# Patient Record
Sex: Male | Born: 1950
Health system: Southern US, Community
[De-identification: ages and names within clinical notes are randomized; demographics above are authoritative.]

## PROBLEM LIST (undated history)

## (undated) DIAGNOSIS — R251 Tremor, unspecified: Secondary | ICD-10-CM

## (undated) DIAGNOSIS — E785 Hyperlipidemia, unspecified: Secondary | ICD-10-CM

## (undated) DIAGNOSIS — N182 Chronic kidney disease, stage 2 (mild): Secondary | ICD-10-CM

## (undated) DIAGNOSIS — I129 Hypertensive chronic kidney disease with stage 1 through stage 4 chronic kidney disease, or unspecified chronic kidney disease: Secondary | ICD-10-CM

## (undated) DIAGNOSIS — I10 Essential (primary) hypertension: Secondary | ICD-10-CM

## (undated) DIAGNOSIS — M19049 Primary osteoarthritis, unspecified hand: Secondary | ICD-10-CM

## (undated) HISTORY — DX: Essential (primary) hypertension: I10

## (undated) HISTORY — PX: EYE SURGERY: SHX253

## (undated) HISTORY — DX: Chronic kidney disease, stage 2 (mild): N18.2

## (undated) HISTORY — DX: Hyperlipidemia, unspecified: E78.5

## (undated) HISTORY — DX: Primary osteoarthritis, unspecified hand: M19.049

## (undated) HISTORY — DX: Hypertensive chronic kidney disease with stage 1 through stage 4 chronic kidney disease, or unspecified chronic kidney disease: I12.9

---

## 2010-10-21 ENCOUNTER — Ambulatory Visit: Payer: Self-pay | Admitting: Gastroenterology

## 2010-10-23 LAB — PATHOLOGY REPORT

## 2013-02-02 ENCOUNTER — Ambulatory Visit: Payer: Self-pay | Admitting: General Practice

## 2013-04-07 ENCOUNTER — Ambulatory Visit: Payer: Self-pay | Admitting: General Practice

## 2013-05-19 DIAGNOSIS — H539 Unspecified visual disturbance: Secondary | ICD-10-CM | POA: Diagnosis not present

## 2013-05-19 DIAGNOSIS — I10 Essential (primary) hypertension: Secondary | ICD-10-CM | POA: Diagnosis not present

## 2013-05-19 DIAGNOSIS — E785 Hyperlipidemia, unspecified: Secondary | ICD-10-CM | POA: Diagnosis not present

## 2013-06-13 DIAGNOSIS — H43819 Vitreous degeneration, unspecified eye: Secondary | ICD-10-CM | POA: Diagnosis not present

## 2013-08-25 DIAGNOSIS — Z1331 Encounter for screening for depression: Secondary | ICD-10-CM | POA: Diagnosis not present

## 2013-08-25 DIAGNOSIS — Z125 Encounter for screening for malignant neoplasm of prostate: Secondary | ICD-10-CM | POA: Diagnosis not present

## 2013-08-25 DIAGNOSIS — I10 Essential (primary) hypertension: Secondary | ICD-10-CM | POA: Diagnosis not present

## 2013-08-25 DIAGNOSIS — E785 Hyperlipidemia, unspecified: Secondary | ICD-10-CM | POA: Diagnosis not present

## 2013-08-29 DIAGNOSIS — Z125 Encounter for screening for malignant neoplasm of prostate: Secondary | ICD-10-CM | POA: Diagnosis not present

## 2013-08-29 DIAGNOSIS — E785 Hyperlipidemia, unspecified: Secondary | ICD-10-CM | POA: Diagnosis not present

## 2013-08-29 DIAGNOSIS — I10 Essential (primary) hypertension: Secondary | ICD-10-CM | POA: Diagnosis not present

## 2014-06-30 DIAGNOSIS — H40003 Preglaucoma, unspecified, bilateral: Secondary | ICD-10-CM | POA: Diagnosis not present

## 2014-09-05 DIAGNOSIS — Z125 Encounter for screening for malignant neoplasm of prostate: Secondary | ICD-10-CM | POA: Diagnosis not present

## 2014-09-05 DIAGNOSIS — E785 Hyperlipidemia, unspecified: Secondary | ICD-10-CM | POA: Diagnosis not present

## 2014-09-05 DIAGNOSIS — R251 Tremor, unspecified: Secondary | ICD-10-CM | POA: Diagnosis not present

## 2014-09-05 DIAGNOSIS — I10 Essential (primary) hypertension: Secondary | ICD-10-CM | POA: Diagnosis not present

## 2014-10-03 DIAGNOSIS — R251 Tremor, unspecified: Secondary | ICD-10-CM | POA: Diagnosis not present

## 2014-10-03 DIAGNOSIS — G4700 Insomnia, unspecified: Secondary | ICD-10-CM | POA: Insufficient documentation

## 2014-10-03 DIAGNOSIS — G250 Essential tremor: Secondary | ICD-10-CM | POA: Insufficient documentation

## 2014-10-03 DIAGNOSIS — G479 Sleep disorder, unspecified: Secondary | ICD-10-CM | POA: Diagnosis not present

## 2015-04-03 DIAGNOSIS — Z23 Encounter for immunization: Secondary | ICD-10-CM | POA: Diagnosis not present

## 2015-04-03 DIAGNOSIS — Z0000 Encounter for general adult medical examination without abnormal findings: Secondary | ICD-10-CM | POA: Diagnosis not present

## 2015-04-04 DIAGNOSIS — R251 Tremor, unspecified: Secondary | ICD-10-CM | POA: Diagnosis not present

## 2015-04-04 DIAGNOSIS — G479 Sleep disorder, unspecified: Secondary | ICD-10-CM | POA: Diagnosis not present

## 2015-04-09 ENCOUNTER — Encounter: Payer: Self-pay | Admitting: Family Medicine

## 2015-04-09 ENCOUNTER — Ambulatory Visit (INDEPENDENT_AMBULATORY_CARE_PROVIDER_SITE_OTHER): Payer: Medicare Other | Admitting: Family Medicine

## 2015-04-09 VITALS — BP 130/80 | HR 64 | Resp 16 | Ht 73.0 in | Wt 177.0 lb

## 2015-04-09 DIAGNOSIS — R251 Tremor, unspecified: Secondary | ICD-10-CM

## 2015-04-09 DIAGNOSIS — Z23 Encounter for immunization: Secondary | ICD-10-CM | POA: Diagnosis not present

## 2015-04-09 DIAGNOSIS — I10 Essential (primary) hypertension: Secondary | ICD-10-CM

## 2015-04-09 DIAGNOSIS — G479 Sleep disorder, unspecified: Secondary | ICD-10-CM

## 2015-04-09 DIAGNOSIS — N183 Chronic kidney disease, stage 3 unspecified: Secondary | ICD-10-CM | POA: Insufficient documentation

## 2015-04-09 DIAGNOSIS — I129 Hypertensive chronic kidney disease with stage 1 through stage 4 chronic kidney disease, or unspecified chronic kidney disease: Secondary | ICD-10-CM | POA: Insufficient documentation

## 2015-04-09 MED ORDER — HYDROCHLOROTHIAZIDE 25 MG PO TABS: 25.0000 mg | ORAL_TABLET | Freq: Every day | ORAL | Status: DC

## 2015-04-09 NOTE — Progress Notes (Signed)
Name: Jesse Figueroa   MRN: [id number]    DOB: 14-Feb-1951   Date:04/09/2015       Progress Note  Subjective  Chief Complaint  Chief Complaint  Patient presents with  . Establish Care    HPI  Here to establish care.  Has HBP.  Also with insomnia and tremor.  Sees Dr. Melrose Nakayama for these and  Takes Gabapentin and Trazodone from him. No problem-specific assessment & plan notes found for this encounter.   Past Medical History  Diagnosis Date  . Hyperlipidemia   . Hypertension     History reviewed. No pertinent past surgical history.  Family History  Problem Relation Age of Onset  . Heart disease Mother   . Hypertension Mother   . Cancer Father     Social History   Social History  . Marital Status: Married    Spouse Name: N/A  . Number of Children: N/A  . Years of Education: N/A   Occupational History  . Not on file.   Social History Main Topics  . Smoking status: Never Smoker   . Smokeless tobacco: Never Used  . Alcohol Use: No  . Drug Use: No  . Sexual Activity: Not on file   Other Topics Concern  . Not on file   Social History Narrative  . No narrative on file     Current outpatient prescriptions:  .  gabapentin (NEURONTIN) 100 MG capsule, Takes 1 three times a dayh., Disp: , Rfl: 3 .  hydrochlorothiazide (HYDRODIURIL) 25 MG tablet, , Disp: , Rfl:  .  traZODone (DESYREL) 50 MG tablet, Takes 1 at bedtime, Disp: , Rfl: 3  No Known Allergies   Review of Systems  Constitutional: Negative for fever, chills, weight loss and malaise/fatigue.  HENT: Negative for hearing loss.   Eyes: Negative for blurred vision and double vision.  Respiratory: Negative for cough, shortness of breath and wheezing.   Cardiovascular: Negative for chest pain, palpitations, orthopnea and leg swelling.  Gastrointestinal: Negative for heartburn, abdominal pain and blood in stool.  Genitourinary: Negative for dysuria, urgency and frequency.  Musculoskeletal: Negative for myalgias  and joint pain.  Skin: Negative for rash.  Neurological: Positive for tremors. Negative for dizziness, weakness and headaches.      Objective  Filed Vitals:   04/09/15 1019 04/09/15 1041  BP: 137/83 130/80  Pulse: 64   Resp: 16   Height: 6\' 1"  (1.854 m)   Weight: 177 lb (80.287 kg)     Physical Exam  Constitutional: He is oriented to person, place, and time and well-developed, well-nourished, and in no distress. No distress.  HENT:  Head: Normocephalic and atraumatic.  Eyes: Conjunctivae and EOM are normal. Pupils are equal, round, and reactive to light. No scleral icterus.  Neck: Normal range of motion. Neck supple. Carotid bruit is not present. No thyromegaly present.  Cardiovascular: Normal rate, regular rhythm, normal heart sounds and intact distal pulses.  Exam reveals no gallop and no friction rub.   No murmur heard. Pulmonary/Chest: Effort normal and breath sounds normal. No respiratory distress. He has no wheezes. He has no rales.  Abdominal: Soft. Bowel sounds are normal. He exhibits no distension and no mass. There is no tenderness.  Musculoskeletal: Normal range of motion. He exhibits no edema.  Lymphadenopathy:    He has no cervical adenopathy.  Neurological: He is alert and oriented to person, place, and time.  Vitals reviewed.      No results found for this or any  previous visit (from the past 2160 hour(s)).   Assessment & Plan  Problem List Items Addressed This Visit      Cardiovascular and Mediastinum   Hypertension - Primary   Relevant Medications   hydrochlorothiazide (HYDRODIURIL) 25 MG tablet   Other Relevant Orders   Comprehensive Metabolic Panel (CMET)   Lipid Profile   CBC with Differential     Other   Difficulty sleeping   Has a tremor   Immunization due   Relevant Orders   Pneumococcal conjugate vaccine 13-valent      Meds ordered this encounter  Medications  . gabapentin (NEURONTIN) 100 MG capsule    Sig: Takes 1 three times  a dayh.    Refill:  3  . traZODone (DESYREL) 50 MG tablet    Sig: Takes 1 at bedtime    Refill:  3  . hydrochlorothiazide (HYDRODIURIL) 25 MG tablet    Sig:    1. Essential hypertension  - Comprehensive Metabolic Panel (CMET) - Lipid Profile - CBC with Differential  2. Difficulty sleeping   3. Has a tremor   4. Immunization due  - Pneumococcal conjugate vaccine 13-valent

## 2015-04-20 DIAGNOSIS — I10 Essential (primary) hypertension: Secondary | ICD-10-CM | POA: Diagnosis not present

## 2015-04-20 DIAGNOSIS — R7309 Other abnormal glucose: Secondary | ICD-10-CM | POA: Diagnosis not present

## 2015-04-20 LAB — CBC WITH DIFFERENTIAL/PLATELET
Basophils Absolute: 0.1 10*3/uL (ref 0.0–0.2)
Basos: 1 %
EOS (ABSOLUTE): 0.3 10*3/uL (ref 0.0–0.4)
Eos: 6 %
Hematocrit: 43.3 % (ref 37.5–51.0)
Hemoglobin: 14.6 g/dL (ref 12.6–17.7)
Immature Grans (Abs): 0 10*3/uL (ref 0.0–0.1)
Immature Granulocytes: 0 %
Lymphocytes Absolute: 2.3 10*3/uL (ref 0.7–3.1)
Lymphs: 44 %
MCH: 27.8 pg (ref 26.6–33.0)
MCHC: 33.7 g/dL (ref 31.5–35.7)
MCV: 83 fL (ref 79–97)
Monocytes Absolute: 0.5 10*3/uL (ref 0.1–0.9)
Monocytes: 9 %
Neutrophils Absolute: 2.1 10*3/uL (ref 1.4–7.0)
Neutrophils: 40 %
Platelets: 327 10*3/uL (ref 150–379)
RBC: 5.25 x10E6/uL (ref 4.14–5.80)
RDW: 14.8 % (ref 12.3–15.4)
WBC: 5.2 10*3/uL (ref 3.4–10.8)

## 2015-04-21 LAB — COMPREHENSIVE METABOLIC PANEL
ALT: 33 IU/L (ref 0–44)
AST: 33 IU/L (ref 0–40)
Albumin/Globulin Ratio: 1.6 (ref 1.1–2.5)
Albumin: 4.6 g/dL (ref 3.6–4.8)
Alkaline Phosphatase: 89 IU/L (ref 39–117)
BUN/Creatinine Ratio: 9 — ABNORMAL LOW (ref 10–22)
BUN: 11 mg/dL (ref 8–27)
Bilirubin Total: 0.7 mg/dL (ref 0.0–1.2)
CO2: 28 mmol/L (ref 18–29)
Calcium: 10.1 mg/dL (ref 8.6–10.2)
Chloride: 97 mmol/L (ref 97–106)
Creatinine, Ser: 1.28 mg/dL — ABNORMAL HIGH (ref 0.76–1.27)
GFR calc Af Amer: 67 mL/min/{1.73_m2} (ref >59)
GFR calc non Af Amer: 58 mL/min/{1.73_m2} — ABNORMAL LOW (ref >59)
Globulin, Total: 2.8 g/dL (ref 1.5–4.5)
Glucose: 108 mg/dL — ABNORMAL HIGH (ref 65–99)
Potassium: 3.9 mmol/L (ref 3.5–5.2)
Sodium: 141 mmol/L (ref 136–144)
Total Protein: 7.4 g/dL (ref 6.0–8.5)

## 2015-04-21 LAB — LIPID PANEL
Chol/HDL Ratio: 4.4 ratio units (ref 0.0–5.0)
Cholesterol, Total: 218 mg/dL — ABNORMAL HIGH (ref 100–199)
HDL: 50 mg/dL (ref >39)
LDL Calculated: 141 mg/dL — ABNORMAL HIGH (ref 0–99)
Triglycerides: 135 mg/dL (ref 0–149)

## 2015-04-25 LAB — SPECIMEN STATUS REPORT

## 2015-04-25 LAB — HGB A1C W/O EAG: Hgb A1c MFr Bld: 6.6 % — ABNORMAL HIGH (ref 4.8–5.6)

## 2015-05-08 DIAGNOSIS — R251 Tremor, unspecified: Secondary | ICD-10-CM | POA: Diagnosis not present

## 2015-05-08 DIAGNOSIS — G479 Sleep disorder, unspecified: Secondary | ICD-10-CM | POA: Diagnosis not present

## 2015-05-09 ENCOUNTER — Encounter: Payer: Self-pay | Admitting: Urgent Care

## 2015-05-09 ENCOUNTER — Emergency Department
Admission: EM | Admit: 2015-05-09 | Discharge: 2015-05-09 | Disposition: A | Discharge status: Home / Self Care | Payer: Medicare Other | Attending: Emergency Medicine | Admitting: Emergency Medicine

## 2015-05-09 DIAGNOSIS — R197 Diarrhea, unspecified: Secondary | ICD-10-CM | POA: Insufficient documentation

## 2015-05-09 DIAGNOSIS — R200 Anesthesia of skin: Secondary | ICD-10-CM | POA: Diagnosis not present

## 2015-05-09 DIAGNOSIS — R202 Paresthesia of skin: Secondary | ICD-10-CM | POA: Insufficient documentation

## 2015-05-09 DIAGNOSIS — T426X5A Adverse effect of other antiepileptic and sedative-hypnotic drugs, initial encounter: Secondary | ICD-10-CM | POA: Diagnosis not present

## 2015-05-09 DIAGNOSIS — Z79899 Other long term (current) drug therapy: Secondary | ICD-10-CM | POA: Insufficient documentation

## 2015-05-09 DIAGNOSIS — R252 Cramp and spasm: Secondary | ICD-10-CM | POA: Insufficient documentation

## 2015-05-09 DIAGNOSIS — I10 Essential (primary) hypertension: Secondary | ICD-10-CM | POA: Diagnosis not present

## 2015-05-09 DIAGNOSIS — T50905A Adverse effect of unspecified drugs, medicaments and biological substances, initial encounter: Secondary | ICD-10-CM

## 2015-05-09 DIAGNOSIS — T438X5A Adverse effect of other psychotropic drugs, initial encounter: Secondary | ICD-10-CM | POA: Diagnosis not present

## 2015-05-09 HISTORY — DX: Tremor, unspecified: R25.1

## 2015-05-09 NOTE — ED Notes (Addendum)
Patient presents with a myriad of seemingly unrelated symptoms. Patient reports RUE numbness, numbness to right great toe, and loose stools over the last couple of days. Patient reporting that he recently started a new medication to which he is attributing the symptoms; new medication is Gabapentin; patient also taking HCTZ.

## 2015-05-09 NOTE — ED Notes (Addendum)
Pt c/o numbness tingling in the right arm X 3 weeks, and a cramp in the left great toe beginning tonight.  Pt believes this is due to being started on Gabapentin 3 weeks ago for a tremor in the right arm.  Pt reports that the Gabapentin did not resolve the tremor.  Pt reports he went to the neurologist (Dr. Melrose Nakayama) at [facility] clinic Tuesday morning and was told to stop taking the Gabapentin.  Dr Melrose Nakayama was informed of the numbness in the arm, but not the cramping in the toe during this visit.  Pt denies pain.  Pt reports nausea and increased defecation beginning yesterday.

## 2015-05-09 NOTE — ED Provider Notes (Signed)
[facility] Emergency Department Provider Note  ____________________________________________  Time seen: 2:30 AM  I have reviewed the triage vital signs and the nursing notes.   HISTORY  Chief Complaint Numbness     HPI Jesse Figueroa is a 64 y.o. male presents with multiple complaints patient admits to right arm numbness and tingling 3 weeks accompanied by a cramp in his left great toe which began tonight in addition patient admits to frequent loose stools. Patient reports that symptoms started following starting gabapentin for an essential tremor of his hands. Patient was prescribed gabapentin by Dr. Melrose Nakayama who discontinued it yesterday. Patient denies any chest pain no shortness of breath no headache or dizziness no gait instability or visual changes. Of note patient has no symptoms at present     Past Medical History  Diagnosis Date  . Hyperlipidemia   . Hypertension   . Tremor     Patient Active Problem List   Diagnosis Date Noted  . Hypertension 04/09/2015  . Immunization due 04/09/2015  . Difficulty sleeping 10/03/2014  . Has a tremor 10/03/2014    History reviewed. No pertinent past surgical history.  Current Outpatient Rx  Name  Route  Sig  Dispense  Refill  . gabapentin (NEURONTIN) 100 MG capsule      Takes 1 three times a dayh.      3   . hydrochlorothiazide (HYDRODIURIL) 25 MG tablet   Oral   Take 1 tablet (25 mg total) by mouth daily.   90 tablet   3   . traZODone (DESYREL) 50 MG tablet      Takes 1 at bedtime      3     Allergies Review of patient's allergies indicates no known allergies.  Family History  Problem Relation Age of Onset  . Heart disease Mother   . Hypertension Mother   . Cancer Father     Social History Social History  Substance Use Topics  . Smoking status: Never Smoker   . Smokeless tobacco: Never Used  . Alcohol Use: No    Review of Systems  Constitutional: Negative for  fever. Eyes: Negative for visual changes. ENT: Negative for sore throat. Cardiovascular: Negative for chest pain. Respiratory: Negative for shortness of breath. Gastrointestinal: Negative for abdominal pain, vomiting and diarrhea. Genitourinary: Negative for dysuria. Musculoskeletal: Negative for back pain. Skin: Negative for rash. Neurological: Right arm numbness and tingling   10-point ROS otherwise negative.  ____________________________________________   PHYSICAL EXAM:  VITAL SIGNS: ED Triage Vitals  Enc Vitals Group     BP 05/09/15 0042 154/91 mmHg     Pulse Rate 05/09/15 0042 61     Resp 05/09/15 0042 16     Temp 05/09/15 0042 98 F (36.7 C)     Temp Source 05/09/15 0042 Oral     SpO2 05/09/15 0042 97 %     Weight 05/09/15 0042 179 lb (81.194 kg)     Height 05/09/15 0042 6\' 1"  (1.854 m)     Head Cir --      Peak Flow --      Pain Score 05/09/15 0043 0     Pain Loc --      Pain Edu? --      Excl. in [place]? --      Constitutional: Alert and oriented. Well appearing and in no distress. Eyes: Conjunctivae are normal. PERRL. Normal extraocular movements. ENT   Head: Normocephalic and atraumatic.   Nose: No congestion/rhinnorhea.  Mouth/Throat: Mucous membranes are moist.   Neck: No stridor. Hematological/Lymphatic/Immunilogical: No cervical lymphadenopathy. Cardiovascular: Normal rate, regular rhythm. Normal and symmetric distal pulses are present in all extremities. No murmurs, rubs, or gallops. Respiratory: Normal respiratory effort without tachypnea nor retractions. Breath sounds are clear and equal bilaterally. No wheezes/rales/rhonchi. Gastrointestinal: Soft and nontender. No distention. There is no CVA tenderness. Genitourinary: deferred Musculoskeletal: Nontender with normal range of motion in all extremities. No joint effusions.  No lower extremity tenderness nor edema. Neurologic:  Normal speech and language. No gross focal neurologic deficits  are appreciated. Speech is normal.  Skin:  Skin is warm, dry and intact. No rash noted. Psychiatric: Mood and affect are normal. Speech and behavior are normal. Patient exhibits appropriate insight and judgment.    EKG  ED ECG REPORT I, Martine Bleecker, [place], the attending physician, personally viewed and interpreted this ECG.   Date: 05/11/2015  EKG Time: 12:45 AM  Rate: 61  Rhythm: Normal sinus rhythm  Axis: None  Intervals: Normal  ST&T Change: None      INITIAL IMPRESSION / ASSESSMENT AND PLAN / ED COURSE  Pertinent labs & imaging results that were available during my care of the patient were reviewed by me and considered in my medical decision making (see chart for details).  History of physical exam consistent with possible side effect of gabapentin. I reviewed the side effects of gabapentin with the patient and recommended ____________________________________________   FINAL CLINICAL IMPRESSION(S) / ED DIAGNOSES  Final diagnoses:  Medication side effect, initial encounter      Gregor Hams, MD 05/11/15 [id number]

## 2015-05-09 NOTE — ED Notes (Signed)
Spoke with Owens Shark, MD regarding presenting c/o and triage assessment. MD with orders for EKG only at this time.

## 2015-05-29 DIAGNOSIS — G479 Sleep disorder, unspecified: Secondary | ICD-10-CM | POA: Diagnosis not present

## 2015-05-29 DIAGNOSIS — R251 Tremor, unspecified: Secondary | ICD-10-CM | POA: Diagnosis not present

## 2015-07-09 ENCOUNTER — Ambulatory Visit (INDEPENDENT_AMBULATORY_CARE_PROVIDER_SITE_OTHER): Payer: Medicare Other | Admitting: Family Medicine

## 2015-07-09 ENCOUNTER — Encounter: Payer: Self-pay | Admitting: Family Medicine

## 2015-07-09 VITALS — BP 135/85 | HR 59 | Temp 98.0°F | Resp 16 | Ht 71.0 in | Wt 178.0 lb

## 2015-07-09 DIAGNOSIS — E119 Type 2 diabetes mellitus without complications: Secondary | ICD-10-CM

## 2015-07-09 DIAGNOSIS — E785 Hyperlipidemia, unspecified: Secondary | ICD-10-CM | POA: Insufficient documentation

## 2015-07-09 DIAGNOSIS — I10 Essential (primary) hypertension: Secondary | ICD-10-CM | POA: Diagnosis not present

## 2015-07-09 DIAGNOSIS — R251 Tremor, unspecified: Secondary | ICD-10-CM | POA: Diagnosis not present

## 2015-07-09 MED ORDER — ATORVASTATIN CALCIUM 10 MG PO TABS: 10.0000 mg | ORAL_TABLET | Freq: Every day | ORAL | Status: DC

## 2015-07-09 MED ORDER — LOSARTAN POTASSIUM 50 MG PO TABS: 50.0000 mg | ORAL_TABLET | Freq: Every day | ORAL | Status: DC

## 2015-07-09 NOTE — Progress Notes (Signed)
Name: Jesse Figueroa   MRN: [id number]    DOB: 02/19/1951   Date:07/09/2015       Progress Note  Subjective  Chief Complaint  Chief Complaint  Patient presents with  . Hypertension  . Hyperlipidemia  . Diabetes    HPI  Here for f/u of HBP.  Also with elevated cholesterol in past.  Total Chol of 218 and LDL of 141.  Has benign tremor.  Recently started on Propranolol for this.  He c/o frequent BMs on Propranolol.  Was found to be diabetic in Nov. Of 2016 with BS of 108 and A1c of 6.6.     No problem-specific assessment & plan notes found for this encounter.   Past Medical History  Diagnosis Date  . Hyperlipidemia   . Hypertension   . Tremor     History reviewed. No pertinent past surgical history.  Family History  Problem Relation Age of Onset  . Heart disease Mother   . Hypertension Mother   . Cancer Father     Social History   Social History  . Marital Status: Married    Spouse Name: N/A  . Number of Children: N/A  . Years of Education: N/A   Occupational History  . Not on file.   Social History Main Topics  . Smoking status: Never Smoker   . Smokeless tobacco: Never Used  . Alcohol Use: No  . Drug Use: No  . Sexual Activity: Not on file   Other Topics Concern  . Not on file   Social History Narrative     Current outpatient prescriptions:  .  propranolol ER (INDERAL LA) 60 MG 24 hr capsule, Take 60 mg by mouth daily., Disp: , Rfl: 5 .  atorvastatin (LIPITOR) 10 MG tablet, Take 1 tablet (10 mg total) by mouth daily at 6 PM., Disp: 30 tablet, Rfl: 6 .  losartan (COZAAR) 50 MG tablet, Take 1 tablet (50 mg total) by mouth daily., Disp: 30 tablet, Rfl: 6  Not on File   Review of Systems  Constitutional: Negative for fever, chills, weight loss and malaise/fatigue.  HENT: Negative for hearing loss.   Eyes: Negative for blurred vision and double vision.  Respiratory: Negative for cough, shortness of breath and wheezing.   Cardiovascular: Negative for  chest pain, palpitations and leg swelling.  Gastrointestinal: Negative for heartburn, abdominal pain and blood in stool.       Complains of frequent BMs (3-5/day).  Genitourinary: Negative for dysuria, urgency and frequency.  Musculoskeletal: Negative for myalgias and joint pain.  Skin: Negative for rash.  Neurological: Positive for tremors. Negative for dizziness, weakness and headaches.      Objective  Filed Vitals:   07/09/15 0909 07/09/15 0934  BP: 145/90 135/85  Pulse: 59   Temp: 98 F (36.7 C)   TempSrc: Oral   Resp: 16   Height: 5\' 11"  (1.803 m)   Weight: 178 lb (80.74 kg)     Physical Exam  Constitutional: He is oriented to person, place, and time and well-developed, well-nourished, and in no distress. No distress.  HENT:  Head: Normocephalic and atraumatic.  Eyes: Conjunctivae and EOM are normal. Pupils are equal, round, and reactive to light.  Neck: Normal range of motion. Neck supple. No thyromegaly present.  Cardiovascular: Normal rate, regular rhythm and normal heart sounds.  Exam reveals no gallop and no friction rub.   No murmur heard. Pulmonary/Chest: Effort normal and breath sounds normal. No respiratory distress. He has no wheezes.  He has no rales.  Abdominal: Soft. Bowel sounds are normal. He exhibits no distension and no mass. There is no tenderness.  Musculoskeletal: He exhibits no edema.  Lymphadenopathy:    He has no cervical adenopathy.  Neurological: He is alert and oriented to person, place, and time.  Mild tremor of hands  Vitals reviewed.      Recent Results (from the past 2160 hour(s))  Comprehensive Metabolic Panel (CMET)     Status: Abnormal   Collection Time: 04/20/15  8:02 AM  Result Value Ref Range   Glucose 108 (H) 65 - 99 mg/dL   BUN 11 8 - 27 mg/dL   Creatinine, Ser 1.28 (H) 0.76 - 1.27 mg/dL   GFR calc non Af Amer 58 (L) >59 mL/min/1.73   GFR calc Af Amer 67 >59 mL/min/1.73   BUN/Creatinine Ratio 9 (L) 10 - 22   Sodium 141  136 - 144 mmol/L   Potassium 3.9 3.5 - 5.2 mmol/L   Chloride 97 97 - 106 mmol/L   CO2 28 18 - 29 mmol/L   Calcium 10.1 8.6 - 10.2 mg/dL   Total Protein 7.4 6.0 - 8.5 g/dL   Albumin 4.6 3.6 - 4.8 g/dL   Globulin, Total 2.8 1.5 - 4.5 g/dL   Albumin/Globulin Ratio 1.6 1.1 - 2.5   Bilirubin Total 0.7 0.0 - 1.2 mg/dL   Alkaline Phosphatase 89 39 - 117 IU/L   AST 33 0 - 40 IU/L   ALT 33 0 - 44 IU/L  Lipid Profile     Status: Abnormal   Collection Time: 04/20/15  8:02 AM  Result Value Ref Range   Cholesterol, Total 218 (H) 100 - 199 mg/dL   Triglycerides 135 0 - 149 mg/dL   HDL 50 >39 mg/dL   LDL Calculated 141 (H) 0 - 99 mg/dL   Chol/HDL Ratio 4.4 0.0 - 5.0 ratio units  CBC with Differential     Status: None   Collection Time: 04/20/15  8:02 AM  Result Value Ref Range   WBC 5.2 3.4 - 10.8 x10E3/uL   RBC 5.25 4.14 - 5.80 x10E6/uL   Hemoglobin 14.6 12.6 - 17.7 g/dL   Hematocrit 43.3 37.5 - 51.0 %   MCV 83 79 - 97 fL   MCH 27.8 26.6 - 33.0 pg   MCHC 33.7 31.5 - 35.7 g/dL   RDW 14.8 12.3 - 15.4 %   Platelets 327 150 - 379 x10E3/uL   Neutrophils 40 %   Lymphs 44 %   Monocytes 9 %   Eos 6 %   Basos 1 %   Neutrophils Absolute 2.1 1.4 - 7.0 x10E3/uL   Lymphocytes Absolute 2.3 0.7 - 3.1 x10E3/uL   Monocytes Absolute 0.5 0.1 - 0.9 x10E3/uL   EOS (ABSOLUTE) 0.3 0.0 - 0.4 x10E3/uL   Basophils Absolute 0.1 0.0 - 0.2 x10E3/uL   Immature Granulocytes 0 %   Immature Grans (Abs) 0.0 0.0 - 0.1 x10E3/uL  Hgb A1c w/o eAG     Status: Abnormal   Collection Time: 04/20/15  8:02 AM  Result Value Ref Range   Hgb A1c MFr Bld 6.6 (H) 4.8 - 5.6 %    Comment:          Pre-diabetes: 5.7 - 6.4          Diabetes: >6.4          Glycemic control for adults with diabetes: <7.0   Specimen status report     Status: None   Collection  Time: 04/20/15  8:02 AM  Result Value Ref Range   specimen status report Comment     Comment: Written Authorization Written Authorization Written Authorization  Received. Authorization received from [facility] 04-24-2015 Logged by [place]  Problem List Items Addressed This Visit      Cardiovascular and Mediastinum   Hypertension - Primary   Relevant Medications   propranolol ER (INDERAL LA) 60 MG 24 hr capsule   losartan (COZAAR) 50 MG tablet   atorvastatin (LIPITOR) 10 MG tablet     Endocrine   Diabetes (HCC)   Relevant Medications   losartan (COZAAR) 50 MG tablet   atorvastatin (LIPITOR) 10 MG tablet     Other   Hyperlipidemia   Relevant Medications   propranolol ER (INDERAL LA) 60 MG 24 hr capsule   losartan (COZAAR) 50 MG tablet   atorvastatin (LIPITOR) 10 MG tablet      Meds ordered this encounter  Medications  . propranolol ER (INDERAL LA) 60 MG 24 hr capsule    Sig: Take 60 mg by mouth daily.    Refill:  5  . DISCONTD: traZODone (DESYREL) 50 MG tablet    Sig: Take 50 mg by mouth at bedtime.  Marland Kitchen losartan (COZAAR) 50 MG tablet    Sig: Take 1 tablet (50 mg total) by mouth daily.    Dispense:  30 tablet    Refill:  6  . atorvastatin (LIPITOR) 10 MG tablet    Sig: Take 1 tablet (10 mg total) by mouth daily at 6 PM.    Dispense:  30 tablet    Refill:  6   1. Essential hypertension  - losartan (COZAAR) 50 MG tablet; Take 1 tablet (50 mg total) by mouth daily.  Dispense: 30 tablet; Refill: 6  2. Hyperlipidemia  - atorvastatin (LIPITOR) 10 MG tablet; Take 1 tablet (10 mg total) by mouth daily at 6 PM.  Dispense: 30 tablet; Refill: 6  3. Type 2 diabetes mellitus without complication, without long-term current use of insulin (HCC) Decrease sugars and starches in diet.  4. Has a tremor Cont. Propranolol

## 2015-09-11 ENCOUNTER — Telehealth: Payer: Self-pay | Admitting: Family Medicine

## 2015-09-11 NOTE — Telephone Encounter (Signed)
Please suggest option ?

## 2015-09-11 NOTE — Telephone Encounter (Signed)
Propranolol works better for tremor than Atenolol or Carvedilol.  If cost of Propranolol is too high, can try one of the other ones.  Just let me know.

## 2015-09-11 NOTE — Telephone Encounter (Signed)
[employer] told pt that atenolol or carvedilol may be lower cost options than propranolol and asked if he could try one of these.  His call back numbers are [phone] or [phone]

## 2015-09-11 NOTE — Telephone Encounter (Signed)
Pt advised as per Dr. Luan Pulling and  He wants to stay with Propranolol.

## 2015-10-15 ENCOUNTER — Ambulatory Visit (INDEPENDENT_AMBULATORY_CARE_PROVIDER_SITE_OTHER): Payer: Medicare Other | Admitting: Family Medicine

## 2015-10-15 ENCOUNTER — Encounter: Payer: Self-pay | Admitting: Family Medicine

## 2015-10-15 VITALS — BP 120/80 | HR 48 | Temp 98.0°F | Resp 16 | Ht 71.0 in | Wt 172.0 lb

## 2015-10-15 DIAGNOSIS — E785 Hyperlipidemia, unspecified: Secondary | ICD-10-CM

## 2015-10-15 DIAGNOSIS — I10 Essential (primary) hypertension: Secondary | ICD-10-CM | POA: Diagnosis not present

## 2015-10-15 DIAGNOSIS — E119 Type 2 diabetes mellitus without complications: Secondary | ICD-10-CM

## 2015-10-15 DIAGNOSIS — R251 Tremor, unspecified: Secondary | ICD-10-CM

## 2015-10-15 DIAGNOSIS — R7303 Prediabetes: Secondary | ICD-10-CM | POA: Insufficient documentation

## 2015-10-15 LAB — POCT GLYCOSYLATED HEMOGLOBIN (HGB A1C): Hemoglobin A1C: 6.5

## 2015-10-15 MED ORDER — PROPRANOLOL HCL 20 MG PO TABS: 20.0000 mg | ORAL_TABLET | Freq: Two times a day (BID) | ORAL | Status: DC

## 2015-10-15 NOTE — Progress Notes (Signed)
Name: Jesse Figueroa   MRN: [id number]    DOB: [birth year]   Date:10/15/2015       Progress Note  Subjective  Chief Complaint  Chief Complaint  Patient presents with  . Hypertension    HPI  Here for f/u of HBP.  Also with elevated lipids.  Has chronic insomnia.  Also with benign tremors.  Feeling pretty well overall.  HR is slow today.  Has early DM withg A1c of 6.6 , 5 mnths ago. No problem-specific assessment & plan notes found for this encounter.   Past Medical History  Diagnosis Date  . Hyperlipidemia   . Hypertension   . Tremor     History reviewed. No pertinent past surgical history.  Family History  Problem Relation Age of Onset  . Heart disease Mother   . Hypertension Mother   . Cancer Father     Social History   Social History  . Marital Status: Married    Spouse Name: N/A  . Number of Children: N/A  . Years of Education: N/A   Occupational History  . Not on file.   Social History Main Topics  . Smoking status: Never Smoker   . Smokeless tobacco: Never Used  . Alcohol Use: No  . Drug Use: No  . Sexual Activity: Not on file   Other Topics Concern  . Not on file   Social History Narrative     Current outpatient prescriptions:  .  atorvastatin (LIPITOR) 10 MG tablet, Take 1 tablet (10 mg total) by mouth daily at 6 PM., Disp: 30 tablet, Rfl: 6 .  losartan (COZAAR) 50 MG tablet, Take 1 tablet (50 mg total) by mouth daily., Disp: 30 tablet, Rfl: 6 .  traZODone (DESYREL) 50 MG tablet, Take 1 tablet by mouth at bedtime. Takes rare prn now., Disp: , Rfl:  .  propranolol (INDERAL) 20 MG tablet, Take 1 tablet (20 mg total) by mouth 2 (two) times daily., Disp: 60 tablet, Rfl: 6  No Known Allergies   Review of Systems  Constitutional: Negative for fever, chills, weight loss and malaise/fatigue.  HENT: Negative for hearing loss.   Eyes: Negative for blurred vision and double vision.  Respiratory: Negative for cough, shortness of breath and wheezing.    Cardiovascular: Negative for chest pain, palpitations and leg swelling.  Gastrointestinal: Negative for heartburn, abdominal pain and blood in stool.  Genitourinary: Negative for dysuria, urgency and frequency.       ED  Musculoskeletal: Negative for myalgias and joint pain.  Skin: Negative for rash.  Neurological: Positive for tremors (mild). Negative for weakness and headaches.  Psychiatric/Behavioral: The patient does not have insomnia.       Objective  Filed Vitals:   10/15/15 0808 10/15/15 0831  BP: 124/87 120/80  Pulse: 49 48  Temp: 98 F (36.7 C)   TempSrc: Oral   Resp: 16   Height: 5\' 11"  (1.803 m)   Weight: 172 lb (78.019 kg)     Physical Exam  Constitutional: He is oriented to person, place, and time and well-developed, well-nourished, and in no distress. No distress.  HENT:  Head: Normocephalic and atraumatic.  Eyes: Conjunctivae and EOM are normal. Pupils are equal, round, and reactive to light. No scleral icterus.  Neck: Normal range of motion. Neck supple. Carotid bruit is not present. No thyromegaly present.  Cardiovascular: Regular rhythm and normal heart sounds.  Bradycardia present.  Exam reveals no gallop and no friction rub.   No murmur heard. Pulmonary/Chest:  Effort normal and breath sounds normal. No respiratory distress. He has no wheezes. He has no rales.  Musculoskeletal: He exhibits no edema.  Lymphadenopathy:    He has no cervical adenopathy.  Neurological: He is alert and oriented to person, place, and time.  Mild hand tremor bilaterally.  Vitals reviewed.      No results found for this or any previous visit (from the past 2160 hour(s)).   Assessment & Plan  Problem List Items Addressed This Visit      Cardiovascular and Mediastinum   Hypertension - Primary   Relevant Medications   propranolol (INDERAL) 20 MG tablet     Endocrine   Uncomplicated type 2 diabetes mellitus (HCC)   Relevant Orders   POCT HgB A1C     Other   Has  a tremor   Relevant Medications   propranolol (INDERAL) 20 MG tablet   Hyperlipidemia   Relevant Medications   propranolol (INDERAL) 20 MG tablet   Other Relevant Orders   Lipid Profile   Comprehensive Metabolic Panel (CMET)      Meds ordered this encounter  Medications  . traZODone (DESYREL) 50 MG tablet    Sig: Take 1 tablet by mouth at bedtime. Takes rare prn now.  . propranolol (INDERAL) 20 MG tablet    Sig: Take 1 tablet (20 mg total) by mouth 2 (two) times daily.    Dispense:  60 tablet    Refill:  6   1. Essential hypertension Cont Losartan  2. Type 2 diabetes mellitus without complication, without long-term current use of insulin (HCC)  - POCT HgB A1C-6.5  3. Hyperlipidemia Cont. Lipitor - Lipid Profile - Comprehensive Metabolic Panel (CMET)  4. Has a tremor  - propranolol (INDERAL) 20 MG tablet; Take 1 tablet (20 mg total) by mouth 2 (two) times daily.  Dispense: 60 tablet; Refill: 6  -decreased from 60 mg/d.

## 2015-10-16 LAB — COMPREHENSIVE METABOLIC PANEL
ALT: 42 IU/L (ref 0–44)
AST: 34 IU/L (ref 0–40)
Albumin/Globulin Ratio: 1.8 (ref 1.2–2.2)
Albumin: 4.2 g/dL (ref 3.6–4.8)
Alkaline Phosphatase: 106 IU/L (ref 39–117)
BUN/Creatinine Ratio: 10 (ref 10–24)
BUN: 11 mg/dL (ref 8–27)
Bilirubin Total: 0.6 mg/dL (ref 0.0–1.2)
CO2: 25 mmol/L (ref 18–29)
Calcium: 9.2 mg/dL (ref 8.6–10.2)
Chloride: 102 mmol/L (ref 96–106)
Creatinine, Ser: 1.11 mg/dL (ref 0.76–1.27)
GFR calc Af Amer: 79 mL/min/{1.73_m2} (ref >59)
GFR calc non Af Amer: 68 mL/min/{1.73_m2} (ref >59)
Globulin, Total: 2.4 g/dL (ref 1.5–4.5)
Glucose: 95 mg/dL (ref 65–99)
Potassium: 4.6 mmol/L (ref 3.5–5.2)
Sodium: 142 mmol/L (ref 134–144)
Total Protein: 6.6 g/dL (ref 6.0–8.5)

## 2015-10-16 LAB — LIPID PANEL
Chol/HDL Ratio: 2.6 ratio units (ref 0.0–5.0)
Cholesterol, Total: 121 mg/dL (ref 100–199)
HDL: 47 mg/dL (ref >39)
LDL Calculated: 57 mg/dL (ref 0–99)
Triglycerides: 84 mg/dL (ref 0–149)
VLDL Cholesterol Cal: 17 mg/dL (ref 5–40)

## 2015-11-16 DIAGNOSIS — Z1211 Encounter for screening for malignant neoplasm of colon: Secondary | ICD-10-CM | POA: Diagnosis not present

## 2015-12-04 ENCOUNTER — Encounter: Payer: Self-pay | Admitting: Family Medicine

## 2015-12-04 ENCOUNTER — Ambulatory Visit (INDEPENDENT_AMBULATORY_CARE_PROVIDER_SITE_OTHER): Payer: Medicare Other | Admitting: Family Medicine

## 2015-12-04 VITALS — BP 119/75 | HR 56 | Temp 98.0°F | Resp 16 | Ht 71.0 in | Wt 171.0 lb

## 2015-12-04 DIAGNOSIS — I10 Essential (primary) hypertension: Secondary | ICD-10-CM

## 2015-12-04 DIAGNOSIS — E785 Hyperlipidemia, unspecified: Secondary | ICD-10-CM

## 2015-12-04 DIAGNOSIS — E119 Type 2 diabetes mellitus without complications: Secondary | ICD-10-CM

## 2015-12-04 DIAGNOSIS — G479 Sleep disorder, unspecified: Secondary | ICD-10-CM | POA: Diagnosis not present

## 2015-12-04 DIAGNOSIS — R001 Bradycardia, unspecified: Secondary | ICD-10-CM | POA: Insufficient documentation

## 2015-12-04 NOTE — Progress Notes (Signed)
Name: Jesse Figueroa   MRN: [id number]    DOB: [birth year]   Date:12/04/2015       Progress Note  Subjective  Chief Complaint  Chief Complaint  Patient presents with  . decreased heart rate    HPI Here for f/u of bradycardia.  He also has just been diagnosed with diet controlled DM.  H is feeling well overall.    No problem-specific assessment & plan notes found for this encounter.   Past Medical History  Diagnosis Date  . Hyperlipidemia   . Hypertension   . Tremor     No past surgical history on file.  Family History  Problem Relation Age of Onset  . Heart disease Mother   . Hypertension Mother   . Cancer Father     Social History   Social History  . Marital Status: Married    Spouse Name: N/A  . Number of Children: N/A  . Years of Education: N/A   Occupational History  . Not on file.   Social History Main Topics  . Smoking status: Never Smoker   . Smokeless tobacco: Never Used  . Alcohol Use: No  . Drug Use: No  . Sexual Activity: Not on file   Other Topics Concern  . Not on file   Social History Narrative     Current outpatient prescriptions:  .  atorvastatin (LIPITOR) 10 MG tablet, Take 1 tablet (10 mg total) by mouth daily at 6 PM., Disp: 30 tablet, Rfl: 6 .  losartan (COZAAR) 50 MG tablet, Take 1 tablet (50 mg total) by mouth daily., Disp: 30 tablet, Rfl: 6 .  propranolol (INDERAL) 20 MG tablet, Take 1 tablet (20 mg total) by mouth 2 (two) times daily., Disp: 60 tablet, Rfl: 6 .  traZODone (DESYREL) 50 MG tablet, Take 1 tablet by mouth at bedtime. Takes rare prn now., Disp: , Rfl:   No Known Allergies   Review of Systems  Constitutional: Negative for fever, chills, weight loss and malaise/fatigue.  HENT: Negative for hearing loss.   Eyes: Negative for blurred vision and double vision.  Respiratory: Negative for cough, shortness of breath and wheezing.   Cardiovascular: Negative for chest pain, palpitations and leg swelling.   Gastrointestinal: Negative for heartburn, abdominal pain and blood in stool.  Genitourinary: Negative for dysuria, urgency and frequency.  Musculoskeletal: Negative for myalgias and back pain.  Skin: Negative for rash.  Neurological: Negative for dizziness, tremors, weakness and headaches.      Objective  Filed Vitals:   12/04/15 0956  BP: 119/75  Pulse: 51  Temp: 98 F (36.7 C)  TempSrc: Oral  Resp: 16  Height: 5\' 11"  (1.803 m)  Weight: 171 lb (77.565 kg)    Physical Exam  Constitutional: He is oriented to person, place, and time and well-developed, well-nourished, and in no distress. No distress.  HENT:  Head: Normocephalic and atraumatic.  Eyes: Conjunctivae and EOM are normal. Pupils are equal, round, and reactive to light. No scleral icterus.  Neck: Normal range of motion. Neck supple. Carotid bruit is not present. No thyromegaly present.  Cardiovascular: Regular rhythm and normal heart sounds.  Bradycardia present.  Exam reveals no gallop and no friction rub.   No murmur heard. Pulmonary/Chest: Effort normal and breath sounds normal. No respiratory distress. He has no wheezes. He has no rales.  Musculoskeletal: He exhibits no edema.  Lymphadenopathy:    He has no cervical adenopathy.  Neurological: He is alert and oriented to person, place,  and time.  Vitals reviewed.      Recent Results (from the past 2160 hour(s))  POCT HgB A1C     Status: Abnormal   Collection Time: 10/15/15  8:58 AM  Result Value Ref Range   Hemoglobin A1C 6.5%   Lipid Profile     Status: None   Collection Time: 10/15/15  9:14 AM  Result Value Ref Range   Cholesterol, Total 121 100 - 199 mg/dL   Triglycerides 84 0 - 149 mg/dL   HDL 47 >39 mg/dL   VLDL Cholesterol Cal 17 5 - 40 mg/dL   LDL Calculated 57 0 - 99 mg/dL   Chol/HDL Ratio 2.6 0.0 - 5.0 ratio units    Comment:                                   T. Chol/HDL Ratio                                             Men  Women                                1/2 Avg.Risk  3.4    3.3                                   Avg.Risk  5.0    4.4                                2X Avg.Risk  9.6    7.1                                3X Avg.Risk 23.4   11.0   Comprehensive Metabolic Panel (CMET)     Status: None   Collection Time: 10/15/15  9:14 AM  Result Value Ref Range   Glucose 95 65 - 99 mg/dL   BUN 11 8 - 27 mg/dL   Creatinine, Ser 1.11 0.76 - 1.27 mg/dL   GFR calc non Af Amer 68 >59 mL/min/1.73   GFR calc Af Amer 79 >59 mL/min/1.73   BUN/Creatinine Ratio 10 10 - 24   Sodium 142 134 - 144 mmol/L   Potassium 4.6 3.5 - 5.2 mmol/L   Chloride 102 96 - 106 mmol/L   CO2 25 18 - 29 mmol/L   Calcium 9.2 8.6 - 10.2 mg/dL   Total Protein 6.6 6.0 - 8.5 g/dL   Albumin 4.2 3.6 - 4.8 g/dL   Globulin, Total 2.4 1.5 - 4.5 g/dL   Albumin/Globulin Ratio 1.8 1.2 - 2.2   Bilirubin Total 0.6 0.0 - 1.2 mg/dL   Alkaline Phosphatase 106 39 - 117 IU/L   AST 34 0 - 40 IU/L   ALT 42 0 - 44 IU/L     Assessment & Plan  Problem List Items Addressed This Visit      Cardiovascular and Mediastinum   Hypertension - Primary     Endocrine   Uncomplicated type 2 diabetes mellitus ([place])     Other   Difficulty sleeping  Hyperlipidemia   Bradycardia      No orders of the defined types were placed in this encounter.   1. Essential hypertension Cont meds  2. Bradycardia   3. Hyperlipidemia  cont meds 4. Type 2 diabetes mellitus without complication, without long-term current use of insulin (HCC) Check  BS every other day  5. Difficulty sleeping

## 2015-12-04 NOTE — Patient Instructions (Signed)
A1c on return

## 2016-01-15 DIAGNOSIS — G250 Essential tremor: Secondary | ICD-10-CM | POA: Diagnosis not present

## 2016-01-15 DIAGNOSIS — G479 Sleep disorder, unspecified: Secondary | ICD-10-CM | POA: Diagnosis not present

## 2016-02-04 ENCOUNTER — Encounter: Payer: Self-pay | Admitting: *Deleted

## 2016-02-05 ENCOUNTER — Ambulatory Visit: Payer: Medicare Other | Admitting: Anesthesiology

## 2016-02-05 ENCOUNTER — Ambulatory Visit
Admission: RE | Admit: 2016-02-05 | Discharge: 2016-02-05 | Disposition: A | Discharge status: Home / Self Care | Payer: Medicare Other | Source: Ambulatory Visit | Attending: Gastroenterology | Admitting: Gastroenterology

## 2016-02-05 ENCOUNTER — Encounter
Admission: RE | Disposition: A | Discharge status: Home / Self Care | Payer: Self-pay | Source: Ambulatory Visit | Attending: Gastroenterology

## 2016-02-05 ENCOUNTER — Encounter: Payer: Self-pay | Admitting: *Deleted

## 2016-02-05 DIAGNOSIS — E785 Hyperlipidemia, unspecified: Secondary | ICD-10-CM | POA: Insufficient documentation

## 2016-02-05 DIAGNOSIS — Z8371 Family history of colonic polyps: Secondary | ICD-10-CM | POA: Diagnosis not present

## 2016-02-05 DIAGNOSIS — Z1211 Encounter for screening for malignant neoplasm of colon: Secondary | ICD-10-CM | POA: Diagnosis not present

## 2016-02-05 DIAGNOSIS — E119 Type 2 diabetes mellitus without complications: Secondary | ICD-10-CM | POA: Insufficient documentation

## 2016-02-05 DIAGNOSIS — I10 Essential (primary) hypertension: Secondary | ICD-10-CM | POA: Diagnosis not present

## 2016-02-05 DIAGNOSIS — K5730 Diverticulosis of large intestine without perforation or abscess without bleeding: Secondary | ICD-10-CM | POA: Diagnosis not present

## 2016-02-05 DIAGNOSIS — Z79899 Other long term (current) drug therapy: Secondary | ICD-10-CM | POA: Diagnosis not present

## 2016-02-05 DIAGNOSIS — K635 Polyp of colon: Secondary | ICD-10-CM | POA: Insufficient documentation

## 2016-02-05 DIAGNOSIS — K5790 Diverticulosis of intestine, part unspecified, without perforation or abscess without bleeding: Secondary | ICD-10-CM | POA: Diagnosis not present

## 2016-02-05 DIAGNOSIS — D127 Benign neoplasm of rectosigmoid junction: Secondary | ICD-10-CM | POA: Diagnosis not present

## 2016-02-05 HISTORY — PX: COLONOSCOPY WITH PROPOFOL: SHX5780

## 2016-02-05 SURGERY — COLONOSCOPY WITH PROPOFOL
Anesthesia: General

## 2016-02-05 MED ORDER — FENTANYL CITRATE (PF) 100 MCG/2ML IJ SOLN
INTRAMUSCULAR | Status: DC | PRN
  Administered 2016-02-05: 50 ug via INTRAVENOUS

## 2016-02-05 MED ORDER — PROPOFOL 500 MG/50ML IV EMUL
INTRAVENOUS | Status: DC | PRN
  Administered 2016-02-05: 140 ug/kg/min via INTRAVENOUS

## 2016-02-05 MED ORDER — PROPOFOL 10 MG/ML IV BOLUS
INTRAVENOUS | Status: DC | PRN
  Administered 2016-02-05: 100 mg via INTRAVENOUS

## 2016-02-05 MED ORDER — SODIUM CHLORIDE 0.9 % IV SOLN: INTRAVENOUS | Status: DC

## 2016-02-05 MED ORDER — SODIUM CHLORIDE 0.9 % IV SOLN
INTRAVENOUS | Status: DC
  Administered 2016-02-05: 1000 mL via INTRAVENOUS

## 2016-02-05 MED ORDER — LIDOCAINE 2% (20 MG/ML) 5 ML SYRINGE
INTRAMUSCULAR | Status: DC | PRN
  Administered 2016-02-05: 40 mg via INTRAVENOUS

## 2016-02-05 MED ORDER — MIDAZOLAM HCL 5 MG/5ML IJ SOLN
INTRAMUSCULAR | Status: DC | PRN
  Administered 2016-02-05: 1 mg via INTRAVENOUS

## 2016-02-05 MED ORDER — SODIUM CHLORIDE 0.9 % IV SOLN
INTRAVENOUS | Status: DC
  Administered 2016-02-05: 14:00:00 via INTRAVENOUS

## 2016-02-05 NOTE — Op Note (Signed)
[facility] Gastroenterology Patient Name: Jesse Figueroa Procedure Date: 02/05/2016 2:04 PM MRN: [id number] Account #: [id number] Date of Birth: [birth year] Admit Type: Outpatient Age: 65 Room: [facility] ENDO ROOM 3 Gender: Male Note Status: Finalized Procedure:            Colonoscopy Indications:          Family history of colonic polyps in a first-degree                        relative Providers:            Lollie Sails, MD Referring MD:         Arlis Porta, MD (Referring MD) Medicines:            Monitored Anesthesia Care Complications:        No immediate complications. Procedure:            Pre-Anesthesia Assessment:                       - ASA Grade Assessment: II - A patient with mild                        systemic disease.                       After obtaining informed consent, the colonoscope was                        passed under direct vision. Throughout the procedure,                        the patient's blood pressure, pulse, and oxygen                        saturations were monitored continuously. The                        Colonoscope was introduced through the anus and                        advanced to the the cecum, identified by appendiceal                        orifice and ileocecal valve. The colonoscopy was                        performed without difficulty. The patient tolerated the                        procedure well. The quality of the bowel preparation                        was good. Findings:      Two sessile polyps were found in the recto-sigmoid colon. The polyps       were 1 to 2 mm in size.      A few small-mouthed diverticula were found in the sigmoid colon and       distal descending colon.      The digital rectal exam was normal.      The retroflexed view of the distal rectum and anal verge was normal  and       showed no anal or rectal abnormalities. Impression:           - Two 1 to 2 mm polyps at the  recto-sigmoid colon.                       - Diverticulosis in the sigmoid colon and in the distal                        descending colon.                       - The distal rectum and anal verge are normal on                        retroflexion view.                       - No specimens collected. Recommendation:       - Discharge patient to home.                       - Telephone GI clinic for pathology results in 1 week. Procedure Code(s):    --- Professional ---                       [phone], Colonoscopy, flexible; diagnostic, including                        collection of specimen(s) by brushing or washing, when                        performed (separate procedure) Diagnosis Code(s):    --- Professional ---                       D12.7, Benign neoplasm of rectosigmoid junction                       Z83.71, Family history of colonic polyps                       K57.30, Diverticulosis of large intestine without                        perforation or abscess without bleeding CPT copyright 2016 American Medical Association. All rights reserved. The codes documented in this report are preliminary and upon coder review may  be revised to meet current compliance requirements. Lollie Sails, MD 02/05/2016 2:34:17 PM This report has been signed electronically. Number of Addenda: 0 Note Initiated On: 02/05/2016 2:04 PM Scope Withdrawal Time: 0 hours 7 minutes 54 seconds  Total Procedure Duration: 0 hours 19 minutes 37 seconds       [facility]

## 2016-02-05 NOTE — H&P (Signed)
Outpatient short stay form Pre-procedure 02/05/2016 1:53 PM Lollie Sails MD  Primary Physician: Dr. Luan Pulling  Reason for visit:  Colonoscopy  History of present illness:  Patient is a 65 year old male presenting today for colonoscopy in regards to family history of colon polyps her primary relative, brother. He tolerated his prep well. He takes no aspirin or blood thinning agents.    Current Facility-Administered Medications:  .  0.9 %  sodium chloride infusion, , Intravenous, Continuous, Lollie Sails, MD .  0.9 %  sodium chloride infusion, , Intravenous, Continuous, Lollie Sails, MD, Last Rate: 20 mL/hr at 02/05/16 1324, 1,000 mL at 02/05/16 1324 .  0.9 %  sodium chloride infusion, , Intravenous, Continuous, Lollie Sails, MD  Prescriptions Prior to Admission  Medication Sig Dispense Refill Last Dose  . atorvastatin (LIPITOR) 10 MG tablet Take 1 tablet (10 mg total) by mouth daily at 6 PM. 30 tablet 6 02/04/2016 at Unknown time  . losartan (COZAAR) 50 MG tablet Take 1 tablet (50 mg total) by mouth daily. 30 tablet 6 02/04/2016 at Unknown time  . propranolol (INDERAL) 20 MG tablet Take 1 tablet (20 mg total) by mouth 2 (two) times daily. 60 tablet 6 02/04/2016 at Unknown time  . traZODone (DESYREL) 50 MG tablet Take 1 tablet by mouth at bedtime. Takes rare prn now.   02/04/2016 at Unknown time     No Known Allergies   Past Medical History:  Diagnosis Date  . Hyperlipidemia   . Hypertension   . Tremor     Review of systems:      Physical Exam    Heart and lungs: Regular rate and rhythm without rub or gallop, lungs are bilaterally clear.    HEENT: Normocephalic atraumatic eyes are anicteric    Other:     Pertinant exam for procedure: Soft nontender nondistended bowel sounds positive normoactive.    Planned proceedures: Colonoscopy and indicated procedures. I have discussed the risks benefits and complications of procedures to include not limited to  bleeding, infection, perforation and the risk of sedation and the patient wishes to proceed.    Lollie Sails, MD Gastroenterology 02/05/2016  1:53 PM

## 2016-02-05 NOTE — Transfer of Care (Signed)
Immediate Anesthesia Transfer of Care Note  Patient: Jesse Figueroa  Procedure(s) Performed: Procedure(s): COLONOSCOPY WITH PROPOFOL (N/A)  Patient Location: PACU and Endoscopy Unit  Anesthesia Type:General  Level of Consciousness: sedated  Airway & Oxygen Therapy: Patient Spontanous Breathing and Patient connected to nasal cannula oxygen  Post-op Assessment: Report given to RN and Post -op Vital signs reviewed and stable  Post vital signs: Reviewed and stable  Last Vitals:  Vitals:   02/05/16 1307  BP: (!) 151/97  Pulse: (!) 53  Temp: (!) 35.8 C    Last Pain: There were no vitals filed for this visit.       Complications: No apparent anesthesia complications

## 2016-02-05 NOTE — Anesthesia Preprocedure Evaluation (Signed)
Anesthesia Evaluation  Patient identified by MRN, date of birth, ID band Patient awake    Reviewed: Allergy & Precautions, NPO status , Patient's Chart, lab work & pertinent test results, reviewed documented beta blocker date and time   History of Anesthesia Complications Negative for: history of anesthetic complications  Airway Mallampati: II  TM Distance: >3 FB Neck ROM: Full    Dental  (+) Poor Dentition   Pulmonary neg pulmonary ROS, neg sleep apnea, neg COPD,    breath sounds clear to auscultation- rhonchi (-) wheezing      Cardiovascular hypertension, Pt. on medications and Pt. on home beta blockers (-) CAD, (-) Past MI and (-) Cardiac Stents  Rhythm:Regular Rate:Normal - Systolic murmurs and - Diastolic murmurs    Neuro/Psych negative neurological ROS  negative psych ROS   GI/Hepatic negative GI ROS, Neg liver ROS,   Endo/Other  diabetes (diet controlled), Well Controlled, Type 2  Renal/GU negative Renal ROS     Musculoskeletal negative musculoskeletal ROS (+)   Abdominal (+) - obese,   Peds  Hematology negative hematology ROS (+)   Anesthesia Other Findings   Reproductive/Obstetrics                             Anesthesia Physical Anesthesia Plan  ASA: II  Anesthesia Plan: General   Post-op Pain Management:    Induction: Intravenous  Airway Management Planned: Natural Airway  Additional Equipment:   Intra-op Plan:   Post-operative Plan:   Informed Consent: I have reviewed the patients History and Physical, chart, labs and discussed the procedure including the risks, benefits and alternatives for the proposed anesthesia with the patient or authorized representative who has indicated his/her understanding and acceptance.   Dental advisory given  Plan Discussed with: CRNA and Anesthesiologist  Anesthesia Plan Comments:         Anesthesia Quick Evaluation

## 2016-02-05 NOTE — Anesthesia Postprocedure Evaluation (Signed)
Anesthesia Post Note  Patient: Jesse Figueroa  Procedure(s) Performed: Procedure(s) (LRB): COLONOSCOPY WITH PROPOFOL (N/A)  Patient location during evaluation: PACU Anesthesia Type: General Level of consciousness: awake and alert and oriented Pain management: pain level controlled Vital Signs Assessment: post-procedure vital signs reviewed and stable Respiratory status: spontaneous breathing, nonlabored ventilation and respiratory function stable Cardiovascular status: blood pressure returned to baseline and stable Postop Assessment: no signs of nausea or vomiting Anesthetic complications: no    Last Vitals:  Vitals:   02/05/16 1450 02/05/16 1500  BP: 98/77 126/88  Pulse: (!) 57 (!) 56  Resp: 13 13  Temp:      Last Pain:  Vitals:   02/05/16 1440  TempSrc: Tympanic                 Jesse Figueroa

## 2016-02-06 ENCOUNTER — Encounter: Payer: Self-pay | Admitting: Gastroenterology

## 2016-02-07 LAB — SURGICAL PATHOLOGY

## 2016-02-10 ENCOUNTER — Other Ambulatory Visit: Payer: Self-pay | Admitting: Family Medicine

## 2016-02-10 DIAGNOSIS — I10 Essential (primary) hypertension: Secondary | ICD-10-CM

## 2016-02-10 DIAGNOSIS — E785 Hyperlipidemia, unspecified: Secondary | ICD-10-CM

## 2016-03-06 ENCOUNTER — Ambulatory Visit (INDEPENDENT_AMBULATORY_CARE_PROVIDER_SITE_OTHER): Payer: Medicare Other | Admitting: Family Medicine

## 2016-03-06 ENCOUNTER — Encounter: Payer: Self-pay | Admitting: Family Medicine

## 2016-03-06 VITALS — BP 115/70 | HR 56 | Temp 98.3°F | Resp 16 | Ht 71.0 in | Wt 172.0 lb

## 2016-03-06 DIAGNOSIS — I10 Essential (primary) hypertension: Secondary | ICD-10-CM

## 2016-03-06 DIAGNOSIS — E119 Type 2 diabetes mellitus without complications: Secondary | ICD-10-CM | POA: Diagnosis not present

## 2016-03-06 DIAGNOSIS — R001 Bradycardia, unspecified: Secondary | ICD-10-CM

## 2016-03-06 DIAGNOSIS — E785 Hyperlipidemia, unspecified: Secondary | ICD-10-CM

## 2016-03-06 DIAGNOSIS — R251 Tremor, unspecified: Secondary | ICD-10-CM | POA: Diagnosis not present

## 2016-03-06 DIAGNOSIS — Z23 Encounter for immunization: Secondary | ICD-10-CM | POA: Diagnosis not present

## 2016-03-06 LAB — POCT GLYCOSYLATED HEMOGLOBIN (HGB A1C): Hemoglobin A1C: 6.4

## 2016-03-06 MED ORDER — PROPRANOLOL HCL 20 MG PO TABS: ORAL_TABLET | ORAL | 6 refills | Status: DC

## 2016-03-06 NOTE — Progress Notes (Signed)
Name: Jesse Figueroa   MRN: [id number]    DOB: [birth year]   Date:03/06/2016       Progress Note  Subjective  Chief Complaint  Chief Complaint  Patient presents with  . Hypertension    HPI  Here for f/u of HBP.  Has tremor.  Propranolol helps tremor some.  Overall feeling well. No problem-specific Assessment & Plan notes found for this encounter.   Past Medical History:  Diagnosis Date  . Hyperlipidemia   . Hypertension   . Tremor     Past Surgical History:  Procedure Laterality Date  . COLONOSCOPY WITH PROPOFOL N/A 02/05/2016   Procedure: COLONOSCOPY WITH PROPOFOL;  Surgeon: Lollie Sails, MD;  Location: [facility] ENDOSCOPY;  Service: Endoscopy;  Laterality: N/A;    Family History  Problem Relation Age of Onset  . Heart disease Mother   . Hypertension Mother   . Cancer Father     Social History   Social History  . Marital status: Married    Spouse name: N/A  . Number of children: N/A  . Years of education: N/A   Occupational History  . Not on file.   Social History Main Topics  . Smoking status: Never Smoker  . Smokeless tobacco: Never Used  . Alcohol use No  . Drug use: No  . Sexual activity: Not on file   Other Topics Concern  . Not on file   Social History Narrative  . No narrative on file     Current Outpatient Prescriptions:  .  atorvastatin (LIPITOR) 10 MG tablet, TAKE 1 TABLET (10 MG TOTAL) BY MOUTH DAILY AT 6 PM., Disp: 30 tablet, Rfl: 6 .  losartan (COZAAR) 50 MG tablet, TAKE 1 TABLET (50 MG TOTAL) BY MOUTH DAILY., Disp: 30 tablet, Rfl: 6 .  propranolol (INDERAL) 20 MG tablet, Take 1/2 tablet twice a day., Disp: 60 tablet, Rfl: 6 .  traZODone (DESYREL) 50 MG tablet, Take 1 tablet by mouth at bedtime. Takes rare prn now., Disp: , Rfl:   Not on File   Review of Systems  Constitutional: Negative for chills, fever, malaise/fatigue and weight loss.  HENT: Negative for hearing loss.   Eyes: Negative for blurred vision and double vision.   Respiratory: Negative for cough, shortness of breath and wheezing.   Cardiovascular: Negative for chest pain, palpitations and leg swelling.  Gastrointestinal: Negative for abdominal pain, blood in stool and heartburn.  Genitourinary: Negative for dysuria, frequency and urgency.  Musculoskeletal: Negative for joint pain and myalgias.  Skin: Negative for rash.  Neurological: Negative for dizziness, tremors, weakness and headaches.      Objective  Vitals:   03/06/16 1012 03/06/16 1052  BP: 114/79 115/70  Pulse: (!) 54 (!) 56  Resp: 16   Temp: 98.3 F (36.8 C)   TempSrc: Oral   Weight: 172 lb (78 kg)   Height: 5\' 11"  (1.803 m)     Physical Exam  Constitutional: He is oriented to person, place, and time and well-developed, well-nourished, and in no distress. No distress.  HENT:  Head: Normocephalic and atraumatic.  Eyes: Conjunctivae and EOM are normal. Pupils are equal, round, and reactive to light. No scleral icterus.  Neck: Normal range of motion. Neck supple. Carotid bruit is not present. No thyromegaly present.  Cardiovascular: Regular rhythm and normal heart sounds.  Bradycardia present.  Exam reveals no gallop and no friction rub.   No murmur heard. Pulmonary/Chest: Effort normal and breath sounds normal. No respiratory distress. He has  no wheezes. He has no rales.  Abdominal: Soft. Bowel sounds are normal. He exhibits no distension and no mass. There is no tenderness.  Musculoskeletal: He exhibits no edema.  Lymphadenopathy:    He has no cervical adenopathy.  Neurological: He is alert and oriented to person, place, and time.  Vitals reviewed.      Recent Results (from the past 2160 hour(s))  Surgical pathology     Status: None   Collection Time: 02/05/16  2:13 PM  Result Value Ref Range   SURGICAL PATHOLOGY      Surgical Pathology CASE: [id number] PATIENT: Jesse Figueroa Surgical Pathology Report     SPECIMEN SUBMITTED: A. Colon polyp x3, recto  sigmoid; cbx  CLINICAL HISTORY: None provided  PRE-OPERATIVE DIAGNOSIS: FH colon polyps  POST-OPERATIVE DIAGNOSIS: Diverticulosis, recto sigmoid polyp x3     DIAGNOSIS: A. COLON POLYP 3, RECTOSIGMOID; COLD BIOPSY: - PROMINENT LYMPHOID AGGREGATE (1). - HYPERPLASTIC EPITHELIAL CHANGE (2). - NEGATIVE FOR DYSPLASIA AND MALIGNANCY. - MULTIPLE DEEPER LEVELS WERE EXAMINED.   GROSS DESCRIPTION:  A. Labeled: C BX rectosigmoid 3  Tissue fragment(s): 3  Size: 0.2-0.3 cm  Description: tan  Entirely submitted in 1 cassette(s).    Final Diagnosis performed by Delorse Lek, MD.  Electronically signed 02/07/2016 4:01:45PM    The electronic signature indicates that the named Attending Pathologist has evaluated the specimen  Technical component performed at [facility], [address], [place], [place] [address] Lab: [place]: Darrick Penna. Evette Doffing, MD  Professional component performed at [facility], [facility], [address], [place], [place] [address] Lab: [id number] Dir: Dellia Nims. Rubinas, MD       Assessment & Plan  Problem List Items Addressed This Visit      Cardiovascular and Mediastinum   Hypertension - Primary   Relevant Medications   propranolol (INDERAL) 20 MG tablet     Endocrine   Uncomplicated type 2 diabetes mellitus (HCC)   Relevant Orders   POCT HgB A1C     Other   Has a tremor   Relevant Medications   propranolol (INDERAL) 20 MG tablet   Immunization due   Relevant Orders   Flu vaccine HIGH DOSE PF (Fluzone High dose)   Hyperlipidemia   Relevant Medications   propranolol (INDERAL) 20 MG tablet   Bradycardia    Other Visit Diagnoses   None.     Meds ordered this encounter  Medications  . DISCONTD: propranolol ER (INDERAL LA) 60 MG 24 hr capsule    Sig: Take 60 mg by mouth daily.  . propranolol (INDERAL) 20 MG tablet    Sig: Take 1/2 tablet twice a day.    Dispense:  60 tablet    Refill:  6   1. Essential  hypertension Cont Losartan  2. Type 2 diabetes mellitus without complication, without long-term current use of insulin (HCC)  - POCT HgB A1C-6.4  3. Has a tremor  - propranolol (INDERAL) 20 MG tablet; Take 1/2 tablet twice a day.  Dispense: 60 tablet; Refill: 6  4. Bradycardia   5. Hyperlipidemia Cont Lipitor  6. Immunization due  - Flu vaccine HIGH DOSE PF (Fluzone High dose)

## 2016-04-11 DIAGNOSIS — Z9889 Other specified postprocedural states: Secondary | ICD-10-CM | POA: Diagnosis not present

## 2016-04-11 DIAGNOSIS — H25019 Cortical age-related cataract, unspecified eye: Secondary | ICD-10-CM | POA: Diagnosis not present

## 2016-04-11 DIAGNOSIS — H43393 Other vitreous opacities, bilateral: Secondary | ICD-10-CM | POA: Diagnosis not present

## 2016-04-11 DIAGNOSIS — H40013 Open angle with borderline findings, low risk, bilateral: Secondary | ICD-10-CM | POA: Diagnosis not present

## 2016-05-01 ENCOUNTER — Encounter: Payer: Self-pay | Admitting: Family Medicine

## 2016-05-01 ENCOUNTER — Ambulatory Visit (INDEPENDENT_AMBULATORY_CARE_PROVIDER_SITE_OTHER): Payer: Medicare Other | Admitting: Family Medicine

## 2016-05-01 VITALS — BP 130/80 | HR 60 | Temp 97.9°F | Resp 16 | Ht 71.0 in | Wt 175.0 lb

## 2016-05-01 DIAGNOSIS — Z23 Encounter for immunization: Secondary | ICD-10-CM

## 2016-05-01 DIAGNOSIS — I10 Essential (primary) hypertension: Secondary | ICD-10-CM | POA: Diagnosis not present

## 2016-05-01 DIAGNOSIS — E784 Other hyperlipidemia: Secondary | ICD-10-CM | POA: Diagnosis not present

## 2016-05-01 DIAGNOSIS — E7849 Other hyperlipidemia: Secondary | ICD-10-CM

## 2016-05-01 DIAGNOSIS — M19049 Primary osteoarthritis, unspecified hand: Secondary | ICD-10-CM

## 2016-05-01 DIAGNOSIS — E119 Type 2 diabetes mellitus without complications: Secondary | ICD-10-CM | POA: Diagnosis not present

## 2016-05-01 NOTE — Progress Notes (Signed)
Name: Jesse Figueroa   MRN: [id number]    DOB: Nov 29, 1950   Date:05/01/2016       Progress Note  Subjective  Chief Complaint  Chief Complaint  Patient presents with  . Hypertension    HPI Here for f/u of HBP.  Taking all meds.  Also with diet conrtolled DM.  He is trying to control with diet.  Needs recheck in 2-3 months.  No problem-specific Assessment & Plan notes found for this encounter.   Past Medical History:  Diagnosis Date  . Hyperlipidemia   . Hypertension   . Tremor     Past Surgical History:  Procedure Laterality Date  . COLONOSCOPY WITH PROPOFOL N/A 02/05/2016   Procedure: COLONOSCOPY WITH PROPOFOL;  Surgeon: Lollie Sails, MD;  Location: [facility] ENDOSCOPY;  Service: Endoscopy;  Laterality: N/A;    Family History  Problem Relation Age of Onset  . Heart disease Mother   . Hypertension Mother   . Cancer Father     Social History   Social History  . Marital status: Married    Spouse name: N/A  . Number of children: N/A  . Years of education: N/A   Occupational History  . Not on file.   Social History Main Topics  . Smoking status: Never Smoker  . Smokeless tobacco: Never Used  . Alcohol use No  . Drug use: No  . Sexual activity: Not on file   Other Topics Concern  . Not on file   Social History Narrative  . No narrative on file     Current Outpatient Prescriptions:  .  atorvastatin (LIPITOR) 10 MG tablet, TAKE 1 TABLET (10 MG TOTAL) BY MOUTH DAILY AT 6 PM., Disp: 30 tablet, Rfl: 6 .  losartan (COZAAR) 50 MG tablet, TAKE 1 TABLET (50 MG TOTAL) BY MOUTH DAILY., Disp: 30 tablet, Rfl: 6 .  propranolol (INDERAL) 20 MG tablet, Take 1/2 tablet twice a day., Disp: 60 tablet, Rfl: 6 .  traZODone (DESYREL) 50 MG tablet, Take 1 tablet by mouth at bedtime. Takes rare prn now., Disp: , Rfl:   Not on File   Review of Systems  Constitutional: Negative for chills, fever, malaise/fatigue and weight loss.  HENT: Negative for hearing loss and tinnitus.    Eyes: Negative for blurred vision and double vision.       Floaters.  Seeing Ophthal.  Respiratory: Negative for cough, shortness of breath and wheezing.   Cardiovascular: Negative for chest pain, palpitations and leg swelling.  Gastrointestinal: Negative for abdominal pain, blood in stool and heartburn.  Genitourinary: Negative for dysuria, frequency and urgency.  Musculoskeletal: Negative for joint pain and myalgias.  Skin: Negative for rash.  Neurological: Negative for dizziness, tingling, tremors, weakness and headaches.      Objective  Vitals:   05/01/16 1045 05/01/16 1119 05/01/16 1125  BP: 134/80 130/80   Pulse: (!) 55  60  Resp: 16    Temp: 97.9 F (36.6 C)    TempSrc: Oral    Weight: 175 lb (79.4 kg)    Height: 5\' 11"  (1.803 m)      Physical Exam  Constitutional: He is oriented to person, place, and time and well-developed, well-nourished, and in no distress. No distress.  HENT:  Head: Normocephalic and atraumatic.  Eyes: Conjunctivae and EOM are normal. Pupils are equal, round, and reactive to light. No scleral icterus.  Neck: Normal range of motion. Neck supple. Carotid bruit is not present. No thyromegaly present.  Cardiovascular: Normal rate,  regular rhythm and normal heart sounds.  Exam reveals no gallop and no friction rub.   No murmur heard. Pulmonary/Chest: Effort normal and breath sounds normal. No respiratory distress. He has no wheezes. He has no rales.  Musculoskeletal: He exhibits no edema.  Pain with palpation of R MC-C joint of thumb  Lymphadenopathy:    He has no cervical adenopathy.  Neurological: He is alert and oriented to person, place, and time.  Vitals reviewed.      Recent Results (from the past 2160 hour(s))  Surgical pathology     Status: None   Collection Time: 02/05/16  2:13 PM  Result Value Ref Range   SURGICAL PATHOLOGY      Surgical Pathology CASE: [id number] PATIENT: Vitaly Losada Surgical Pathology  Report     SPECIMEN SUBMITTED: A. Colon polyp x3, recto sigmoid; cbx  CLINICAL HISTORY: None provided  PRE-OPERATIVE DIAGNOSIS: FH colon polyps  POST-OPERATIVE DIAGNOSIS: Diverticulosis, recto sigmoid polyp x3     DIAGNOSIS: A. COLON POLYP 3, RECTOSIGMOID; COLD BIOPSY: - PROMINENT LYMPHOID AGGREGATE (1). - HYPERPLASTIC EPITHELIAL CHANGE (2). - NEGATIVE FOR DYSPLASIA AND MALIGNANCY. - MULTIPLE DEEPER LEVELS WERE EXAMINED.   GROSS DESCRIPTION:  A. Labeled: C BX rectosigmoid 3  Tissue fragment(s): 3  Size: 0.2-0.3 cm  Description: tan  Entirely submitted in 1 cassette(s).    Final Diagnosis performed by Delorse Lek, MD.  Electronically signed 02/07/2016 4:01:45PM    The electronic signature indicates that the named Attending Pathologist has evaluated the specimen  Technical component performed at [facility], [address] [place], [place] [address] Lab: [place]: Darrick Penna. Evette Doffing, MD  Professional component performed at [facility], [facility], [place], [place], [place] [address] Lab: [phone] Dir: Dellia Nims. Rubinas, MD    POCT HgB A1C     Status: Abnormal   Collection Time: 03/06/16 11:51 AM  Result Value Ref Range   Hemoglobin A1C 6.4 %      Assessment & Plan  Problem List Items Addressed This Visit      Cardiovascular and Mediastinum   Hypertension - Primary     Endocrine   Uncomplicated type 2 diabetes mellitus ([place])     Musculoskeletal and Integument   Arthritis of hand   Relevant Orders   Ambulatory referral to Orthopedic Surgery     Other   Immunization due   Relevant Orders   Pneumococcal polysaccharide vaccine 23-valent greater than or equal to 2yo subcutaneous/IM   Hyperlipidemia      No orders of the defined types were placed in this encounter.  1. Essential hypertension Cont Losartan  2. Other hyperlipidemia Cont Lipitor  3. Type 2 diabetes mellitus without complication, without  long-term current use of insulin (HCC) Cont diet and exercise  4. Immunization due  - Pneumococcal polysaccharide vaccine 23-valent greater than or equal to 2yo subcutaneous/IM  5. Arthritis of hand  - Ambulatory referral to Orthopedic Surgery

## 2016-05-05 DIAGNOSIS — H40013 Open angle with borderline findings, low risk, bilateral: Secondary | ICD-10-CM | POA: Diagnosis not present

## 2016-05-05 DIAGNOSIS — Z9889 Other specified postprocedural states: Secondary | ICD-10-CM | POA: Diagnosis not present

## 2016-05-17 ENCOUNTER — Other Ambulatory Visit: Payer: Self-pay | Admitting: Family Medicine

## 2016-05-17 DIAGNOSIS — R251 Tremor, unspecified: Secondary | ICD-10-CM

## 2016-05-20 DIAGNOSIS — M1811 Unilateral primary osteoarthritis of first carpometacarpal joint, right hand: Secondary | ICD-10-CM | POA: Diagnosis not present

## 2016-08-25 ENCOUNTER — Ambulatory Visit: Payer: Medicare Other | Admitting: Family Medicine

## 2016-08-28 ENCOUNTER — Ambulatory Visit (INDEPENDENT_AMBULATORY_CARE_PROVIDER_SITE_OTHER): Payer: Medicare Other | Admitting: Family Medicine

## 2016-08-28 ENCOUNTER — Encounter: Payer: Self-pay | Admitting: Family Medicine

## 2016-08-28 VITALS — BP 155/80 | HR 56 | Temp 98.4°F | Resp 16 | Ht 71.0 in | Wt 181.0 lb

## 2016-08-28 DIAGNOSIS — E7849 Other hyperlipidemia: Secondary | ICD-10-CM

## 2016-08-28 DIAGNOSIS — G479 Sleep disorder, unspecified: Secondary | ICD-10-CM

## 2016-08-28 DIAGNOSIS — E784 Other hyperlipidemia: Secondary | ICD-10-CM | POA: Diagnosis not present

## 2016-08-28 DIAGNOSIS — E119 Type 2 diabetes mellitus without complications: Secondary | ICD-10-CM | POA: Diagnosis not present

## 2016-08-28 DIAGNOSIS — R001 Bradycardia, unspecified: Secondary | ICD-10-CM | POA: Diagnosis not present

## 2016-08-28 DIAGNOSIS — I10 Essential (primary) hypertension: Secondary | ICD-10-CM | POA: Diagnosis not present

## 2016-08-28 LAB — CBC WITH DIFFERENTIAL/PLATELET
Basophils Absolute: 44 cells/uL (ref 0–200)
Basophils Relative: 1 %
Eosinophils Absolute: 308 cells/uL (ref 15–500)
Eosinophils Relative: 7 %
HCT: 43.8 % (ref 38.5–50.0)
Hemoglobin: 13.8 g/dL (ref 13.2–17.1)
Lymphocytes Relative: 46 %
Lymphs Abs: 2024 cells/uL (ref 850–3900)
MCH: 27.1 pg (ref 27.0–33.0)
MCHC: 31.5 g/dL — ABNORMAL LOW (ref 32.0–36.0)
MCV: 85.9 fL (ref 80.0–100.0)
MPV: 10.2 fL (ref 7.5–12.5)
Monocytes Absolute: 440 cells/uL (ref 200–950)
Monocytes Relative: 10 %
Neutro Abs: 1584 cells/uL (ref 1500–7800)
Neutrophils Relative %: 36 %
Platelets: 327 10*3/uL (ref 140–400)
RBC: 5.1 MIL/uL (ref 4.20–5.80)
RDW: 14.4 % (ref 11.0–15.0)
WBC: 4.4 10*3/uL (ref 3.8–10.8)

## 2016-08-28 MED ORDER — LOSARTAN POTASSIUM 100 MG PO TABS: 100.0000 mg | ORAL_TABLET | Freq: Every day | ORAL | 3 refills | Status: DC

## 2016-08-28 NOTE — Progress Notes (Signed)
Name: Jesse Figueroa   MRN: [id number]    DOB: 1950/06/30   Date:08/28/2016       Progress Note  Subjective  Chief Complaint  Chief Complaint  Patient presents with  . Hypertension  . Diabetes    HPI Here for f/u of HBP and elevated blood sugar.  He has some soreness of R wrist.  He is feeling well overall.  Still active.  No problem-specific Assessment & Plan notes found for this encounter.   Past Medical History:  Diagnosis Date  . Hyperlipidemia   . Hypertension   . Tremor     Past Surgical History:  Procedure Laterality Date  . COLONOSCOPY WITH PROPOFOL N/A 02/05/2016   Procedure: COLONOSCOPY WITH PROPOFOL;  Surgeon: Lollie Sails, MD;  Location: [facility] ENDOSCOPY;  Service: Endoscopy;  Laterality: N/A;    Family History  Problem Relation Age of Onset  . Heart disease Mother   . Hypertension Mother   . Cancer Father     Social History   Social History  . Marital status: Married    Spouse name: N/A  . Number of children: N/A  . Years of education: N/A   Occupational History  . Not on file.   Social History Main Topics  . Smoking status: Never Smoker  . Smokeless tobacco: Never Used  . Alcohol use No  . Drug use: No  . Sexual activity: Not on file   Other Topics Concern  . Not on file   Social History Narrative  . No narrative on file     Current Outpatient Prescriptions:  .  atorvastatin (LIPITOR) 10 MG tablet, TAKE 1 TABLET (10 MG TOTAL) BY MOUTH DAILY AT 6 PM., Disp: 30 tablet, Rfl: 6 .  losartan (COZAAR) 100 MG tablet, Take 1 tablet (100 mg total) by mouth daily., Disp: 90 tablet, Rfl: 3 .  propranolol (INDERAL) 20 MG tablet, TAKE 1 TABLET (20 MG TOTAL) BY MOUTH 2 (TWO) TIMES DAILY. (Patient taking differently: Take 10 mg by mouth 2 (two) times daily. ), Disp: 60 tablet, Rfl: 5 .  traZODone (DESYREL) 50 MG tablet, Take 1 tablet by mouth at bedtime. Takes rare prn now., Disp: , Rfl:   Not on File   Review of Systems  Constitutional:  Negative for chills, fever, malaise/fatigue and weight loss.  HENT: Negative for hearing loss and tinnitus.   Eyes: Negative for blurred vision and double vision.  Respiratory: Negative for cough, shortness of breath and wheezing.   Cardiovascular: Negative for chest pain, palpitations and leg swelling.  Gastrointestinal: Negative for abdominal pain, blood in stool and heartburn.  Genitourinary: Negative for dysuria, frequency and urgency.  Musculoskeletal: Negative for joint pain and myalgias.  Skin: Negative for rash.  Neurological: Negative for tingling, tremors, sensory change, weakness and headaches.  Psychiatric/Behavioral: Negative for depression. The patient is not nervous/anxious.       Objective  Vitals:   08/28/16 0817 08/28/16 0922  BP: 140/85 (!) 155/80  Pulse: (!) 55 (!) 56  Resp: 16   Temp: 98.4 F (36.9 C)   TempSrc: Oral   Weight: 181 lb (82.1 kg)   Height: 5\' 11"  (1.803 m)     Physical Exam  Constitutional: He is oriented to person, place, and time and well-developed, well-nourished, and in no distress. No distress.  HENT:  Head: Normocephalic and atraumatic.  Eyes: Conjunctivae and EOM are normal. Pupils are equal, round, and reactive to light. No scleral icterus.  Neck: Normal range of motion.  Neck supple. Carotid bruit is not present. No thyromegaly present.  Cardiovascular: Normal rate and regular rhythm.  Exam reveals no gallop and no friction rub.   No murmur heard. Pulmonary/Chest: Effort normal and breath sounds normal. No respiratory distress. He has no wheezes. He has no rales.  Abdominal: Soft. Bowel sounds are normal. He exhibits no distension and no mass. There is no tenderness.  Musculoskeletal: He exhibits no edema.  Wearing brace on R wrist  Lymphadenopathy:    He has no cervical adenopathy.  Neurological: He is alert and oriented to person, place, and time.  Vitals reviewed.      No results found for this or any previous visit (from  the past 2160 hour(s)).   Assessment & Plan  Problem List Items Addressed This Visit      Cardiovascular and Mediastinum   Hypertension - Primary   Relevant Medications   losartan (COZAAR) 100 MG tablet   Other Relevant Orders   COMPLETE METABOLIC PANEL WITH GFR   CBC with Differential     Endocrine   Uncomplicated type 2 diabetes mellitus (HCC)   Relevant Medications   losartan (COZAAR) 100 MG tablet   Other Relevant Orders   HgB A1c     Other   Difficulty sleeping   Hyperlipidemia   Relevant Medications   losartan (COZAAR) 100 MG tablet   Other Relevant Orders   Lipid Profile   Bradycardia      Meds ordered this encounter  Medications  . losartan (COZAAR) 100 MG tablet    Sig: Take 1 tablet (100 mg total) by mouth daily.    Dispense:  90 tablet    Refill:  3   1. Essential hypertension cont Propranolol - losartan (COZAAR) 100 MG tablet; Take 1 tablet (100 mg total) by mouth daily.  Dispense: 90 tablet; Refill: 3 - COMPLETE METABOLIC PANEL WITH GFR - CBC with Differential  2. Type 2 diabetes mellitus without complication, without long-term current use of insulin (HCC)  - HgB A1c  3. Difficulty sleeping   4. Other hyperlipidemia Cont Lipitor - Lipid Profile  5. Bradycardia

## 2016-08-29 LAB — COMPLETE METABOLIC PANEL WITH GFR
ALT: 32 U/L (ref 9–46)
AST: 26 U/L (ref 10–35)
Albumin: 4.2 g/dL (ref 3.6–5.1)
Alkaline Phosphatase: 109 U/L (ref 40–115)
BUN: 12 mg/dL (ref 7–25)
CO2: 27 mmol/L (ref 20–31)
Calcium: 9.6 mg/dL (ref 8.6–10.3)
Chloride: 103 mmol/L (ref 98–110)
Creat: 1.17 mg/dL (ref 0.70–1.25)
GFR, Est African American: 74 mL/min (ref >=60)
GFR, Est Non African American: 64 mL/min (ref >=60)
Glucose, Bld: 101 mg/dL — ABNORMAL HIGH (ref 65–99)
Potassium: 4.5 mmol/L (ref 3.5–5.3)
Sodium: 140 mmol/L (ref 135–146)
Total Bilirubin: 0.6 mg/dL (ref 0.2–1.2)
Total Protein: 7.1 g/dL (ref 6.1–8.1)

## 2016-08-29 LAB — LIPID PANEL
Cholesterol: 148 mg/dL (ref <200)
HDL: 45 mg/dL (ref >40)
LDL Cholesterol: 79 mg/dL (ref <100)
Total CHOL/HDL Ratio: 3.3 Ratio (ref <5.0)
Triglycerides: 121 mg/dL (ref <150)
VLDL: 24 mg/dL (ref <30)

## 2016-08-29 LAB — HEMOGLOBIN A1C
Hgb A1c MFr Bld: 6.3 % — ABNORMAL HIGH (ref <5.7)
Mean Plasma Glucose: 134 mg/dL

## 2016-09-06 ENCOUNTER — Other Ambulatory Visit: Payer: Self-pay | Admitting: Family Medicine

## 2016-09-06 DIAGNOSIS — E785 Hyperlipidemia, unspecified: Secondary | ICD-10-CM

## 2016-09-08 ENCOUNTER — Other Ambulatory Visit: Payer: Self-pay | Admitting: Family Medicine

## 2016-09-08 DIAGNOSIS — I10 Essential (primary) hypertension: Secondary | ICD-10-CM

## 2016-11-06 ENCOUNTER — Other Ambulatory Visit: Payer: Self-pay | Admitting: Family Medicine

## 2016-11-06 DIAGNOSIS — R251 Tremor, unspecified: Secondary | ICD-10-CM

## 2016-12-15 ENCOUNTER — Other Ambulatory Visit: Payer: Self-pay

## 2016-12-15 DIAGNOSIS — R251 Tremor, unspecified: Secondary | ICD-10-CM

## 2016-12-15 MED ORDER — PROPRANOLOL HCL 20 MG PO TABS: 20.0000 mg | ORAL_TABLET | Freq: Two times a day (BID) | ORAL | 5 refills | Status: DC

## 2017-06-15 ENCOUNTER — Other Ambulatory Visit: Payer: Self-pay

## 2017-06-15 DIAGNOSIS — I10 Essential (primary) hypertension: Secondary | ICD-10-CM

## 2017-06-15 MED ORDER — LOSARTAN POTASSIUM 50 MG PO TABS: 50.0000 mg | ORAL_TABLET | Freq: Every day | ORAL | 0 refills | Status: DC

## 2017-06-17 ENCOUNTER — Encounter: Payer: Self-pay | Admitting: Family Medicine

## 2017-06-17 ENCOUNTER — Other Ambulatory Visit: Payer: Self-pay | Admitting: Family Medicine

## 2017-06-17 ENCOUNTER — Ambulatory Visit (INDEPENDENT_AMBULATORY_CARE_PROVIDER_SITE_OTHER): Payer: Medicare HMO | Admitting: Family Medicine

## 2017-06-17 VITALS — BP 140/84 | HR 55 | Temp 97.9°F | Resp 16 | Ht 71.0 in | Wt 180.0 lb

## 2017-06-17 DIAGNOSIS — Z23 Encounter for immunization: Secondary | ICD-10-CM

## 2017-06-17 DIAGNOSIS — G250 Essential tremor: Secondary | ICD-10-CM

## 2017-06-17 DIAGNOSIS — E7849 Other hyperlipidemia: Secondary | ICD-10-CM

## 2017-06-17 DIAGNOSIS — R001 Bradycardia, unspecified: Secondary | ICD-10-CM | POA: Diagnosis not present

## 2017-06-17 DIAGNOSIS — I10 Essential (primary) hypertension: Secondary | ICD-10-CM

## 2017-06-17 DIAGNOSIS — R7303 Prediabetes: Secondary | ICD-10-CM

## 2017-06-17 DIAGNOSIS — Z0000 Encounter for general adult medical examination without abnormal findings: Secondary | ICD-10-CM

## 2017-06-17 DIAGNOSIS — Z125 Encounter for screening for malignant neoplasm of prostate: Secondary | ICD-10-CM

## 2017-06-17 DIAGNOSIS — Z1159 Encounter for screening for other viral diseases: Secondary | ICD-10-CM

## 2017-06-17 NOTE — Assessment & Plan Note (Signed)
Controlled on Propranolol for essential tremor and HTN  Plan - Lower dose of Propranolol today from 20 BID to HALF for 10mg  BID (cut tabs in half) no new rx at this time, if improved in future we can write new rx

## 2017-06-17 NOTE — Assessment & Plan Note (Signed)
Asymptomatic. Improved on re-check, prior 48 on electronic History of stable bradycardia 50s to 60, rare 40s On Propranolol for essential tremor and HTN  Plan - Lower dose of Propranolol today from 20 BID to HALF for 10mg  BID (cut tabs in half) no new rx at this time, if improved in future we can write new rx

## 2017-06-17 NOTE — Assessment & Plan Note (Signed)
Controlled cholesterol on statin and lifestyle Last lipid panel 08/2016  Plan: 1. Continue current meds - Atorvastatin 10mg  daily 2. Future consider ASA 81mg  for primary ASCVD risk reduction 3. Encourage improved lifestyle - low carb/cholesterol, reduce portion size, continue improving regular exercise 4. Follow-up 3 months fasting labs lipids

## 2017-06-17 NOTE — Assessment & Plan Note (Signed)
Controlled Pre-DM on prior readings improved A1c 6.3, down from 6.5 to 6.6, never consistently had A1c >6.5, borderline dx Pre-DM vs DM Concern with HTN. Fam history of DM  Plan:  1. Not on any therapy currently  2. Encourage improved lifestyle - glycemic index handout given, low carb, low sugar diet, reduce portion size, continue improving regular exercise 3. Check A1c serum today 4. Follow-up 3 months Annual physical + labs including A1c

## 2017-06-17 NOTE — Assessment & Plan Note (Signed)
Mildly elevated initial BP, repeat manual check improved but still elevated SBP 140. - Home BP readings limited, but similar, chart review is 50/50 some elevated mild and often otherwise controlled  Elevated Creatinine in past, likely CKD   Plan:  1. Discussion on HTN management - past has adjusted meds - INCREASE Losartan from 50mg  daily to 100mg  daily - take x 2 pills - no new rx today, patient to monitor BP and call us in 3-4 weeks to request new rx Losartan 100mg  daily if doing well on this - REDUCE Propranolol 20mg  BID to 10mg  BID - due to bradycardia, also for essential tremor 2. Encourage improved lifestyle - low sodium diet, regular exercise 3. Start monitor BP outside office, bring readings to next visit, if persistently >140/90 or new symptoms notify office sooner - home cuff and outside office pharmacy, given log 4. Follow-up by phone 4 weeks, and office 3 months annual + labs

## 2017-06-17 NOTE — Patient Instructions (Addendum)
Thank you for coming to the office today.  1. MED CHANGES TODAY - INCREASE Losartan 50mg  for BP - take TWO pills at once daily in morning = dose of 100mg , you should have about 1.5 months of this pill, after about 3-4 weeks if BP is improved goal < 140/90 on average, then we can send new rx for Losartan 100mg  pills once daily - just call our office to request this  - REDUCE Propranolol from 20mg  pill twice daily for tremor and BP - NOW reduce to HALF pill (10mg  dose) twice daily, if tremor is still controlled, and pulse is better we will continue at this lower dose  2. Pre-Diabetes, last A1c 6.3, goal to be < 6.5 to avoid diabetes. Review diet handout given, try to keep up exercise  Will check blood test today, will send results to MyChart  3.  For Elbow, re-evaluate to make sure not putting pressure on elbow during driving or other activities  Recommend to start taking Tylenol Extra Strength 500mg  tabs - take 1 to 2 tabs per dose (max 1000mg ) every 6-8 hours for pain (take regularly, don't skip a dose for next 7 days), max 24 hour daily dose is 6 tablets or 3000mg . In the future you can repeat the same everyday Tylenol course for 1-2 weeks at a time.   DUE for FASTING BLOOD WORK (no food or drink after midnight before the lab appointment, only water or coffee without cream/sugar on the morning of)  SCHEDULE "Lab Only" visit in the morning at the clinic for lab draw in 3 MONTHS   - Make sure Lab Only appointment is at about 1 week before your next appointment, so that results will be available  For Lab Results, once available within 2-3 days of blood draw, you can can log in to MyChart online to view your results and a brief explanation. Also, we can discuss results at next follow-up visit.  Please schedule a Follow-up Appointment to: Return in about 3 months (around 09/15/2017) for Annual Physical.  If you have any other questions or concerns, please feel free to call the office or send a  message through [place]. You may also schedule an earlier appointment if necessary.  Additionally, you may be receiving a survey about your experience at our office within a few days to 1 week by e-mail or mail. We value your feedback.  [name], DO [place]

## 2017-06-17 NOTE — Progress Notes (Signed)
Subjective:    [name], male    DOB: [birth year], 67 y.o.   MRN: [id number]  Jesse Figueroa is a 67 y.o. male presenting on 06/17/2017 for Hypertension and Pre-Diabetes  Previously followed by Dr Luan Pulling, last visit 08/2016, here to establish with new PCP.  HPI   Pre-Diabetes Reports no concerns. History of elevated A1c 6.5-6.6 in past, then since re-checks have all been < 6.5, he was told "Pre-Diabetes" by prior PCP, last 6.3 CBGs: Not check CBG at home Meds: None (never on) Currently on ARB Lifestyle: - Diet (He eats smaller portions, avoids suagrs and sweets, reduces starches)  - Exercise (Every morning stationary bike at home 30 minutes, coaches Track at [employer]) - Family history of DM, Mother Denies hypoglycemia, polyuria, visual changes, numbness or tingling.  CHRONIC HTN: Reports home BP cuff variable, unsure if readings accurate. He has been on meds for years, then his dosage was reduced. Current Meds - Losartan 57m daily, Propranolol 228mBID Reports good compliance, took meds today. Tolerating well, w/o complaints. Denies CP, dyspnea, HA, edema, dizziness / lightheadedness  HYPERLIPIDEMIA: - Reports no concerns. Last lipid panel 08/2016, controlled  - Currently taking Atorvastatin 1069mtolerating well without side effects or myalgias  Bradycardia / Essential Tremor - Chronic problem, has been controlled on BB. Occasionally has episodes of tremors with eating, otherwise normal activity with arms and hands and writing no problems - he has chronic bradycardia as well, chart review shows avg pulse 50s to 60, he wears a wrist watch pulse monitor as well that shows avg 55 HR. He is very active but thinks he has a low pulse as well.  Additional complaints / updates: - Insomnia: reports chronic complaint, in past he tried Trazodone 61m33m night PRN, states it was ineffective, made him feel sluggish and lethargic after. He can function well, but  concerned about poor sleep history  Left Elbow Soreness - describes persistent problem lingering over 3-4 weeks, no known injury or trauma, no swelling or redness, no prior injury or problem, no repetitive problems with elbow/arm. Not taking any medicines.  Health Maintenance: Due for Flu Shot, will receive today  Declines TDap Agrees for routine Hepatitis C screening with next lab draw  Depression screen PHQ [facility] 06/17/2017 08/28/2016 10/15/2015  Decreased Interest 0 0 0  Down, Depressed, Hopeless 0 0 0  PHQ - 2 Score 0 0 0    Past Medical History:  Diagnosis Date  . Hyperlipidemia   . Hypertension   . Tremor    Past Surgical History:  Procedure Laterality Date  . COLONOSCOPY WITH PROPOFOL N/A 02/05/2016   Procedure: COLONOSCOPY WITH PROPOFOL;  Surgeon: MartLollie Sails;  [facility];  Service: Endoscopy;  Laterality: N/A;   Social History   Socioeconomic History  . Marital status: Married    Spouse name: Not on file  . Number of children: Not on file  . Years of education: Not on file  . Highest education level: Not on file  Social Needs  . Financial resource strain: Not on file  . Food insecurity - worry: Not on file  . Food insecurity - inability: Not on file  . Transportation needs - medical: Not on file  . Transportation needs - non-medical: Not on file  Occupational History  . 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 Smoking status: Never Smoker  . Smokeless tobacco: Never  Used  Substance and Sexual Activity  . Alcohol use: No  . Drug use: No  . Sexual activity: Not on file  Other Topics Concern  . Not on file  Social History Narrative  . Not on file   Family History  Problem Relation Age of Onset  . Heart disease Mother   . Hypertension Mother   . Diabetes Mother   . Cancer Father    Current Outpatient Medications on File Prior to Visit  Medication Sig  . atorvastatin (LIPITOR) 10 MG  tablet TAKE 1 TABLET (10 MG TOTAL) BY MOUTH DAILY AT 6 PM.  . losartan (COZAAR) 50 MG tablet Take 1 tablet (50 mg total) by mouth daily.  . propranolol (INDERAL) 20 MG tablet Take 1 tablet (20 mg total) by mouth 2 (two) times daily.   No current facility-administered medications on file prior to visit.     Review of Systems Per HPI unless specifically indicated above     Objective:    BP 140/84 (BP Location: Left Arm, Cuff Size: Normal)   Pulse (!) 55   Temp 97.9 F (36.6 C) (Oral)   Resp 16   Ht _0  (1.803 m)   Wt 180 lb (81.6 kg)   BMI 25.10 kg/m   Wt Readings from Last 3 Encounters:  06/17/17 180 lb (81.6 kg)  08/28/16 181 lb (82.1 kg)  05/01/16 175 lb (79.4 kg)    Physical Exam  Constitutional: He is oriented to person, place, and time. He appears well-developed and well-nourished. No distress.  Well-appearing, comfortable, cooperative, athletic build  HENT:  Head: Normocephalic and atraumatic.  Mouth/Throat: Oropharynx is clear and moist.  Eyes: Conjunctivae are normal. Right eye exhibits no discharge. Left eye exhibits no discharge.  Neck: Normal range of motion. Neck supple. No thyromegaly present.  Cardiovascular: Normal rate, regular rhythm, normal heart sounds and intact distal pulses.  No murmur heard. Pulmonary/Chest: Effort normal and breath sounds normal. No respiratory distress. He has no wheezes. He has no rales.  Musculoskeletal: Normal range of motion. He exhibits no edema.  Lymphadenopathy:    He has no cervical adenopathy.  Neurological: He is alert and oriented to person, place, and time.  Skin: Skin is warm and dry. No rash noted. He is not diaphoretic. No erythema.  Psychiatric: He has a normal mood and affect. His behavior is normal.  Well groomed, good eye contact, normal speech and thoughts  Nursing note and vitals reviewed.  Results for orders placed or performed in visit on 08/28/16  COMPLETE METABOLIC PANEL WITH GFR  Result Value Ref  Range   Sodium 140 135 - 146 mmol/L   Potassium 4.5 3.5 - 5.3 mmol/L   Chloride 103 98 - 110 mmol/L   CO2 27 20 - 31 mmol/L   Glucose, Bld 101 (H) 65 - 99 mg/dL   BUN 12 7 - 25 mg/dL   Creat 1.17 0.70 - 1.25 mg/dL   Total Bilirubin 0.6 0.2 - 1.2 mg/dL   Alkaline Phosphatase 109 40 - 115 U/L   AST 26 10 - 35 U/L   ALT 32 9 - 46 U/L   Total Protein 7.1 6.1 - 8.1 g/dL   Albumin 4.2 3.6 - 5.1 g/dL   Calcium 9.6 8.6 - 10.3 mg/dL   GFR, Est African American 74 >=60 mL/min   GFR, Est Non African American 64 >=60 mL/min  CBC with Differential  Result Value Ref Range   WBC 4.4 3.8 - 10.8 K/uL  RBC 5.10 4.20 - 5.80 MIL/uL   Hemoglobin 13.8 13.2 - 17.1 g/dL   HCT 43.8 38.5 - 50.0 %   MCV 85.9 80.0 - 100.0 fL   MCH 27.1 27.0 - 33.0 pg   MCHC 31.5 (L) 32.0 - 36.0 g/dL   RDW 14.4 11.0 - 15.0 %   Platelets 327 140 - 400 K/uL   MPV 10.2 7.5 - 12.5 fL   Neutro Abs 1,584 1,500 - 7,800 cells/uL   Lymphs Abs 2,024 850 - 3,900 cells/uL   Monocytes Absolute 440 200 - 950 cells/uL   Eosinophils Absolute 308 15 - 500 cells/uL   Basophils Absolute 44 0 - 200 cells/uL   Neutrophils Relative % 36 %   Lymphocytes Relative 46 %   Monocytes Relative 10 %   Eosinophils Relative 7 %   Basophils Relative 1 %   Smear Review Criteria for review not met   Lipid Profile  Result Value Ref Range   Cholesterol 148 <200 mg/dL   Triglycerides 121 <150 mg/dL   HDL 45 >40 mg/dL   Total CHOL/HDL Ratio 3.3 <5.0 Ratio   VLDL 24 <30 mg/dL   LDL Cholesterol 79 <100 mg/dL  HgB A1c  Result Value Ref Range   Hgb A1c MFr Bld 6.3 (H) <5.7 %   Mean Plasma Glucose 134 mg/dL       Assessment & Plan:   Problem List Items Addressed This Visit    Bradycardia    Asymptomatic. Improved on re-check, prior 48 on electronic History of stable bradycardia 50s to 60, rare 40s On Propranolol for essential tremor and HTN  Plan - Lower dose of Propranolol today from 20 BID to HALF for 15m BID (cut tabs in half) no new rx  at this time, if improved in future we can write new rx      Essential tremor    Controlled on Propranolol for essential tremor and HTN  Plan - Lower dose of Propranolol today from 20 BID to HALF for 115mBID (cut tabs in half) no new rx at this time, if improved in future we can write new rx      Hyperlipidemia    Controlled cholesterol on statin and lifestyle Last lipid panel 08/2016  Plan: 1. Continue current meds - Atorvastatin 1064maily 2. Future consider ASA 66m25mr primary ASCVD risk reduction 3. Encourage improved lifestyle - low carb/cholesterol, reduce portion size, continue improving regular exercise 4. Follow-up 3 months fasting labs lipids      Hypertension    Mildly elevated initial BP, repeat manual check improved but still elevated SBP 140. - Home BP readings limited, but similar, chart review is 50/50 some elevated mild and often otherwise controlled  Elevated Creatinine in past, likely CKD   Plan:  1. Discussion on HTN management - past has adjusted meds - INCREASE Losartan from 50mg65mly to 100mg 1my - take x 2 pills - no new rx today, patient to monitor BP and call us in [place] weeks to request new rx Losartan 100mg d66m if doing well on this - REDUCE Propranolol 20mg BI20m 10mg BID40mue to bradycardia, also for essential tremor 2. Encourage improved lifestyle - low sodium diet, regular exercise 3. Start monitor BP outside office, bring readings to next visit, if persistently >140/90 or new symptoms notify office sooner - home cuff and outside office pharmacy, given log 4. Follow-up by phone 4 weeks, and office 3 months annual + labs  Pre-diabetes - Primary    Controlled Pre-DM on prior readings improved A1c 6.3, down from 6.5 to 6.6, never consistently had A1c >6.5, borderline dx Pre-DM vs DM Concern with HTN. Fam history of DM  Plan:  1. Not on any therapy currently  2. Encourage improved lifestyle - glycemic index handout given, low carb, low  sugar diet, reduce portion size, continue improving regular exercise 3. Check A1c serum today 4. Follow-up 3 months Annual physical + labs including A1c      Relevant Orders   Hemoglobin A1c    Other Visit Diagnoses    Needs flu shot       Relevant Orders   Flu vaccine HIGH DOSE PF (Completed)      No orders of the defined types were placed in this encounter.   Follow up plan: Return in about 3 months (around 09/15/2017) for Annual Physical.   Future labs ordered for 3 months 09/2017.  [name], DO [facility] 06/17/2017, 10:09 AM

## 2017-06-18 LAB — HEMOGLOBIN A1C
Hgb A1c MFr Bld: 6.5 % of total Hgb — ABNORMAL HIGH (ref <5.7)
Mean Plasma Glucose: 140 (calc)
eAG (mmol/L): 7.7 (calc)

## 2017-06-19 ENCOUNTER — Other Ambulatory Visit: Payer: Self-pay | Admitting: Family Medicine

## 2017-06-19 DIAGNOSIS — R251 Tremor, unspecified: Secondary | ICD-10-CM

## 2017-06-19 DIAGNOSIS — E785 Hyperlipidemia, unspecified: Secondary | ICD-10-CM

## 2017-06-19 MED ORDER — PROPRANOLOL HCL 20 MG PO TABS: 20.0000 mg | ORAL_TABLET | Freq: Two times a day (BID) | ORAL | 3 refills | Status: DC

## 2017-06-19 MED ORDER — ATORVASTATIN CALCIUM 10 MG PO TABS: 10.0000 mg | ORAL_TABLET | Freq: Every day | ORAL | 3 refills | Status: DC

## 2017-06-19 NOTE — Telephone Encounter (Signed)
Pt. Called requesting refill on Lipitor,Propranolol

## 2017-08-03 ENCOUNTER — Other Ambulatory Visit: Payer: Self-pay | Admitting: Family Medicine

## 2017-08-03 DIAGNOSIS — I10 Essential (primary) hypertension: Secondary | ICD-10-CM

## 2017-08-03 MED ORDER — LOSARTAN POTASSIUM 100 MG PO TABS: 100.0000 mg | ORAL_TABLET | Freq: Every day | ORAL | 1 refills | Status: DC

## 2017-08-03 NOTE — Telephone Encounter (Signed)
In pt previous note see stated you possible will increase patient dosage to 100 MG once daily

## 2017-08-03 NOTE — Telephone Encounter (Signed)
Dose was changed in January 2019 from 50 to 100mg . Have not heard back, will submit new rx 100mg  daily  [name], DO [facility] 08/03/2017, 1:27 PM

## 2017-09-14 ENCOUNTER — Other Ambulatory Visit: Payer: Medicare HMO

## 2017-09-15 ENCOUNTER — Ambulatory Visit (INDEPENDENT_AMBULATORY_CARE_PROVIDER_SITE_OTHER): Payer: Medicare HMO

## 2017-09-15 ENCOUNTER — Other Ambulatory Visit: Payer: Medicare HMO

## 2017-09-15 VITALS — BP 142/80 | HR 68 | Temp 98.8°F | Resp 16 | Ht 71.0 in | Wt 176.0 lb

## 2017-09-15 DIAGNOSIS — Z1159 Encounter for screening for other viral diseases: Secondary | ICD-10-CM | POA: Diagnosis not present

## 2017-09-15 DIAGNOSIS — I10 Essential (primary) hypertension: Secondary | ICD-10-CM

## 2017-09-15 DIAGNOSIS — Z0000 Encounter for general adult medical examination without abnormal findings: Secondary | ICD-10-CM

## 2017-09-15 DIAGNOSIS — G250 Essential tremor: Secondary | ICD-10-CM

## 2017-09-15 DIAGNOSIS — E7849 Other hyperlipidemia: Secondary | ICD-10-CM

## 2017-09-15 DIAGNOSIS — R7303 Prediabetes: Secondary | ICD-10-CM | POA: Diagnosis not present

## 2017-09-15 DIAGNOSIS — Z125 Encounter for screening for malignant neoplasm of prostate: Secondary | ICD-10-CM

## 2017-09-15 NOTE — Progress Notes (Signed)
Subjective:   Jesse Figueroa is a 67 y.o. male who presents for an Initial Medicare Annual Wellness Visit.  Review of Systems   Cardiac Risk Factors include: male gender;hypertension;diabetes mellitus;advanced age (>37men, >29 women);dyslipidemia    Objective:    Today's Vitals   09/15/17 1007  BP: (!) 142/80  Pulse: 68  Resp: 16  Temp: 98.8 F (37.1 C)  TempSrc: Oral  Weight: 176 lb (79.8 kg)  Height: 5\' 11"  (1.803 m)   Body mass index is 24.55 kg/m.  Advanced Directives 09/15/2017 04/09/2015  Does Patient Have a Medical Advance Directive? Yes Yes  Type of Advance Directive Living will Living will  Copy of [place] in Chart? - No - copy requested    Current Medications (verified) Outpatient Encounter Medications as of 09/15/2017  Medication Sig  . atorvastatin (LIPITOR) 10 MG tablet Take 1 tablet (10 mg total) by mouth daily at 6 PM.  . losartan (COZAAR) 100 MG tablet Take 1 tablet (100 mg total) by mouth daily.  . propranolol (INDERAL) 20 MG tablet Take 1 tablet (20 mg total) by mouth 2 (two) times daily.   No facility-administered encounter medications on file as of 09/15/2017.     Allergies (verified) Patient has no allergy information on record.   History: Past Medical History:  Diagnosis Date  . Hyperlipidemia   . Hypertension   . Tremor    Past Surgical History:  Procedure Laterality Date  . COLONOSCOPY WITH PROPOFOL N/A 02/05/2016   Procedure: COLONOSCOPY WITH PROPOFOL;  Surgeon: Jesse Sails, MD;  Location: [facility];  Service: Endoscopy;  Laterality: N/A;   Family History  Problem Relation Age of Onset  . Heart disease Mother   . Hypertension Mother   . Diabetes Mother   . Cancer Father    Social History   Socioeconomic History  . Marital status: Married    Spouse name: Not on file  . Number of children: Not on file  . Years of education: Not on file  . Highest education level: Not on file  Occupational History   . Occupation: Psychologist, clinical    Comment: Pension scheme manager  Social Needs  . Financial resource strain: Not hard at all  . Food insecurity:    Worry: Never true    Inability: Never true  . Transportation needs:    Medical: No    Non-medical: No  Tobacco Use  . Smoking status: Never Smoker  . Smokeless tobacco: Never Used  Substance and Sexual Activity  . Alcohol use: No  . Drug use: No  . Sexual activity: Not on file  Lifestyle  . Physical activity:    Days per week: 7 days    Minutes per session: 30 min  . Stress: Not at all  Relationships  . Social connections:    Talks on phone: More than three times a week    Gets together: More than three times a week    Attends religious service: Never    Active member of club or organization: Yes    Attends meetings of clubs or organizations: More than 4 times per year    Relationship status: Married  Other Topics Concern  . Not on file  Social History Narrative   Track coach 5 days a week    Tobacco Counseling Counseling given: Not Answered   Clinical Intake:  Pre-visit preparation completed: Yes  Pain : No/denies pain     Nutritional Status: BMI of  19-24  Normal Nutritional Risks: None Diabetes: Yes CBG done?: No Did pt. bring in CBG monitor from home?: No  How often do you need to have someone help you when you read instructions, pamphlets, or other written materials from your doctor or pharmacy?: 1 - Never What is the last grade level you completed in school?: BA  Interpreter Needed?: No  Information entered by :: Jesse Hill,LPN   Activities of Daily Living In your present state of health, do you have any difficulty performing the following activities: 09/15/2017 06/17/2017  Hearing? N N  Vision? N N  Difficulty concentrating or making decisions? Y N  Walking or climbing stairs? N N  Dressing or bathing? N N  Doing errands, shopping? N N  Preparing Food and eating ? N -  Using the Toilet? N  -  In the past six months, have you accidently leaked urine? N -  Do you have problems with loss of bowel control? N -  Managing your Medications? N -  Managing your Finances? N -  Housekeeping or managing your Housekeeping? N -  Some recent data might be hidden     Immunizations and Health Maintenance Immunization History  Administered Date(s) Administered  . Influenza, High Dose Seasonal PF 03/06/2016, 06/17/2017  . Influenza-Unspecified 03/16/2013, 04/07/2015  . Pneumococcal Conjugate-13 04/09/2015  . Pneumococcal Polysaccharide-23 05/01/2016  . Tdap 06/17/2007  . Zoster 06/16/2013   There are no preventive care reminders to display for this patient.  Patient Care Team: Jesse Hauser, DO as PCP - General (Family Medicine)  Indicate any recent Medical Services you may have received from other than [facility] providers in the past year (date may be approximate).    Assessment:   This is a routine wellness examination for Jesse Figueroa.  Hearing/Vision screen Vision Screening Comments: Goes to [facility] in [place] annually   Dietary issues and exercise activities discussed: Current Exercise Habits: Home exercise routine, Type of exercise: walking(stationary bike ), Time (Minutes): 30, Frequency (Times/Week): 7, Weekly Exercise (Minutes/Week): 210, Intensity: Moderate, Exercise limited by: None identified  Goals    . DIET - INCREASE WATER INTAKE     Recommend drinking at least 6-8 glasses of water a day       Depression Screen PHQ 2/9 Scores 09/15/2017 06/17/2017 08/28/2016 10/15/2015  PHQ - 2 Score 0 0 0 0    Fall Risk Fall Risk  09/15/2017 06/17/2017 08/28/2016 10/15/2015 04/09/2015  Falls in the past year? No No No No No    Is the patient's home free of loose throw rugs in walkways, pet beds, electrical cords, etc?   yes      Grab bars in the bathroom? no      Handrails on the stairs?   yes      Adequate lighting?   no  Timed Get Up and Go performed: Completed in 8 seconds  with no use of assistive devices, steady gait. No intervention needed at this time.   Cognitive Function:     6CIT Screen 09/15/2017  What Year? 0 points  What month? 0 points  What time? 0 points  Count back from 20 0 points  Months in reverse 0 points  Repeat phrase 2 points  Total Score 2    Screening Tests Health Maintenance  Topic Date Due  . TETANUS/TDAP  06/17/2018 (Originally 06/16/2017)  . INFLUENZA VACCINE  01/14/2018  . COLONOSCOPY  02/04/2021  . Hepatitis C Screening  Completed  . PNA vac Low Risk Adult  Completed    Qualifies for Shingles Vaccine? Yes, discussed shingrix vaccine   Cancer Screenings: Lung: Low Dose CT Chest recommended if Age 41-80 years, 30 pack-year currently smoking OR have quit w/in 15years. Patient does not qualify. Colorectal: completed 02/05/2016  Additional Screenings:  Hepatitis B/HIV/Syphillis: not indicated  Hepatitis C Screening: completed today       Plan:    I have personally reviewed and addressed the Medicare Annual Wellness questionnaire and have noted the following in the patient's chart:  A. Medical and social history B. Use of alcohol, tobacco or illicit drugs  C. Current medications and supplements D. Functional ability and status E.  Nutritional status F.  Physical activity G. Advance directives H. List of other physicians I.  Hospitalizations, surgeries, and ER visits in previous 12 months J.  [place] such as hearing and vision if needed, cognitive and depression L. Referrals and appointments   In addition, I have reviewed and discussed with patient certain preventive protocols, quality metrics, and best practice recommendations. A written personalized care plan for preventive services as well as general preventive health recommendations were provided to patient.   Signed,  Tyler Aas, LPN Nurse Health Advisor   Nurse Notes:none

## 2017-09-15 NOTE — Patient Instructions (Signed)
[name] , Thank you for taking time to come for your Medicare Wellness Visit. I appreciate your ongoing commitment to your health goals. Please review the following plan we discussed and let me know if I can assist you in the future.   Screening recommendations/referrals: Colonoscopy: completed 02/05/2016  Recommended yearly ophthalmology/optometry visit for glaucoma screening and checkup Recommended yearly dental visit for hygiene and checkup  Vaccinations: Influenza vaccine: due 02/2018 Pneumococcal vaccine: up to date Tdap vaccine: up to date Shingles vaccine: shingrix eligible, check with your insurance company for coverage   Advanced directives: Please bring a copy of your health care power of attorney and living will to the office at your convenience.  Conditions/risks identified: Recommend drinking at least 6-8 glasses of water a day   Next appointment: Follow up in one year for your annual wellness exam.  Preventive Care 65 Years and Older, Male Preventive care refers to lifestyle choices and visits with your health care provider that can promote health and wellness. What does preventive care include?  A yearly physical exam. This is also called an annual well check.  Dental exams once or twice a year.  Routine eye exams. Ask your health care provider how often you should have your eyes checked.  Personal lifestyle choices, including:  Daily care of your teeth and gums.  Regular physical activity.  Eating a healthy diet.  Avoiding tobacco and drug use.  Limiting alcohol use.  Practicing safe sex.  Taking low doses of aspirin every day.  Taking vitamin and mineral supplements as recommended by your health care provider. What happens during an annual well check? The services and screenings done by your health care provider during your annual well check will depend on your age, overall health, lifestyle risk factors, and family history of disease. Counseling    Your health care provider may ask you questions about your:  Alcohol use.  Tobacco use.  Drug use.  Emotional well-being.  Home and relationship well-being.  Sexual activity.  Eating habits.  History of falls.  Memory and ability to understand (cognition).  Work and work Statistician. Screening  You may have the following tests or measurements:  Height, weight, and BMI.  Blood pressure.  Lipid and cholesterol levels. These may be checked every 5 years, or more frequently if you are over 85 years old.  Skin check.  Lung cancer screening. You may have this screening every year starting at age 71 if you have a 30-pack-year history of smoking and currently smoke or have quit within the past 15 years.  Fecal occult blood test (FOBT) of the stool. You may have this test every year starting at age 28.  Flexible sigmoidoscopy or colonoscopy. You may have a sigmoidoscopy every 5 years or a colonoscopy every 10 years starting at age 25.  Prostate cancer screening. Recommendations will vary depending on your family history and other risks.  Hepatitis C blood test.  Hepatitis B blood test.  Sexually transmitted disease (STD) testing.  Diabetes screening. This is done by checking your blood sugar (glucose) after you have not eaten for a while (fasting). You may have this done every 1-3 years.  Abdominal aortic aneurysm (AAA) screening. You may need this if you are a current or former smoker.  Osteoporosis. You may be screened starting at age 37 if you are at high risk. Talk with your health care provider about your test results, treatment options, and if necessary, the need for more tests. Vaccines  Your  health care provider may recommend certain vaccines, such as:  Influenza vaccine. This is recommended every year.  Tetanus, diphtheria, and acellular pertussis (Tdap, Td) vaccine. You may need a Td booster every 10 years.  Zoster vaccine. You may need this after age  9.  Pneumococcal 13-valent conjugate (PCV13) vaccine. One dose is recommended after age 54.  Pneumococcal polysaccharide (PPSV23) vaccine. One dose is recommended after age 68. Talk to your health care provider about which screenings and vaccines you need and how often you need them. This information is not intended to replace advice given to you by your health care provider. Make sure you discuss any questions you have with your health care provider. Document Released: 06/29/2015 Document Revised: 02/20/2016 Document Reviewed: 04/03/2015 Elsevier Interactive Patient Education  2017 [place] Prevention in the Home Falls can cause injuries. They can happen to people of all ages. There are many things you can do to make your home safe and to help prevent falls. What can I do on the outside of my home?  Regularly fix the edges of walkways and driveways and fix any cracks.  Remove anything that might make you trip as you walk through a door, such as a raised step or threshold.  Trim any bushes or trees on the path to your home.  Use bright outdoor lighting.  Clear any walking paths of anything that might make someone trip, such as rocks or tools.  Regularly check to see if handrails are loose or broken. Make sure that both sides of any steps have handrails.  Any raised decks and porches should have guardrails on the edges.  Have any leaves, snow, or ice cleared regularly.  Use sand or salt on walking paths during winter.  Clean up any spills in your garage right away. This includes oil or grease spills. What can I do in the bathroom?  Use night lights.  Install grab bars by the toilet and in the tub and shower. Do not use towel bars as grab bars.  Use non-skid mats or decals in the tub or shower.  If you need to sit down in the shower, use a plastic, non-slip stool.  Keep the floor dry. Clean up any water that spills on the floor as soon as it happens.  Remove  soap buildup in the tub or shower regularly.  Attach bath mats securely with double-sided non-slip rug tape.  Do not have throw rugs and other things on the floor that can make you trip. What can I do in the bedroom?  Use night lights.  Make sure that you have a light by your bed that is easy to reach.  Do not use any sheets or blankets that are too big for your bed. They should not hang down onto the floor.  Have a firm chair that has side arms. You can use this for support while you get dressed.  Do not have throw rugs and other things on the floor that can make you trip. What can I do in the kitchen?  Clean up any spills right away.  Avoid walking on wet floors.  Keep items that you use a lot in easy-to-reach places.  If you need to reach something above you, use a strong step stool that has a grab bar.  Keep electrical cords out of the way.  Do not use floor polish or wax that makes floors slippery. If you must use wax, use non-skid floor wax.  Do not  have throw rugs and other things on the floor that can make you trip. What can I do with my stairs?  Do not leave any items on the stairs.  Make sure that there are handrails on both sides of the stairs and use them. Fix handrails that are broken or loose. Make sure that handrails are as long as the stairways.  Check any carpeting to make sure that it is firmly attached to the stairs. Fix any carpet that is loose or worn.  Avoid having throw rugs at the top or bottom of the stairs. If you do have throw rugs, attach them to the floor with carpet tape.  Make sure that you have a light switch at the top of the stairs and the bottom of the stairs. If you do not have them, ask someone to add them for you. What else can I do to help prevent falls?  Wear shoes that:  Do not have high heels.  Have rubber bottoms.  Are comfortable and fit you well.  Are closed at the toe. Do not wear sandals.  If you use a  stepladder:  Make sure that it is fully opened. Do not climb a closed stepladder.  Make sure that both sides of the stepladder are locked into place.  Ask someone to hold it for you, if possible.  Clearly mark and make sure that you can see:  Any grab bars or handrails.  First and last steps.  Where the edge of each step is.  Use tools that help you move around (mobility aids) if they are needed. These include:  Canes.  Walkers.  Scooters.  Crutches.  Turn on the lights when you go into a dark area. Replace any light bulbs as soon as they burn out.  Set up your furniture so you have a clear path. Avoid moving your furniture around.  If any of your floors are uneven, fix them.  If there are any pets around you, be aware of where they are.  Review your medicines with your doctor. Some medicines can make you feel dizzy. This can increase your chance of falling. Ask your doctor what other things that you can do to help prevent falls. This information is not intended to replace advice given to you by your health care provider. Make sure you discuss any questions you have with your health care provider. Document Released: 03/29/2009 Document Revised: 11/08/2015 Document Reviewed: 07/07/2014 Elsevier Interactive Patient Education  2017 [employer].

## 2017-09-16 LAB — LIPID PANEL
Cholesterol: 138 mg/dL (ref <200)
HDL: 45 mg/dL (ref >40)
LDL Cholesterol (Calc): 78 mg/dL (calc)
Non-HDL Cholesterol (Calc): 93 mg/dL (calc) (ref <130)
Total CHOL/HDL Ratio: 3.1 (calc) (ref <5.0)
Triglycerides: 73 mg/dL (ref <150)

## 2017-09-16 LAB — CBC WITH DIFFERENTIAL/PLATELET
Basophils Absolute: 41 cells/uL (ref 0–200)
Basophils Relative: 0.9 %
Eosinophils Absolute: 133 cells/uL (ref 15–500)
Eosinophils Relative: 2.9 %
HCT: 42.5 % (ref 38.5–50.0)
Hemoglobin: 13.8 g/dL (ref 13.2–17.1)
Lymphs Abs: 1891 cells/uL (ref 850–3900)
MCH: 27.5 pg (ref 27.0–33.0)
MCHC: 32.5 g/dL (ref 32.0–36.0)
MCV: 84.8 fL (ref 80.0–100.0)
MPV: 10.2 fL (ref 7.5–12.5)
Monocytes Relative: 10.8 %
Neutro Abs: 2038 cells/uL (ref 1500–7800)
Neutrophils Relative %: 44.3 %
Platelets: 306 10*3/uL (ref 140–400)
RBC: 5.01 10*6/uL (ref 4.20–5.80)
RDW: 12.8 % (ref 11.0–15.0)
Total Lymphocyte: 41.1 %
WBC mixed population: 497 cells/uL (ref 200–950)
WBC: 4.6 10*3/uL (ref 3.8–10.8)

## 2017-09-16 LAB — COMPLETE METABOLIC PANEL WITH GFR
AG Ratio: 1.6 (calc) (ref 1.0–2.5)
ALT: 28 U/L (ref 9–46)
AST: 22 U/L (ref 10–35)
Albumin: 4.2 g/dL (ref 3.6–5.1)
Alkaline phosphatase (APISO): 105 U/L (ref 40–115)
BUN/Creatinine Ratio: 12 (calc) (ref 6–22)
BUN: 15 mg/dL (ref 7–25)
CO2: 31 mmol/L (ref 20–32)
Calcium: 9.4 mg/dL (ref 8.6–10.3)
Chloride: 104 mmol/L (ref 98–110)
Creat: 1.3 mg/dL — ABNORMAL HIGH (ref 0.70–1.25)
GFR, Est African American: 65 mL/min/{1.73_m2} (ref >=60)
GFR, Est Non African American: 56 mL/min/{1.73_m2} — ABNORMAL LOW (ref >=60)
Globulin: 2.7 g/dL (calc) (ref 1.9–3.7)
Glucose, Bld: 106 mg/dL — ABNORMAL HIGH (ref 65–99)
Potassium: 4.4 mmol/L (ref 3.5–5.3)
Sodium: 142 mmol/L (ref 135–146)
Total Bilirubin: 0.6 mg/dL (ref 0.2–1.2)
Total Protein: 6.9 g/dL (ref 6.1–8.1)

## 2017-09-16 LAB — HEPATITIS C ANTIBODY
Hepatitis C Ab: NONREACTIVE
SIGNAL TO CUT-OFF: 0.16 (ref <1.00)

## 2017-09-16 LAB — HEMOGLOBIN A1C
Hgb A1c MFr Bld: 6.4 % of total Hgb — ABNORMAL HIGH (ref <5.7)
Mean Plasma Glucose: 137 (calc)
eAG (mmol/L): 7.6 (calc)

## 2017-09-16 LAB — PSA, TOTAL WITH REFLEX TO PSA, FREE: PSA, Total: 1.1 ng/mL (ref <=4.0)

## 2017-09-21 ENCOUNTER — Encounter: Payer: Self-pay | Admitting: Family Medicine

## 2017-09-21 ENCOUNTER — Other Ambulatory Visit: Payer: Self-pay | Admitting: Family Medicine

## 2017-09-21 ENCOUNTER — Ambulatory Visit (INDEPENDENT_AMBULATORY_CARE_PROVIDER_SITE_OTHER): Payer: Medicare HMO | Admitting: Family Medicine

## 2017-09-21 VITALS — BP 132/78 | HR 58 | Temp 98.5°F | Resp 16 | Ht 71.0 in | Wt 173.0 lb

## 2017-09-21 DIAGNOSIS — I10 Essential (primary) hypertension: Secondary | ICD-10-CM | POA: Diagnosis not present

## 2017-09-21 DIAGNOSIS — G479 Sleep disorder, unspecified: Secondary | ICD-10-CM | POA: Diagnosis not present

## 2017-09-21 DIAGNOSIS — E7849 Other hyperlipidemia: Secondary | ICD-10-CM | POA: Diagnosis not present

## 2017-09-21 DIAGNOSIS — N183 Chronic kidney disease, stage 3 unspecified: Secondary | ICD-10-CM

## 2017-09-21 DIAGNOSIS — R7303 Prediabetes: Secondary | ICD-10-CM | POA: Diagnosis not present

## 2017-09-21 DIAGNOSIS — Z0000 Encounter for general adult medical examination without abnormal findings: Secondary | ICD-10-CM | POA: Diagnosis not present

## 2017-09-21 DIAGNOSIS — R7309 Other abnormal glucose: Secondary | ICD-10-CM

## 2017-09-21 DIAGNOSIS — N182 Chronic kidney disease, stage 2 (mild): Secondary | ICD-10-CM | POA: Insufficient documentation

## 2017-09-21 NOTE — Progress Notes (Signed)
Subjective:    [name], male    DOB: 26-Aug-1950, 67 y.o.   MRN: [id number]  Jesse Figueroa is a 67 y.o. male presenting on 09/21/2017 for Annual Exam   HPI   Elevated A1c / Pre-Diabetes Recent trend A1c 6.3 to 6.5, last reading 6.4 CBGs: Not check CBG at home Meds: None (never on) Currently on ARB Lifestyle: - Diet - He eats smaller portions regularly. Since last visit increased water intake, reduced sweet tea and juice. He is not eating as much starch or carbs either. Not eating much bread or biscuits. Limited friend foods. - Exercise (Most morning stationary bike at home 30 minutes, yard work and Therapist, art, Scientist, water quality at [employer], walks 6-7 miles often) - Family history of DM, Mother Denies hypoglycemia  CHRONIC HTN: Checking BP at home, ranging from 127 to 143 SBP, avg in 130s / 80s Current Meds - Losartan 100mg  daily, Propranolol 20mg  BID Reports good compliance, took meds today. Tolerating well, w/o complaints.  CKD-III Prior history of elevated creatinine, with history of HTN and DM. He tries to stay well hydrated. Not taking NSAIDs or other concerns.  HYPERLIPIDEMIA: - Reports no concerns. Last lipid panel 09/2017, controlled  - Currently taking Atorvastatin 10mg , tolerating well without side effects or myalgias  PMH - Bradycardia and Essential Tremor  Additional complaints / updates: - Insomnia: discussed last apt, seems to be similar problem, he has history of chronic insomnia, in past failed Trazodone 50mg  PRN. He prefers to not take medicine, asking what could cause this. He denies depression or anxiety or racing thoughts, seems to be doing well with physical exertion during day, seems to watch TV at night to help. He has not tried melatonin. He will lay awake in bed often, wife sleeps easier, he is frustrated   Health Maintenance:  Up to date routine Hepatitis C screening, lab negative 09/2017.  Prostate CA Screening: Last PSA  1.1 (09/2017). Currently asymptomatic. No known family history of prostate CA.  Colon CA Screening: Last Colonoscopy (done by Dr Doylene Can[facility]), results with x 2 polyps hyperplastic, good for 5-10 years or per recommendation of his GI. Currently asymptomatic. No known family history of colon CA.   Depression screen [facility] 2/9 09/21/2017 09/15/2017 06/17/2017  Decreased Interest 0 0 0  Down, Depressed, Hopeless 0 0 0  PHQ - 2 Score 0 0 0    Past Medical History:  Diagnosis Date  . Hyperlipidemia   . Hypertension   . Tremor    Past Surgical History:  Procedure Laterality Date  . COLONOSCOPY WITH PROPOFOL N/A 02/05/2016   Procedure: COLONOSCOPY WITH PROPOFOL;  Surgeon: Lollie Sails, MD;  Location: [facility] ENDOSCOPY;  Service: Endoscopy;  Laterality: N/A;   Social History   Socioeconomic History  . Marital status: Married    Spouse name: Not on file  . Number of children: Not on file  . Years of education: Not on file  . Highest education level: Not on file  Occupational History  . Occupation: Psychologist, clinical    Comment: Pension scheme manager  Social Needs  . Financial resource strain: Not hard at all  . Food insecurity:    Worry: Never true    Inability: Never true  . Transportation needs:    Medical: No    Non-medical: No  Tobacco Use  . Smoking status: Never Smoker  . Smokeless tobacco: Never Used  Substance and Sexual Activity  . Alcohol  use: No  . Drug use: No  . Sexual activity: Not on file  Lifestyle  . Physical activity:    Days per week: 7 days    Minutes per session: 30 min  . Stress: Not at all  Relationships  . Social connections:    Talks on phone: More than three times a week    Gets together: More than three times a week    Attends religious service: Never    Active member of club or organization: Yes    Attends meetings of clubs or organizations: More than 4 times per year    Relationship status: Married  . Intimate partner violence:    Fear  of current or ex partner: No    Emotionally abused: No    Physically abused: No    Forced sexual activity: No  Other Topics Concern  . Not on file  Social History Narrative   Track coach 5 days a week    Family History  Problem Relation Age of Onset  . Heart disease Mother   . Hypertension Mother   . Diabetes Mother   . Cancer Father    Current Outpatient Medications on File Prior to Visit  Medication Sig  . atorvastatin (LIPITOR) 10 MG tablet Take 1 tablet (10 mg total) by mouth daily at 6 PM.  . losartan (COZAAR) 100 MG tablet Take 1 tablet (100 mg total) by mouth daily.  . propranolol (INDERAL) 20 MG tablet Take 1 tablet (20 mg total) by mouth 2 (two) times daily.   No current facility-administered medications on file prior to visit.     Review of Systems  Constitutional: Negative for activity change, appetite change, chills, diaphoresis, fatigue and fever.  HENT: Negative for congestion and hearing loss.   Eyes: Negative for visual disturbance.  Respiratory: Negative for apnea, cough, choking, chest tightness, shortness of breath and wheezing.   Cardiovascular: Negative for chest pain, palpitations and leg swelling.  Gastrointestinal: Negative for abdominal pain, constipation, diarrhea, nausea and vomiting.  Endocrine: Negative for cold intolerance and polyuria.  Genitourinary: Negative for decreased urine volume, difficulty urinating, dysuria, frequency, hematuria, scrotal swelling, testicular pain and urgency.  Musculoskeletal: Negative for arthralgias and neck pain.  Skin: Negative for rash.  Allergic/Immunologic: Negative for environmental allergies.  Neurological: Negative for dizziness, weakness, light-headedness, numbness and headaches.  Hematological: Negative for adenopathy.  Psychiatric/Behavioral: Positive for sleep disturbance. Negative for behavioral problems and dysphoric mood. The patient is not nervous/anxious.    Per HPI unless specifically indicated  above     Objective:    BP 132/78   Pulse (!) 58   Temp 98.5 F (36.9 C) (Oral)   Resp 16   Ht 5\' 11"  (1.803 m)   Wt 173 lb (78.5 kg)   BMI 24.13 kg/m   Wt Readings from Last 3 Encounters:  09/21/17 173 lb (78.5 kg)  09/15/17 176 lb (79.8 kg)  06/17/17 180 lb (81.6 kg)    Physical Exam  Constitutional: He is oriented to person, place, and time. He appears well-developed and well-nourished. No distress.  Well-appearing, comfortable, cooperative  HENT:  Head: Normocephalic and atraumatic.  Mouth/Throat: Oropharynx is clear and moist.  Frontal / maxillary sinuses non-tender. Nares patent without purulence or edema. Bilateral TMs partially obscured by soft cerumen. Oropharynx clear without erythema, exudates, edema or asymmetry.  Eyes: Pupils are equal, round, and reactive to light. Conjunctivae and EOM are normal. Right eye exhibits no discharge. Left eye exhibits no discharge.  Neck:  Normal range of motion. Neck supple. No thyromegaly present.  Cardiovascular: Normal rate, regular rhythm, normal heart sounds and intact distal pulses.  No murmur heard. Pulmonary/Chest: Effort normal and breath sounds normal. No respiratory distress. He has no wheezes. He has no rales.  Abdominal: Soft. Bowel sounds are normal. He exhibits no distension and no mass. There is no tenderness.  Musculoskeletal: Normal range of motion. He exhibits no edema or tenderness.  Upper / Lower Extremities: - Normal muscle tone, strength bilateral upper extremities 5/5, lower extremities 5/5  Lymphadenopathy:    He has no cervical adenopathy.  Neurological: He is alert and oriented to person, place, and time.  Distal sensation intact to light touch all extremities  Skin: Skin is warm and dry. No rash noted. He is not diaphoretic. No erythema.  Psychiatric: He has a normal mood and affect. His behavior is normal.  Well groomed, good eye contact, normal speech and thoughts  Nursing note and vitals reviewed.       Recent Labs    06/17/17 1008 09/15/17 0946  HGBA1C 6.5* 6.4*    Results for orders placed or performed in visit on 09/15/17  Hepatitis C antibody  Result Value Ref Range   Hepatitis C Ab NON-REACTIVE NON-REACTI   SIGNAL TO CUT-OFF 0.16 <1.00  CBC with Differential/Platelet  Result Value Ref Range   WBC 4.6 3.8 - 10.8 Thousand/uL   RBC 5.01 4.20 - 5.80 Million/uL   Hemoglobin 13.8 13.2 - 17.1 g/dL   HCT 42.5 38.5 - 50.0 %   MCV 84.8 80.0 - 100.0 fL   MCH 27.5 27.0 - 33.0 pg   MCHC 32.5 32.0 - 36.0 g/dL   RDW 12.8 11.0 - 15.0 %   Platelets 306 140 - 400 Thousand/uL   MPV 10.2 7.5 - 12.5 fL   Neutro Abs 2,038 1,500 - 7,800 cells/uL   Lymphs Abs 1,891 850 - 3,900 cells/uL   WBC mixed population 497 200 - 950 cells/uL   Eosinophils Absolute 133 15 - 500 cells/uL   Basophils Absolute 41 0 - 200 cells/uL   Neutrophils Relative % 44.3 %   Total Lymphocyte 41.1 %   Monocytes Relative 10.8 %   Eosinophils Relative 2.9 %   Basophils Relative 0.9 %  Hemoglobin A1c  Result Value Ref Range   Hgb A1c MFr Bld 6.4 (H) <5.7 % of total Hgb   Mean Plasma Glucose 137 (calc)   eAG (mmol/L) 7.6 (calc)  Lipid panel  Result Value Ref Range   Cholesterol 138 <200 mg/dL   HDL 45 >40 mg/dL   Triglycerides 73 <150 mg/dL   LDL Cholesterol (Calc) 78 mg/dL (calc)   Total CHOL/HDL Ratio 3.1 <5.0 (calc)   Non-HDL Cholesterol (Calc) 93 <130 mg/dL (calc)  COMPLETE METABOLIC PANEL WITH GFR  Result Value Ref Range   Glucose, Bld 106 (H) 65 - 99 mg/dL   BUN 15 7 - 25 mg/dL   Creat 1.30 (H) 0.70 - 1.25 mg/dL   GFR, Est Non African American 56 (L) > OR = 60 mL/min/1.68m2   GFR, Est African American 65 > OR = 60 mL/min/1.54m2   BUN/Creatinine Ratio 12 6 - 22 (calc)   Sodium 142 135 - 146 mmol/L   Potassium 4.4 3.5 - 5.3 mmol/L   Chloride 104 98 - 110 mmol/L   CO2 31 20 - 32 mmol/L   Calcium 9.4 8.6 - 10.3 mg/dL   Total Protein 6.9 6.1 - 8.1 g/dL   Albumin 4.2 3.6 -  5.1 g/dL   Globulin 2.7  1.9 - 3.7 g/dL (calc)   AG Ratio 1.6 1.0 - 2.5 (calc)   Total Bilirubin 0.6 0.2 - 1.2 mg/dL   Alkaline phosphatase (APISO) 105 40 - 115 U/L   AST 22 10 - 35 U/L   ALT 28 9 - 46 U/L  PSA, Total with Reflex to PSA, Free  Result Value Ref Range   PSA, Total 1.1 < OR = 4.0 ng/mL      Assessment & Plan:   Problem List Items Addressed This Visit    CKD (chronic kidney disease), stage III (HCC)    Secondary to HTN and Elevated A1c and age most likely Recent Cr trend had been relatively stable some fluctuation Cr 1.15 to 1.30 [place] on diagnosis Improve hydration Avoid NSAID Continue ARB Follow-up q 6 mo Cr trend      Difficulty sleeping    Uncertain etiology insomnia, seems to be more primary etiology Does not seem consistent with depression anxiety or behavioral health, also seems like stays active and appropriate sleep hygiene except often watching TV to help sleep. - May be more behavioral - Failed Trazodone in past  Plan Handout on sleep hygiene Offered OTC Melatonin trial 1mg  titrate up as needed Follow-up specifically for this issue if not improving in future      Hyperlipidemia    Controlled cholesterol on statin and lifestyle Last lipid panel 08/2017  Plan: 1. Continue current meds - Atorvastatin 10mg  daily 2. Future consider ASA 81mg  for primary ASCVD risk reduction 3. Encourage improved lifestyle - low carb/cholesterol, reduce portion size, continue improving regular exercise      Hypertension    Controlled HTN - Home BP readings appropriate  Complication with CKD-III   Plan:  1. Continue current BP regimen - Losartan 100mg  daily, Propranolol 20mg  BID 2. Encourage improved lifestyle - low sodium diet, regular exercise 3. Continue monitor BP outside office, bring readings to next visit, if persistently >140/90 or new symptoms notify office sooner 4. Follow-up in 6 months Cr trend      Pre-diabetes    Still elevated A1c, similar to previous 6.4 from 6.5.  Concern PreDM at risk of DM Concern with HTN. Fam history of DM  Plan:  1. Not on any therapy currently - counseling today on med options offered Metformin agree to defer for one more check if still >6.3 will offer Metformin, he is hesitant about side effects 2. Encourage improved lifestyle - glycemic index handout given, low carb, low sugar diet, reduce portion size, continue improving regular exercise 3. Follow-up 6 months repeat lab serum A1c       Other Visit Diagnoses    Annual physical exam    -  Primary Reviewed and updated health maintenance Reviewed labs Encourage keep up improved diet and exercise lifestyle      No orders of the defined types were placed in this encounter.   Follow up plan: Return in about 6 months (around 03/23/2018) for PreDM (lab a1c review), HTN, CKD, Insomnia.   Future labs ordered for BMET, A1c in 6 months  [name], [facility] 09/21/2017, 1:21 PM

## 2017-09-21 NOTE — Patient Instructions (Addendum)
Thank you for coming to the office today.  A1c 6.4, previously 6.3 to 6.5, keep up the great work overall - I think you are maintaining sugar, but I am concerned about genetics and family history.  Next step if needed we could consider a low dose Metformin - either 500mg  24 hour pill once daily or regular 12 hour pill once or twice daily in future if need to control. We will wait for now.  -------------------  Creatinine 1.3, similar to result >1 year ago, consistent with some mild reduced chronic kidney function, but not appear to be any worse. Keep up hydration and this should remain stable  --------------------------------------------  Sleep Hygiene Recommendations to promote healthy sleep in all patients, especially if symptoms of insomnia are worsening. Due to the nature of sleep rhythms, if your body gets "out of rhythm", it may take some time before your sleep cycle can be "reset".  Please try to follow as many of the following tips as you can, usually there are only a few of these are the primary cause of the problem.  ?To reset your sleep rhythm, go to bed and get up at the same time every day ?Sleep only long enough to feel rested and then get out of bed ?Do not try to force yourself to sleep. If you can't sleep, get out of bed and try again later. ?Avoid naps during the day, unless excessively tired. The more sleeping during the day, then the less sleep your body needs at night.  ?Have coffee, tea, and other foods that have caffeine only in the morning ?Exercise several days a week, but not right before bed ?If you drink alcohol, prefer to have appropriate drink with one meal, but prefer to avoid alcohol in the evening, and bedtime ?If you smoke, avoid smoking, especially in the evening  ?Avoid watching TV or looking at phones, computers, or reading devices ("e-books") that give off light at least 30 minutes before bed. This artificial light sends "awake signals" to your brain  and can make it harder to fall asleep. ?Make your bedroom a comfortable place where it is easy to fall asleep: ? Put up shades or special blackout curtains to block light from outside. ? Use a white noise machine to block noise. ? Keep the temperature cool. ?Try your best to solve or at least address your problems before you go to bed ?Use relaxation techniques to manage stress. Ask your health care provider to suggest some techniques that may work well for you. These may include: ? Breathing exercises. ? Routines to release muscle tension. ? Visualizing peaceful scenes.   Recommend Melatonin OTC supplement - 1mg  nightly before bed can gradually increase to 5mg  in future if needed over several days to weeks.  DUE for FASTING BLOOD WORK (no food or drink after midnight before the lab appointment, only water or coffee without cream/sugar on the morning of)  SCHEDULE "Lab Only" visit in the morning at the clinic for lab draw in 6 MONTHS   - Make sure Lab Only appointment is at about 1 week before your next appointment, so that results will be available  For Lab Results, once available within 2-3 days of blood draw, you can can log in to MyChart online to view your results and a brief explanation. Also, we can discuss results at next follow-up visit.  Please schedule a Follow-up Appointment to: Return in about 6 months (around 03/23/2018) for PreDM (lab a1c review), HTN, CKD, Insomnia.  If you have any other questions or concerns, please feel free to call the office or send a message through [place]. You may also schedule an earlier appointment if necessary.  Additionally, you may be receiving a survey about your experience at our office within a few days to 1 week by e-mail or mail. We value your feedback.  [name], DO [place]

## 2017-09-21 NOTE — Assessment & Plan Note (Signed)
Controlled cholesterol on statin and lifestyle Last lipid panel 08/2017  Plan: 1. Continue current meds - Atorvastatin 10mg  daily 2. Future consider ASA 81mg  for primary ASCVD risk reduction 3. Encourage improved lifestyle - low carb/cholesterol, reduce portion size, continue improving regular exercise

## 2017-09-21 NOTE — Assessment & Plan Note (Signed)
Uncertain etiology insomnia, seems to be more primary etiology Does not seem consistent with depression anxiety or behavioral health, also seems like stays active and appropriate sleep hygiene except often watching TV to help sleep. - May be more behavioral - Failed Trazodone in past  Plan Handout on sleep hygiene Offered OTC Melatonin trial 1mg  titrate up as needed Follow-up specifically for this issue if not improving in future

## 2017-09-21 NOTE — Assessment & Plan Note (Signed)
Controlled HTN - Home BP readings appropriate  Complication with CKD-III   Plan:  1. Continue current BP regimen - Losartan 100mg  daily, Propranolol 20mg  BID 2. Encourage improved lifestyle - low sodium diet, regular exercise 3. Continue monitor BP outside office, bring readings to next visit, if persistently >140/90 or new symptoms notify office sooner 4. Follow-up in 6 months Cr trend

## 2017-09-21 NOTE — Assessment & Plan Note (Signed)
Secondary to HTN and Elevated A1c and age most likely Recent Cr trend had been relatively stable some fluctuation Cr 1.15 to 1.30 [place] on diagnosis Improve hydration Avoid NSAID Continue ARB Follow-up q 6 mo Cr trend

## 2017-09-21 NOTE — Assessment & Plan Note (Signed)
Still elevated A1c, similar to previous 6.4 from 6.5. Concern PreDM at risk of DM Concern with HTN. Fam history of DM  Plan:  1. Not on any therapy currently - counseling today on med options offered Metformin agree to defer for one more check if still >6.3 will offer Metformin, he is hesitant about side effects 2. Encourage improved lifestyle - glycemic index handout given, low carb, low sugar diet, reduce portion size, continue improving regular exercise 3. Follow-up 6 months repeat lab serum A1c

## 2017-10-09 ENCOUNTER — Encounter: Payer: Medicare HMO | Admitting: Family Medicine

## 2017-12-22 ENCOUNTER — Other Ambulatory Visit: Payer: Self-pay | Admitting: Family Medicine

## 2017-12-22 DIAGNOSIS — I10 Essential (primary) hypertension: Secondary | ICD-10-CM

## 2017-12-31 ENCOUNTER — Other Ambulatory Visit: Payer: Self-pay

## 2017-12-31 ENCOUNTER — Ambulatory Visit (INDEPENDENT_AMBULATORY_CARE_PROVIDER_SITE_OTHER): Payer: Medicare HMO | Admitting: Family Medicine

## 2017-12-31 ENCOUNTER — Encounter: Payer: Self-pay | Admitting: Family Medicine

## 2017-12-31 VITALS — BP 123/77 | HR 54 | Temp 98.2°F | Ht 71.0 in | Wt 168.8 lb

## 2017-12-31 DIAGNOSIS — R252 Cramp and spasm: Secondary | ICD-10-CM

## 2017-12-31 DIAGNOSIS — M62838 Other muscle spasm: Secondary | ICD-10-CM

## 2017-12-31 NOTE — Patient Instructions (Addendum)
Thank you for coming to the office today.   For muscle spasms and cramps  Most likely related to Atorvastatin cholesterol pill - HOLD this for 2-4 weeks to see if symptoms resolve - if they persist after you stop this med you can restart it and notify me otherwise if they go away completely when you hold the pill - we would consider switching to Rosuvastatin or Crestor  Increase water hydration - can use gatorade as well in addition - less koolaid  - OTC natural option is Hyland's Leg Cramps (Dissolving tablet) take as needed for muscle cramps  - Rehydr8 - muscle supplement  Consider future lab tests if not improved - return for other blood tests such as thyroid, sugar, magnesium kidney as well   Please schedule a Follow-up Appointment to: Return in about 1 month (around 01/28/2018), or if symptoms worsen or fail to improve, for muscle cramps spasms.  If you have any other questions or concerns, please feel free to call the office or send a message through [place]. You may also schedule an earlier appointment if necessary.  Additionally, you may be receiving a survey about your experience at our office within a few days to 1 week by e-mail or mail. We value your feedback.  [name], DO [place]

## 2017-12-31 NOTE — Progress Notes (Signed)
Subjective:    [name], male    DOB: December 19, 1950, 67 y.o.   MRN: [id number]  Jesse Figueroa is a 67 y.o. male presenting on 12/31/2017 for Spasms (intermittent spasms like pain that radiates all over, legs, back and groin area x 3 weeks)   HPI   MUSCLE SPASMS Reports new symptom with muscle aching or muscle spasm that seems episodic brief lasting few minutes or less, mild severity only 1 to 2 out of 10. Does not seem to be improving but not worsening either. Longest time period without symptoms was few days only. He cannot attribute it to any particular activity, but he does admit that he is out in the sun and active over past month, he is coaching high school track. He drinks mostly kool aid and gatorade and some water, tries to stay hydrated but not drinking excess water. He does have some occasional cramping of muscles in legs calves and hamstrings. - Admits occasional constipation - he coaches high school track and is often out in heat and active, recent long trips and travel to [place] on bus - History of Pre-Diabetes with elevated blood sugar, he is urinating more but drinking more fluids - History of Hyperlipidemia w/ Diabetes, he takes Atorvastatin 10mg  nightly for several years, no other meds of this type, has not had problem with side effect before - Denies any fevers, chills, muscle twitching, no new weakness or numbness or tingling, swelling redness or rash, dyspnea, myalgias, injury   Depression screen [facility] 2/9 09/21/2017 09/15/2017 06/17/2017  Decreased Interest 0 0 0  Down, Depressed, Hopeless 0 0 0  PHQ - 2 Score 0 0 0    Social History   Tobacco Use  . Smoking status: Never Smoker  . Smokeless tobacco: Never Used  Substance Use Topics  . Alcohol use: No  . Drug use: No    Review of Systems Per HPI unless specifically indicated above     Objective:    BP 123/77 (BP Location: Right Arm, Patient Position: Sitting, Cuff Size: Normal)   Pulse (!) 54    Temp 98.2 F (36.8 C) (Oral)   Ht 5\' 11"  (1.803 m)   Wt 168 lb 12.8 oz (76.6 kg)   BMI 23.54 kg/m   Wt Readings from Last 3 Encounters:  12/31/17 168 lb 12.8 oz (76.6 kg)  09/21/17 173 lb (78.5 kg)  09/15/17 176 lb (79.8 kg)    Physical Exam  Constitutional: He is oriented to person, place, and time. He appears well-developed and well-nourished. No distress.  Well-appearing, comfortable, cooperative, lean body habitus  HENT:  Head: Normocephalic and atraumatic.  Mouth/Throat: Oropharynx is clear and moist.  Eyes: Conjunctivae are normal. Right eye exhibits no discharge. Left eye exhibits no discharge.  Cardiovascular: Normal rate and intact distal pulses.  Pulmonary/Chest: Effort normal.  Musculoskeletal: Normal range of motion. He exhibits no edema.  Back normal without deformity or abnormal curvature.  Upper / Lower Extremities: - Normal muscle tone, strength bilateral upper extremities 5/5, lower extremities 5/5 - Bilateral shoulders, knees, wrist, ankles without deformity, tenderness, effusion - Normal Gait  No palpable muscle spasm or knot or abnormality  Neurological: He is alert and oriented to person, place, and time.  Skin: Skin is warm and dry. No rash noted. He is not diaphoretic. No erythema.  Psychiatric: He has a normal mood and affect. His behavior is normal.  Well groomed, good eye contact, normal speech and thoughts  Nursing note  and vitals reviewed.  Results for orders placed or performed in visit on 09/15/17  Hepatitis C antibody  Result Value Ref Range   Hepatitis C Ab NON-REACTIVE NON-REACTI   SIGNAL TO CUT-OFF 0.16 <1.00  CBC with Differential/Platelet  Result Value Ref Range   WBC 4.6 3.8 - 10.8 Thousand/uL   RBC 5.01 4.20 - 5.80 Million/uL   Hemoglobin 13.8 13.2 - 17.1 g/dL   HCT 42.5 38.5 - 50.0 %   MCV 84.8 80.0 - 100.0 fL   MCH 27.5 27.0 - 33.0 pg   MCHC 32.5 32.0 - 36.0 g/dL   RDW 12.8 11.0 - 15.0 %   Platelets 306 140 - 400 Thousand/uL    MPV 10.2 7.5 - 12.5 fL   Neutro Abs 2,038 1,500 - 7,800 cells/uL   Lymphs Abs 1,891 850 - 3,900 cells/uL   WBC mixed population 497 200 - 950 cells/uL   Eosinophils Absolute 133 15 - 500 cells/uL   Basophils Absolute 41 0 - 200 cells/uL   Neutrophils Relative % 44.3 %   Total Lymphocyte 41.1 %   Monocytes Relative 10.8 %   Eosinophils Relative 2.9 %   Basophils Relative 0.9 %  Hemoglobin A1c  Result Value Ref Range   Hgb A1c MFr Bld 6.4 (H) <5.7 % of total Hgb   Mean Plasma Glucose 137 (calc)   eAG (mmol/L) 7.6 (calc)  Lipid panel  Result Value Ref Range   Cholesterol 138 <200 mg/dL   HDL 45 >40 mg/dL   Triglycerides 73 <150 mg/dL   LDL Cholesterol (Calc) 78 mg/dL (calc)   Total CHOL/HDL Ratio 3.1 <5.0 (calc)   Non-HDL Cholesterol (Calc) 93 <130 mg/dL (calc)  COMPLETE METABOLIC PANEL WITH GFR  Result Value Ref Range   Glucose, Bld 106 (H) 65 - 99 mg/dL   BUN 15 7 - 25 mg/dL   Creat 1.30 (H) 0.70 - 1.25 mg/dL   GFR, Est Non African American 56 (L) > OR = 60 mL/min/1.30m2   GFR, Est African American 65 > OR = 60 mL/min/1.77m2   BUN/Creatinine Ratio 12 6 - 22 (calc)   Sodium 142 135 - 146 mmol/L   Potassium 4.4 3.5 - 5.3 mmol/L   Chloride 104 98 - 110 mmol/L   CO2 31 20 - 32 mmol/L   Calcium 9.4 8.6 - 10.3 mg/dL   Total Protein 6.9 6.1 - 8.1 g/dL   Albumin 4.2 3.6 - 5.1 g/dL   Globulin 2.7 1.9 - 3.7 g/dL (calc)   AG Ratio 1.6 1.0 - 2.5 (calc)   Total Bilirubin 0.6 0.2 - 1.2 mg/dL   Alkaline phosphatase (APISO) 105 40 - 115 U/L   AST 22 10 - 35 U/L   ALT 28 9 - 46 U/L  PSA, Total with Reflex to PSA, Free  Result Value Ref Range   PSA, Total 1.1 < OR = 4.0 ng/mL      Assessment & Plan:   Problem List Items Addressed This Visit    None    Visit Diagnoses    Muscle cramping    -  Primary   Muscle spasm          Uncertain exact etiology of brief episodic variable location muscle spasms, also has some cramping. Suspect most likely related to hydration and heat and  overuse muscles but some of areas he describes seem less likely for hydration/overuse. No obvious injury - Additional concern may be potential side effect related to Statin therapy  Plan - First  may try rehydration, increase water, limit koolaid but can drink some low calorie gatorade for electrolytes, may try OTC supplement for muscle health limit cramps and symptoms with Hyland's Leg Cramps or Rehydr8 - If not improving then next step is to HOLD Atorvastatin 10mg  to see if this was potentially causing his muscle symptoms, he may hold for 2-4 weeks to see if it resolves, if it does then he can notify us and we will switch to alternative med such as Rosuvastatin and adjust dose if need - may need intermittent dosing - if it does not stop his symptoms, then he can resume his existing med - Future we may consider other work-up with chemistry, magnesium, TSH - he had other routine labs 09/2017 that were unremarkable  No orders of the defined types were placed in this encounter.   Follow up plan: Return in about 1 month (around 01/28/2018), or if symptoms worsen or fail to improve, for muscle cramps spasms.  [name], [facility] 12/31/2017, 12:49 PM

## 2018-02-04 ENCOUNTER — Encounter: Payer: Self-pay | Admitting: Family Medicine

## 2018-02-04 ENCOUNTER — Ambulatory Visit (INDEPENDENT_AMBULATORY_CARE_PROVIDER_SITE_OTHER): Payer: Medicare HMO | Admitting: Family Medicine

## 2018-02-04 VITALS — BP 120/72 | HR 55 | Temp 98.6°F | Resp 16 | Ht 71.0 in | Wt 170.0 lb

## 2018-02-04 DIAGNOSIS — J3089 Other allergic rhinitis: Secondary | ICD-10-CM

## 2018-02-04 DIAGNOSIS — K30 Functional dyspepsia: Secondary | ICD-10-CM | POA: Diagnosis not present

## 2018-02-04 DIAGNOSIS — R198 Other specified symptoms and signs involving the digestive system and abdomen: Secondary | ICD-10-CM

## 2018-02-04 DIAGNOSIS — K5900 Constipation, unspecified: Secondary | ICD-10-CM | POA: Diagnosis not present

## 2018-02-04 MED ORDER — FLUTICASONE PROPIONATE 50 MCG/ACT NA SUSP: 2.0000 | Freq: Every day | NASAL | 1 refills | Status: DC

## 2018-02-04 NOTE — Progress Notes (Signed)
Subjective:    [name], male    DOB: [birth year], 67 y.o.   MRN: [id number]  Jesse Figueroa is a 67 y.o. male presenting on 02/04/2018 for Abdominal Pain (as per patient stomach hurts)   HPI   Abdominal Discomfort / Possible Constipation - Reports symptoms onset >3-4 weeks ago with worsening constellation of abdominal symptoms. He describes constipation symptoms, in past he has had this but it usually resolves now it is lingering. He has urge to go to the bathroom frequently. He has episodic sharp mild but bothersome brief episodes bilateral lower abdomen, even occurs at night as well or with any activity. Not always associated with bowel movements. He is able to tolerate regular meals and has good appetite, without problem - His normal bowel patterns in the past were 2-3 soft formed bowel movements - Not taking any OTC medications for bowels or abdominal symptoms currently. - No prior abdominal surgery. - Denies any stool color changes, dark stool or blood in stool, nausea vomiting  Globus Sensation / Chest Congestion Additional complaint onset few days ago, seems to be new problem. No known history of GERD or silent reflux before. He denies sinus congestion or cold symptoms. He states after he feels some drainage in sinus and in back of throat and irritation - He is outdoor often, no prior dx allergies or rhinitis - Denies dysphagia or choking, regurgitation nausea vomiting, sore throat   Depression screen [facility] 2/9 02/04/2018 09/21/2017 09/15/2017  Decreased Interest 0 0 0  Down, Depressed, Hopeless 0 0 0  PHQ - 2 Score 0 0 0    Social History   Tobacco Use  . Smoking status: Never Smoker  . Smokeless tobacco: Never Used  Substance Use Topics  . Alcohol use: No  . Drug use: No    Review of Systems Per HPI unless specifically indicated above     Objective:    BP 120/72   Pulse (!) 55   Temp 98.6 F (37 C) (Oral)   Resp 16   Ht 5\' 11"  (1.803 m)   Wt 170  lb (77.1 kg)   SpO2 100%   BMI 23.71 kg/m   Wt Readings from Last 3 Encounters:  02/04/18 170 lb (77.1 kg)  12/31/17 168 lb 12.8 oz (76.6 kg)  09/21/17 173 lb (78.5 kg)    Physical Exam  Constitutional: He is oriented to person, place, and time. He appears well-developed and well-nourished. No distress.  Well-appearing, comfortable, cooperative  HENT:  Head: Normocephalic and atraumatic.  Mouth/Throat: Oropharynx is clear and moist.  Frontal / maxillary sinuses non-tender. Nares patent with some turbinate edema with mild congestion without purulence. Bilateral TMs clear without erythema, effusion or bulging. Oropharynx clear without erythema, exudates, edema or asymmetry.  Eyes: Conjunctivae are normal. Right eye exhibits no discharge. Left eye exhibits no discharge.  Cardiovascular: Normal rate.  Pulmonary/Chest: Effort normal.  Abdominal: Soft. Normal appearance and bowel sounds are normal. He exhibits no distension, no abdominal bruit and no mass. There is no hepatosplenomegaly. There is no tenderness. There is no rebound, no guarding, no CVA tenderness, no tenderness at McBurney's point and negative Murphy's sign.  Musculoskeletal: He exhibits no edema.  Neurological: He is alert and oriented to person, place, and time.  Skin: Skin is warm and dry. No rash noted. He is not diaphoretic. No erythema.  Psychiatric: He has a normal mood and affect. His behavior is normal.  Well groomed, good eye contact, normal  speech and thoughts  Nursing note and vitals reviewed.  Results for orders placed or performed in visit on 09/15/17  Hepatitis C antibody  Result Value Ref Range   Hepatitis C Ab NON-REACTIVE NON-REACTI   SIGNAL TO CUT-OFF 0.16 <1.00  CBC with Differential/Platelet  Result Value Ref Range   WBC 4.6 3.8 - 10.8 Thousand/uL   RBC 5.01 4.20 - 5.80 Million/uL   Hemoglobin 13.8 13.2 - 17.1 g/dL   HCT 42.5 38.5 - 50.0 %   MCV 84.8 80.0 - 100.0 fL   MCH 27.5 27.0 - 33.0 pg    MCHC 32.5 32.0 - 36.0 g/dL   RDW 12.8 11.0 - 15.0 %   Platelets 306 140 - 400 Thousand/uL   MPV 10.2 7.5 - 12.5 fL   Neutro Abs 2,038 1,500 - 7,800 cells/uL   Lymphs Abs 1,891 850 - 3,900 cells/uL   WBC mixed population 497 200 - 950 cells/uL   Eosinophils Absolute 133 15 - 500 cells/uL   Basophils Absolute 41 0 - 200 cells/uL   Neutrophils Relative % 44.3 %   Total Lymphocyte 41.1 %   Monocytes Relative 10.8 %   Eosinophils Relative 2.9 %   Basophils Relative 0.9 %  Hemoglobin A1c  Result Value Ref Range   Hgb A1c MFr Bld 6.4 (H) <5.7 % of total Hgb   Mean Plasma Glucose 137 (calc)   eAG (mmol/L) 7.6 (calc)  Lipid panel  Result Value Ref Range   Cholesterol 138 <200 mg/dL   HDL 45 >40 mg/dL   Triglycerides 73 <150 mg/dL   LDL Cholesterol (Calc) 78 mg/dL (calc)   Total CHOL/HDL Ratio 3.1 <5.0 (calc)   Non-HDL Cholesterol (Calc) 93 <130 mg/dL (calc)  COMPLETE METABOLIC PANEL WITH GFR  Result Value Ref Range   Glucose, Bld 106 (H) 65 - 99 mg/dL   BUN 15 7 - 25 mg/dL   Creat 1.30 (H) 0.70 - 1.25 mg/dL   GFR, Est Non African American 56 (L) > OR = 60 mL/min/1.72m2   GFR, Est African American 65 > OR = 60 mL/min/1.50m2   BUN/Creatinine Ratio 12 6 - 22 (calc)   Sodium 142 135 - 146 mmol/L   Potassium 4.4 3.5 - 5.3 mmol/L   Chloride 104 98 - 110 mmol/L   CO2 31 20 - 32 mmol/L   Calcium 9.4 8.6 - 10.3 mg/dL   Total Protein 6.9 6.1 - 8.1 g/dL   Albumin 4.2 3.6 - 5.1 g/dL   Globulin 2.7 1.9 - 3.7 g/dL (calc)   AG Ratio 1.6 1.0 - 2.5 (calc)   Total Bilirubin 0.6 0.2 - 1.2 mg/dL   Alkaline phosphatase (APISO) 105 40 - 115 U/L   AST 22 10 - 35 U/L   ALT 28 9 - 46 U/L  PSA, Total with Reflex to PSA, Free  Result Value Ref Range   PSA, Total 1.1 < OR = 4.0 ng/mL      Assessment & Plan:   Problem List Items Addressed This Visit    None    Visit Diagnoses    Functional GI symptoms    -  Primary   Constipation, unspecified constipation type      Clinically reassuring  functional GI symptoms by his description and benign abdominal exam. Most likely seems to be flare of mixed constipation vs gas/cramping lower GI symptoms. However he is not having hard dry stools, but may be incomplete BMs because he still has recurrent urges to have BM  Plan  Trial Miralax instructions per AVS titrate up as needed Improve hydration, fiber Offered Dicyclomine - declined, reconsider in future Consider KUB - deferred today Follow-up if not improving     Seasonal allergic rhinitis due to other allergic trigger      Symptoms most consistent with rhinitis, with some post nasal drainage causing throat irritation and possible globus sensation. Suspect allergic trigger. Not characteristic of GERD but may be possible silent reflux with globus, no prior history. Not on treatment - No other GI red flag or other clues  Start nasal steroid Flonase 2 sprays in each nostril daily for 4-6 weeks, may repeat course seasonally or as needed Follow-up in future if not improve add trial H2 vs PPI    Relevant Medications   fluticasone (FLONASE) 50 MCG/ACT nasal spray      Meds ordered this encounter  Medications  . fluticasone (FLONASE) 50 MCG/ACT nasal spray    Sig: Place 2 sprays into both nostrils daily. Use for 4-6 weeks then stop and use seasonally or as needed.    Dispense:  16 g    Refill:  1      Follow up plan: Return in about 4 weeks (around 03/04/2018), or if symptoms worsen or fail to improve, for constipation/functional GI / rhinitis/throat.  [name], [facility] 02/04/2018, 3:27 PM

## 2018-02-04 NOTE — Patient Instructions (Addendum)
Thank you for coming to the office today.  For sensation in back of throat and sinus - LIkely allergic rhinitis or sinus related, drainage irritating back of throat - Start nasal steroid Flonase 2 sprays in each nostril daily for 4-6 weeks, may repeat course seasonally or as needed - May take allergy pill OTC as well if interested (Claritin, Zyrteec, Allegra) pick one and take one a day if extra is needed for sinus  Future we may consider silent reflux treatment - look up "globus" sensation to see what I am referring to, may need stomach acid blocker - call if needed  For Constipation (less frequent bowel movement that can be hard dry or involve straining).  Recommend trying OTC Miralax 17g = 1 capful in large glass water once daily for now, try several days to see if working, goal is soft stool or BM 1-2 times daily, if too loose then reduce dose or try every other day. If not effective may need to increase it to 2 doses at once in AM or may do 1 in morning and 1 in afternoon/evening  - This medicine is very safe and can be used often without any problem and will not make you dehydrated. It is good for use on AS NEEDED BASIS or even MAINTENANCE therapy for longer term for several days to weeks at a time to help regulate bowel movements  Other more natural remedies or preventative treatment: - Increase hydration with water - Increase fiber in diet (high fiber foods = vegetables, leafy greens, oats/grains) - May take OTC Fiber supplement (metamucil powder or pill/gummy) - May try OTC Probiotic  In future we can consider abdominal X-ray or imaging or cramping medicine to help symptoms or 2nd opinion  Please schedule a Follow-up Appointment to: Return in about 4 weeks (around 03/04/2018), or if symptoms worsen or fail to improve, for constipation/functional GI / rhinitis/throat.  If you have any other questions or concerns, please feel free to call the office or send a message through [place].  You may also schedule an earlier appointment if necessary.  Additionally, you may be receiving a survey about your experience at our office within a few days to 1 week by e-mail or mail. We value your feedback.  [name], DO [place]

## 2018-03-23 ENCOUNTER — Ambulatory Visit (INDEPENDENT_AMBULATORY_CARE_PROVIDER_SITE_OTHER): Payer: Medicare HMO | Admitting: Family Medicine

## 2018-03-23 ENCOUNTER — Encounter: Payer: Self-pay | Admitting: Family Medicine

## 2018-03-23 VITALS — BP 130/78 | HR 54 | Temp 98.0°F | Resp 16 | Ht 71.0 in | Wt 173.0 lb

## 2018-03-23 DIAGNOSIS — F5101 Primary insomnia: Secondary | ICD-10-CM

## 2018-03-23 DIAGNOSIS — I10 Essential (primary) hypertension: Secondary | ICD-10-CM | POA: Diagnosis not present

## 2018-03-23 DIAGNOSIS — N183 Chronic kidney disease, stage 3 unspecified: Secondary | ICD-10-CM

## 2018-03-23 DIAGNOSIS — J3089 Other allergic rhinitis: Secondary | ICD-10-CM | POA: Diagnosis not present

## 2018-03-23 DIAGNOSIS — R7303 Prediabetes: Secondary | ICD-10-CM | POA: Diagnosis not present

## 2018-03-23 NOTE — Progress Notes (Signed)
Subjective:    [name], male    DOB: [birth year], 67 y.o.   MRN: [id number]  Jesse Figueroa is a 67 y.o. male presenting on 03/23/2018 for Hypertension; Pre-Diabetes; and Insomnia   HPI   Elevated A1c / Pre-Diabetes Recent trend A1c elevated 6.3 to 6.5 in recent history, now 6 months later he has tried to improve it. CBGs:Not check CBG at home Meds:None (never on) Currently on ARB Lifestyle: - Diet - no major changes since prior visit, still improving diet low carb, increased water and reduced sugary drinks - Exercise (Most morning stationary bike at home 30 minutes, yard work and Therapist, art, Scientist, water quality at [employer], walks 6-7 miles often) - Family history of DM, Mother Denies hypoglycemia, polyuria, polydipsia  CHRONIC HTN: Checking BP at home, similar readings previously 130s/80s Current Meds -Losartan 100mg  daily, Propranolol 20mg  BID Reports good compliance, took meds today. Tolerating well, w/o complaints. Denies CP, dyspnea, HA, edema, dizziness / lightheadedness  CKD-III Prior visit had elevated Creatinine to 1.3. He is due for return Chemistry for Cr. Today admits not quite as well hydrated otherwise he drinks normal amount of water. Not taking NSAID  Follow-up Abdominal Discomfort / Constipation - Last visit with me 02/04/18, for initial visit for same problem, treated with reassurance likely functional GI symptom and given advice for OTC Miralax trial - deferred dicyclomine or other testing, see prior notes for background information. - Interval update with problem has RESOLVED spontaneously he purchased miralax but never started it - Today no new concerns Denies abdominal pain, constipation, diarrhea, dark stool  RESOLVED - Globus Sensation / Allergic Rhinitis - Last visit with me 02/04/18, for initial visit for same problem , treated with trial on Flonase, see prior notes for background information. - Interval update with dramatic  improvement and resolution within first week of treatment, then he stopped using Flonase - Today patient reports doing well no concerns. - He does not think it was acid reflux - Denies dysphagia or choking, regurgitation nausea vomiting, sore throat, cough, productive cough, globus sensation  INSOMNIA, Chronic Reports chronic problem insomnia mostly sleep maintenance. In past has failed Trazodone 50mg , does not think he took higher doses, prefes avoid rx med and avoid habit forming meds. - He has tried sleep hygiene without ideal results - Interested in [place], otherwise he does not seem concerned about this problem  Health Maintenance: UTD Already Flu Vaccine this season.  Depression screen [facility] 2/9 03/23/2018 02/04/2018 09/21/2017  Decreased Interest 0 0 0  Down, Depressed, Hopeless 0 0 0  PHQ - 2 Score 0 0 0    Social History   Tobacco Use  . Smoking status: Never Smoker  . Smokeless tobacco: Never Used  Substance Use Topics  . Alcohol use: No  . Drug use: No    Review of Systems Per HPI unless specifically indicated above     Objective:    BP 130/78   Pulse (!) 54   Temp 98 F (36.7 C) (Oral)   Resp 16   Ht 5\' 11"  (1.803 m)   Wt 173 lb (78.5 kg)   BMI 24.13 kg/m   Wt Readings from Last 3 Encounters:  03/23/18 173 lb (78.5 kg)  02/04/18 170 lb (77.1 kg)  12/31/17 168 lb 12.8 oz (76.6 kg)    Physical Exam Results for orders placed or performed in visit on 09/15/17  Hepatitis C antibody  Result Value Ref Range   Hepatitis C  Ab NON-REACTIVE NON-REACTI   SIGNAL TO CUT-OFF 0.16 <1.00  CBC with Differential/Platelet  Result Value Ref Range   WBC 4.6 3.8 - 10.8 Thousand/uL   RBC 5.01 4.20 - 5.80 Million/uL   Hemoglobin 13.8 13.2 - 17.1 g/dL   HCT 42.5 38.5 - 50.0 %   MCV 84.8 80.0 - 100.0 fL   MCH 27.5 27.0 - 33.0 pg   MCHC 32.5 32.0 - 36.0 g/dL   RDW 12.8 11.0 - 15.0 %   Platelets 306 140 - 400 Thousand/uL   MPV 10.2 7.5 - 12.5 fL   Neutro Abs 2,038 1,500  - 7,800 cells/uL   Lymphs Abs 1,891 850 - 3,900 cells/uL   WBC mixed population 497 200 - 950 cells/uL   Eosinophils Absolute 133 15 - 500 cells/uL   Basophils Absolute 41 0 - 200 cells/uL   Neutrophils Relative % 44.3 %   Total Lymphocyte 41.1 %   Monocytes Relative 10.8 %   Eosinophils Relative 2.9 %   Basophils Relative 0.9 %  Hemoglobin A1c  Result Value Ref Range   Hgb A1c MFr Bld 6.4 (H) <5.7 % of total Hgb   Mean Plasma Glucose 137 (calc)   eAG (mmol/L) 7.6 (calc)  Lipid panel  Result Value Ref Range   Cholesterol 138 <200 mg/dL   HDL 45 >40 mg/dL   Triglycerides 73 <150 mg/dL   LDL Cholesterol (Calc) 78 mg/dL (calc)   Total CHOL/HDL Ratio 3.1 <5.0 (calc)   Non-HDL Cholesterol (Calc) 93 <130 mg/dL (calc)  COMPLETE METABOLIC PANEL WITH GFR  Result Value Ref Range   Glucose, Bld 106 (H) 65 - 99 mg/dL   BUN 15 7 - 25 mg/dL   Creat 1.30 (H) 0.70 - 1.25 mg/dL   GFR, Est Non African American 56 (L) > OR = 60 mL/min/1.45m2   GFR, Est African American 65 > OR = 60 mL/min/1.53m2   BUN/Creatinine Ratio 12 6 - 22 (calc)   Sodium 142 135 - 146 mmol/L   Potassium 4.4 3.5 - 5.3 mmol/L   Chloride 104 98 - 110 mmol/L   CO2 31 20 - 32 mmol/L   Calcium 9.4 8.6 - 10.3 mg/dL   Total Protein 6.9 6.1 - 8.1 g/dL   Albumin 4.2 3.6 - 5.1 g/dL   Globulin 2.7 1.9 - 3.7 g/dL (calc)   AG Ratio 1.6 1.0 - 2.5 (calc)   Total Bilirubin 0.6 0.2 - 1.2 mg/dL   Alkaline phosphatase (APISO) 105 40 - 115 U/L   AST 22 10 - 35 U/L   ALT 28 9 - 46 U/L  PSA, Total with Reflex to PSA, Free  Result Value Ref Range   PSA, Total 1.1 < OR = 4.0 ng/mL      Assessment & Plan:   Problem List Items Addressed This Visit    CKD (chronic kidney disease), stage III ([place]) - Primary    Secondary to HTN and Elevated A1c and age most likely Recent Cr trend had been relatively stable some fluctuation Cr 1.15 to 1.30  Plan Due for repeat chemistry trend q 6 mo today - however he does not seem to be extra well  hydrated, will defer for 1 week for hydration. Labs ordered future for 1 week Remain off NSAID Continue ARB Follow-up q 6 mo Cr trend likely next in 09/2018 with physical      Essential hypertension    Controlled HTN - Home BP readings appropriate  Complication with CKD-III    Plan:  1. Continue current BP regimen - Losartan 100mg  daily, Propranolol 20mg  BID 2. Encourage improved lifestyle - low sodium diet, regular exercise 3. Continue monitor BP outside office, bring readings to next visit, if persistently >140/90 or new symptoms notify office sooner 4. Will check chemistry in 1  Week for Cr trend - then q 6 months      Insomnia    Seems primary chronic insomnia, more sleep maintenance Does not seem consistent with depression anxiety or behavioral health, also seems like stays active and appropriate sleep hygiene except often watching TV to help sleep. - May be more behavioral - Failed Trazodone in past, low dose 50mg . Not improve sleep hygiene  Plan Start OTC Melatonin trial 1mg  titrate up as needed - max dose 5-10mg  Follow-up specifically for this issue if not improving in future - may offer rx medication Remeron vs hypnotic such as Ambien if needed for short-term he has concerns habit forming, would cautiously approach rx      Pre-diabetes    Due for A1c check, will return in 1 week for lab draw Prior Range 6.3 to 6.5 at risk of progression to DM Concern with HTN. Fam history of DM  Plan:  1. Not on any therapy currently - pending A1c if still >6.3 would again offer Metformin f/u with patient, he is cautious on side effect 2. Encourage improved lifestyle - glycemic index handout given, low carb, low sugar diet, reduce portion size, continue improving regular exercise 3. Follow-up 1 week lab then again 6 months repeat lab serum A1c with physical or sooner if needed       Other Visit Diagnoses    Seasonal allergic rhinitis due to other allergic trigger       Likely  etiology of globus sensation / congestion, now resolved on trial Flonase, has stopped, advised to repeat course longer if need      No orders of the defined types were placed in this encounter.   Follow up plan: Return in about 6 months (around 09/22/2018) for Annual Physical.  Future labs ordered in system already for BMET / A1c to be released and drawn at Lab Only in 1 week.  Pending review of upcoming labs in 1 week will then place future orders for Annual Physical in 09/2018 anticipated.  [name], [facility] 03/23/2018, 9:07 AM

## 2018-03-23 NOTE — Assessment & Plan Note (Signed)
Controlled HTN - Home BP readings appropriate  Complication with CKD-III    Plan:  1. Continue current BP regimen - Losartan 100mg  daily, Propranolol 20mg  BID 2. Encourage improved lifestyle - low sodium diet, regular exercise 3. Continue monitor BP outside office, bring readings to next visit, if persistently >140/90 or new symptoms notify office sooner 4. Will check chemistry in 1  Week for Cr trend - then q 6 months

## 2018-03-23 NOTE — Patient Instructions (Addendum)
Thank you for coming to the office today.  Try OTC Melatonin 1mg  at bedtime and gradually increase up to 5 to 10mg  max dose.  DUE for FASTING BLOOD WORK (no food or drink after midnight before the lab appointment, only water or coffee without cream/sugar on the morning of)  REMEMBER TO STAY EXTRA HYDRATED FEW DAYS BEFORE AND DAY OF LAB DRAW  SCHEDULE "Lab Only" visit in the morning at the clinic for lab draw in [place]   - Make sure Lab Only appointment is at about 1 week before your next appointment, so that results will be available  For Lab Results, once available within 2-3 days of blood draw, you can can log in to MyChart online to view your results and a brief explanation. Also, we can discuss results at next follow-up visit.   Please schedule a Follow-up Appointment to: Return in about 6 months (around 09/22/2018) for Annual Physical.  If you have any other questions or concerns, please feel free to call the office or send a message through [place]. You may also schedule an earlier appointment if necessary.  Additionally, you may be receiving a survey about your experience at our office within a few days to 1 week by e-mail or mail. We value your feedback.  [name], DO [place]

## 2018-03-23 NOTE — Assessment & Plan Note (Signed)
Secondary to HTN and Elevated A1c and age most likely Recent Cr trend had been relatively stable some fluctuation Cr 1.15 to 1.30  Plan Due for repeat chemistry trend q 6 mo today - however he does not seem to be extra well hydrated, will defer for 1 week for hydration. Labs ordered future for 1 week Remain off NSAID Continue ARB Follow-up q 6 mo Cr trend likely next in 09/2018 with physical

## 2018-03-23 NOTE — Assessment & Plan Note (Addendum)
Due for A1c check, will return in 1 week for lab draw Prior Range 6.3 to 6.5 at risk of progression to DM Concern with HTN. Fam history of DM  Plan:  1. Not on any therapy currently - pending A1c if still >6.3 would again offer Metformin f/u with patient, he is cautious on side effect 2. Encourage improved lifestyle - glycemic index handout given, low carb, low sugar diet, reduce portion size, continue improving regular exercise 3. Follow-up 1 week lab then again 6 months repeat lab serum A1c with physical or sooner if needed

## 2018-03-23 NOTE — Assessment & Plan Note (Signed)
Seems primary chronic insomnia, more sleep maintenance Does not seem consistent with depression anxiety or behavioral health, also seems like stays active and appropriate sleep hygiene except often watching TV to help sleep. - May be more behavioral - Failed Trazodone in past, low dose 50mg . Not improve sleep hygiene  Plan Start OTC Melatonin trial 1mg  titrate up as needed - max dose 5-10mg  Follow-up specifically for this issue if not improving in future - may offer rx medication Remeron vs hypnotic such as Ambien if needed for short-term he has concerns habit forming, would cautiously approach rx

## 2018-03-30 ENCOUNTER — Other Ambulatory Visit: Payer: Medicare HMO

## 2018-03-30 DIAGNOSIS — N183 Chronic kidney disease, stage 3 unspecified: Secondary | ICD-10-CM

## 2018-03-30 DIAGNOSIS — R7309 Other abnormal glucose: Secondary | ICD-10-CM

## 2018-03-30 DIAGNOSIS — R7303 Prediabetes: Secondary | ICD-10-CM

## 2018-03-30 DIAGNOSIS — I10 Essential (primary) hypertension: Secondary | ICD-10-CM

## 2018-03-31 LAB — BASIC METABOLIC PANEL WITH GFR
BUN: 15 mg/dL (ref 7–25)
CO2: 31 mmol/L (ref 20–32)
Calcium: 9.4 mg/dL (ref 8.6–10.3)
Chloride: 102 mmol/L (ref 98–110)
Creat: 1.17 mg/dL (ref 0.70–1.25)
GFR, Est African American: 73 mL/min/{1.73_m2} (ref >=60)
GFR, Est Non African American: 63 mL/min/{1.73_m2} (ref >=60)
Glucose, Bld: 108 mg/dL — ABNORMAL HIGH (ref 65–99)
Potassium: 4.6 mmol/L (ref 3.5–5.3)
Sodium: 138 mmol/L (ref 135–146)

## 2018-03-31 LAB — HEMOGLOBIN A1C
Hgb A1c MFr Bld: 6.3 % of total Hgb — ABNORMAL HIGH (ref <5.7)
Mean Plasma Glucose: 134 (calc)
eAG (mmol/L): 7.4 (calc)

## 2018-04-01 ENCOUNTER — Other Ambulatory Visit: Payer: Self-pay | Admitting: Family Medicine

## 2018-04-01 DIAGNOSIS — N183 Chronic kidney disease, stage 3 unspecified: Secondary | ICD-10-CM

## 2018-04-01 DIAGNOSIS — Z0000 Encounter for general adult medical examination without abnormal findings: Secondary | ICD-10-CM

## 2018-04-01 DIAGNOSIS — R351 Nocturia: Secondary | ICD-10-CM

## 2018-04-01 DIAGNOSIS — F5101 Primary insomnia: Secondary | ICD-10-CM

## 2018-04-01 DIAGNOSIS — I10 Essential (primary) hypertension: Secondary | ICD-10-CM

## 2018-04-01 DIAGNOSIS — R7303 Prediabetes: Secondary | ICD-10-CM

## 2018-04-01 DIAGNOSIS — E7849 Other hyperlipidemia: Secondary | ICD-10-CM

## 2018-05-11 DIAGNOSIS — H2513 Age-related nuclear cataract, bilateral: Secondary | ICD-10-CM

## 2018-05-11 DIAGNOSIS — H40023 Open angle with borderline findings, high risk, bilateral: Secondary | ICD-10-CM | POA: Diagnosis not present

## 2018-05-11 DIAGNOSIS — H527 Unspecified disorder of refraction: Secondary | ICD-10-CM | POA: Insufficient documentation

## 2018-05-11 DIAGNOSIS — H04123 Dry eye syndrome of bilateral lacrimal glands: Secondary | ICD-10-CM | POA: Insufficient documentation

## 2018-05-11 HISTORY — DX: Age-related nuclear cataract, bilateral: H25.13

## 2018-06-07 ENCOUNTER — Other Ambulatory Visit: Payer: Self-pay | Admitting: Family Medicine

## 2018-06-07 DIAGNOSIS — E7849 Other hyperlipidemia: Secondary | ICD-10-CM

## 2018-07-19 ENCOUNTER — Other Ambulatory Visit: Payer: Self-pay | Admitting: Family Medicine

## 2018-07-19 DIAGNOSIS — R251 Tremor, unspecified: Secondary | ICD-10-CM

## 2018-09-22 ENCOUNTER — Telehealth: Payer: Self-pay

## 2018-09-22 NOTE — Telephone Encounter (Signed)
Due to covid-19 pandemic, annual wellness visit was rescheduled from 09/28/2018 to 01/25/2019, patient confirmed. Will mail appt reminder per pt request

## 2018-09-28 ENCOUNTER — Ambulatory Visit: Payer: Self-pay

## 2018-10-13 ENCOUNTER — Telehealth: Payer: Self-pay

## 2018-10-13 DIAGNOSIS — Z03818 Encounter for observation for suspected exposure to other biological agents ruled out: Secondary | ICD-10-CM | POA: Diagnosis not present

## 2018-10-13 DIAGNOSIS — R0602 Shortness of breath: Secondary | ICD-10-CM | POA: Diagnosis not present

## 2018-10-13 NOTE — Telephone Encounter (Signed)
Acknowledged. Patients primary risk factor is age. Otherwise no other significant major co morbid conditions.  Jesse Figueroa, [facility] 10/13/2018, 1:14 PM

## 2018-10-13 NOTE — Telephone Encounter (Signed)
The pt called with concerns, because he came in contact with someone who was diagnose with COVID-19. He is currently asymptomatic, but concern because he would be consider more high risk if he develops it. I gave the patient information about testing sites and informed him that they will screen him and determine if he meets criteria. I also informed the patient to contact them first and notify them of his concerns so they can inform him of there protocol. He verbalize understanding, no questions or concerns.

## 2018-11-03 ENCOUNTER — Other Ambulatory Visit: Payer: Self-pay | Admitting: Family Medicine

## 2018-11-03 DIAGNOSIS — I10 Essential (primary) hypertension: Secondary | ICD-10-CM

## 2018-11-22 ENCOUNTER — Other Ambulatory Visit: Payer: Self-pay

## 2018-11-22 ENCOUNTER — Encounter: Payer: Self-pay | Admitting: Family Medicine

## 2018-11-22 ENCOUNTER — Ambulatory Visit (INDEPENDENT_AMBULATORY_CARE_PROVIDER_SITE_OTHER): Payer: Medicare HMO | Admitting: Family Medicine

## 2018-11-22 ENCOUNTER — Other Ambulatory Visit: Payer: Self-pay | Admitting: Family Medicine

## 2018-11-22 DIAGNOSIS — R198 Other specified symptoms and signs involving the digestive system and abdomen: Secondary | ICD-10-CM

## 2018-11-22 DIAGNOSIS — Z0000 Encounter for general adult medical examination without abnormal findings: Secondary | ICD-10-CM

## 2018-11-22 DIAGNOSIS — E7849 Other hyperlipidemia: Secondary | ICD-10-CM

## 2018-11-22 DIAGNOSIS — K30 Functional dyspepsia: Secondary | ICD-10-CM | POA: Diagnosis not present

## 2018-11-22 DIAGNOSIS — R7303 Prediabetes: Secondary | ICD-10-CM

## 2018-11-22 DIAGNOSIS — I10 Essential (primary) hypertension: Secondary | ICD-10-CM

## 2018-11-22 DIAGNOSIS — F5101 Primary insomnia: Secondary | ICD-10-CM

## 2018-11-22 DIAGNOSIS — J3089 Other allergic rhinitis: Secondary | ICD-10-CM | POA: Diagnosis not present

## 2018-11-22 DIAGNOSIS — R351 Nocturia: Secondary | ICD-10-CM

## 2018-11-22 DIAGNOSIS — N183 Chronic kidney disease, stage 3 unspecified: Secondary | ICD-10-CM

## 2018-11-22 NOTE — Progress Notes (Signed)
Virtual Visit via Telephone The purpose of this virtual visit is to provide medical care while limiting exposure to the novel coronavirus (COVID19) for both patient and office staff.  Consent was obtained for phone visit:  Yes.   Answered questions that patient had about telehealth interaction:  Yes.   I discussed the limitations, risks, security and privacy concerns of performing an evaluation and management service by telephone. I also discussed with the patient that there may be a patient responsible charge related to this service. The patient expressed understanding and agreed to proceed.  Patient Location: Home Provider Location: [facility])   ---------------------------------------------------------------------- Chief Complaint  Patient presents with  . Nasal Congestion    onset week denies cough or fever, as per patient was around someone was diagnosed positive with COVID19, but patient tested and was negative  . Abdominal Pain    pain intermitten but denies nausea or diarrhea onset week  . Tremors    more active     S: Reviewed CMA documentation. I have called patient and gathered additional HPI as follows:  ALLERGIC RHINITIS / CONGESTION Reports that symptoms started about 1 week ago with onset sinus congestion and post nasal drainage, similar to previous episode treated in 2019 with flonase. He has not restarted flonase yet. He started coricidinHBP for cold symptoms with relief of stuffiness and congestion. Denies any current fevers, chills, sweats, body ache, cough, shortness of breath, sinus pain or pressure, headache  Follow-up Abdominal Discomfort / Pain Reports recurrent episode similar to the past flare ups, discussed in 2019, he has minor stomach upset, seems episodic. He says it is more mild with just "not feeling right", previous flares resolved self limited, never took dicyclomine, or miralax as advised before. Improved after use  bathroom. Denies diarrhea, dark stool, blood in stool, nausea vomiting, unintentional weight loss  Admits reduced energy, now track season cancelled he has been less active, feels in a fog at times  -------------------------------------------------------------------------- O: No physical exam performed due to remote telephone encounter.  -------------------------------------------------------------------------- A&P:  Suspected Acute Allergic Rhinosinusitis, possible for benign allergic onset similar to before - Reassuring without high risk symptoms - Afebrile, without dyspnea - No comorbid pulmonary conditions (asthma, COPD) or immunocompromise  Additionally with functional GI symptoms, similar to previous episodes  - Currently patient is LOW RISK for COVID19 - already tested negative recently, despite exposure to a COVID19 positive case.  1. Start back on Flonase existing rx 2 spray each nostril daily for few weeks, for post nasal drainage and sinus symptoms 2. May use OTC cough/cold sinus meds if needed 3. Improve hydration 4. For GI symptoms can improve diet, avoid triggers, try OTC probiotic, fiber supplement 5. Offered dicyclomine, zofran, other symptomatic med - he declined 6. Offered GI referral in future if not improving - re discuss in future    No orders of the defined types were placed in this encounter.    If symptoms do not resolve or significantly improve OR if WORSENING - fever / cough - or worsening shortness of breath - then should contact us and seek advice on next steps in treatment at home vs where/when to seek care at Urgent Care or Hospital ED for further intervention and possible testing if indicated.  Patient verbalizes understanding with the above medical recommendations including the limitation of remote medical advice.  Specific follow-up / call-back criteria were given for patient to follow-up or seek medical care more urgently if needed.   -  Time  spent in direct consultation with patient on phone: 15 minutes   [name], [facility] 11/22/2018, 9:37 AM

## 2018-11-22 NOTE — Patient Instructions (Addendum)
  Start nasal steroid Flonase 2 sprays in each nostril daily for 4-6 weeks, may repeat course seasonally or as needed  Try to improve diet, can try fiber supplement, probiotic, if we need rx or referral for GI symptoms we can  Return for labs tomorrow 6/9 as discussed ,then physical  Please schedule a Follow-up Appointment to: Return in about 1 week (around 11/29/2018), or if symptoms worsen or fail to improve, for sinus.  If you have any other questions or concerns, please feel free to call the office or send a message through [place]. You may also schedule an earlier appointment if necessary.  Additionally, you may be receiving a survey about your experience at our office within a few days to 1 week by e-mail or mail. We value your feedback.  [name], DO [place]

## 2018-11-23 ENCOUNTER — Other Ambulatory Visit: Payer: Medicare HMO

## 2018-11-23 DIAGNOSIS — E7849 Other hyperlipidemia: Secondary | ICD-10-CM | POA: Diagnosis not present

## 2018-11-23 DIAGNOSIS — R7303 Prediabetes: Secondary | ICD-10-CM | POA: Diagnosis not present

## 2018-11-23 DIAGNOSIS — R351 Nocturia: Secondary | ICD-10-CM | POA: Diagnosis not present

## 2018-11-23 DIAGNOSIS — N183 Chronic kidney disease, stage 3 (moderate): Secondary | ICD-10-CM | POA: Diagnosis not present

## 2018-11-23 DIAGNOSIS — Z0000 Encounter for general adult medical examination without abnormal findings: Secondary | ICD-10-CM | POA: Diagnosis not present

## 2018-11-23 DIAGNOSIS — F5101 Primary insomnia: Secondary | ICD-10-CM | POA: Diagnosis not present

## 2018-11-23 DIAGNOSIS — I10 Essential (primary) hypertension: Secondary | ICD-10-CM | POA: Diagnosis not present

## 2018-11-24 ENCOUNTER — Ambulatory Visit (INDEPENDENT_AMBULATORY_CARE_PROVIDER_SITE_OTHER): Payer: Medicare HMO | Admitting: Family Medicine

## 2018-11-24 ENCOUNTER — Encounter: Payer: Self-pay | Admitting: Family Medicine

## 2018-11-24 ENCOUNTER — Other Ambulatory Visit: Payer: Self-pay

## 2018-11-24 VITALS — BP 134/78 | HR 54 | Temp 98.5°F | Resp 16 | Ht 71.0 in | Wt 174.0 lb

## 2018-11-24 DIAGNOSIS — N182 Chronic kidney disease, stage 2 (mild): Secondary | ICD-10-CM

## 2018-11-24 DIAGNOSIS — Z0000 Encounter for general adult medical examination without abnormal findings: Secondary | ICD-10-CM | POA: Diagnosis not present

## 2018-11-24 DIAGNOSIS — R7303 Prediabetes: Secondary | ICD-10-CM | POA: Diagnosis not present

## 2018-11-24 DIAGNOSIS — G250 Essential tremor: Secondary | ICD-10-CM

## 2018-11-24 DIAGNOSIS — I129 Hypertensive chronic kidney disease with stage 1 through stage 4 chronic kidney disease, or unspecified chronic kidney disease: Secondary | ICD-10-CM

## 2018-11-24 DIAGNOSIS — E7849 Other hyperlipidemia: Secondary | ICD-10-CM

## 2018-11-24 LAB — CBC WITH DIFFERENTIAL/PLATELET
Absolute Monocytes: 378 cells/uL (ref 200–950)
Basophils Absolute: 31 cells/uL (ref 0–200)
Basophils Relative: 0.8 %
Eosinophils Absolute: 250 cells/uL (ref 15–500)
Eosinophils Relative: 6.4 %
HCT: 43.2 % (ref 38.5–50.0)
Hemoglobin: 13.5 g/dL (ref 13.2–17.1)
Lymphs Abs: 1599 cells/uL (ref 850–3900)
MCH: 27.3 pg (ref 27.0–33.0)
MCHC: 31.3 g/dL — ABNORMAL LOW (ref 32.0–36.0)
MCV: 87.4 fL (ref 80.0–100.0)
MPV: 9.7 fL (ref 7.5–12.5)
Monocytes Relative: 9.7 %
Neutro Abs: 1642 cells/uL (ref 1500–7800)
Neutrophils Relative %: 42.1 %
Platelets: 334 10*3/uL (ref 140–400)
RBC: 4.94 10*6/uL (ref 4.20–5.80)
RDW: 13.1 % (ref 11.0–15.0)
Total Lymphocyte: 41 %
WBC: 3.9 10*3/uL (ref 3.8–10.8)

## 2018-11-24 LAB — COMPLETE METABOLIC PANEL WITH GFR
AG Ratio: 1.5 (calc) (ref 1.0–2.5)
ALT: 21 U/L (ref 9–46)
AST: 19 U/L (ref 10–35)
Albumin: 4.1 g/dL (ref 3.6–5.1)
Alkaline phosphatase (APISO): 104 U/L (ref 35–144)
BUN/Creatinine Ratio: 12 (calc) (ref 6–22)
BUN: 15 mg/dL (ref 7–25)
CO2: 26 mmol/L (ref 20–32)
Calcium: 9.2 mg/dL (ref 8.6–10.3)
Chloride: 105 mmol/L (ref 98–110)
Creat: 1.22 mg/dL — ABNORMAL HIGH (ref 0.70–1.18)
GFR, Est African American: 69 mL/min/{1.73_m2} (ref >=60)
GFR, Est Non African American: 60 mL/min/{1.73_m2} (ref >=60)
Globulin: 2.8 g/dL (calc) (ref 1.9–3.7)
Glucose, Bld: 100 mg/dL — ABNORMAL HIGH (ref 65–99)
Potassium: 4.4 mmol/L (ref 3.5–5.3)
Sodium: 139 mmol/L (ref 135–146)
Total Bilirubin: 0.5 mg/dL (ref 0.2–1.2)
Total Protein: 6.9 g/dL (ref 6.1–8.1)

## 2018-11-24 LAB — HEMOGLOBIN A1C
Hgb A1c MFr Bld: 6.4 % of total Hgb — ABNORMAL HIGH (ref <5.7)
Mean Plasma Glucose: 137 (calc)
eAG (mmol/L): 7.6 (calc)

## 2018-11-24 LAB — LIPID PANEL
Cholesterol: 195 mg/dL (ref <200)
HDL: 46 mg/dL (ref >=40)
LDL Cholesterol (Calc): 130 mg/dL (calc) — ABNORMAL HIGH
Non-HDL Cholesterol (Calc): 149 mg/dL (calc) — ABNORMAL HIGH (ref <130)
Total CHOL/HDL Ratio: 4.2 (calc) (ref <5.0)
Triglycerides: 86 mg/dL (ref <150)

## 2018-11-24 LAB — PSA: PSA: 1.2 ng/mL (ref <=4.0)

## 2018-11-24 NOTE — Patient Instructions (Addendum)
Thank you for coming to the office today.  Nature Made Vitamin - recommend multivitamin complete daily, prefer no or little iron, if not helping energy can add a B Complex Vitamin, and Vitamin D  For the tremors - Most likely Essential Tremor, which is benign, often happens when you are using the arm or active, like an intention tremor - the propranolol should help control it but if it is no longer helping, we can consider a different med in the future Primidone, or we can refer to Neurologist for 2nd opinion, let me know  Please schedule a Follow-up Appointment to: Return in about 3 months (around 02/24/2019) for 3 month PreDM A1c, energy.  If you have any other questions or concerns, please feel free to call the office or send a message through [place]. You may also schedule an earlier appointment if necessary.  Additionally, you may be receiving a survey about your experience at our office within a few days to 1 week by e-mail or mail. We value your feedback.  [name], DO [place]

## 2018-11-24 NOTE — Progress Notes (Signed)
Subjective:    [name], male    DOB: Jun 07, 1951, 68 y.o.   MRN: [id number]  Jesse Figueroa is a 68 y.o. male presenting on 11/24/2018 for Annual Exam (btw as per pt was in contact with someone friend who had positive covid test but he was tested 3 weeks ago negative result for him)   HPI   Here for Annual Physical and Lab Review  Elevated A1c /Pre-Diabetes Recent trend A1c elevated 6.3 to 6.5 in recent history CBGs:Not check CBG at home Meds:None (never on) Currently on ARB - Family history of DM, Mother Denies hypoglycemia, polyuria, polydipsia  CHRONIC HTN: Checking BP at home, similar readings previously 130s/80s Current Meds -Losartan100mg  daily, Propranolol 20mg  BID Reports good compliance, took meds today. Tolerating well, w/o complaints. Denies CP, dyspnea, HA, edema, dizziness / lightheadedness  CKD-II Prior visit had elevated Creatinine to 1.3. Lab shows slightly improved Cr. Overall stable.  Follow-up Abdominal Discomfort / Constipation - Last visit with me 02/04/18, for initial visit for same problem, treated with reassurance likely functional GI symptom and given advice for OTC Miralax trial - deferred dicyclomine or other testing, see prior notes for background information. - Interval update with problem has RESOLVED spontaneously he purchased miralax but never started it - Today no new concerns Denies abdominal pain, constipation, diarrhea, dark stool  HYPERLIPIDEMIA: Last lipid panel 11/2018, mild elevated LDL - Currently taking Atorvastatin 10mg , tolerating well without side effects or myalgias Lifestyle - Diet: limited - Exercise: limited activity since March, no coaching, less active  Additional history  Essential Tremor Isolated to Right upper extremity, describes only provoked intention tremor  Health Maintenance: Last colonoscopy 2017, next due in 5 years from that date, approx 2022  Depression screen [facility] 2/9 11/24/2018  11/22/2018 03/23/2018  Decreased Interest 0 0 0  Down, Depressed, Hopeless 0 0 0  PHQ - 2 Score 0 0 0    Past Medical History:  Diagnosis Date  . Hyperlipidemia   . Tremor    Past Surgical History:  Procedure Laterality Date  . COLONOSCOPY WITH PROPOFOL N/A 02/05/2016   Procedure: COLONOSCOPY WITH PROPOFOL;  Surgeon: Lollie Sails, MD;  Location: [facility] ENDOSCOPY;  Service: Endoscopy;  Laterality: N/A;   Social History   Socioeconomic History  . Marital status: Married    Spouse name: Not on file  . Number of children: Not on file  . Years of education: Not on file  . Highest education level: Not on file  Occupational History  . Occupation: Psychologist, clinical    Comment: Pension scheme manager  Social Needs  . Financial resource strain: Not hard at all  . Food insecurity:    Worry: Never true    Inability: Never true  . Transportation needs:    Medical: No    Non-medical: No  Tobacco Use  . Smoking status: Never Smoker  . Smokeless tobacco: Never Used  Substance and Sexual Activity  . Alcohol use: No  . Drug use: No  . Sexual activity: Not on file  Lifestyle  . Physical activity:    Days per week: 7 days    Minutes per session: 30 min  . Stress: Not at all  Relationships  . Social connections:    Talks on phone: More than three times a week    Gets together: More than three times a week    Attends religious service: Never    Active member of club or organization: Yes  Attends meetings of clubs or organizations: More than 4 times per year    Relationship status: Married  . Intimate partner violence:    Fear of current or ex partner: No    Emotionally abused: No    Physically abused: No    Forced sexual activity: No  Other Topics Concern  . Not on file  Social History Narrative   Track coach 5 days a week    Family History  Problem Relation Age of Onset  . Heart disease Mother   . Hypertension Mother   . Diabetes Mother   . Cancer Father     Current Outpatient Medications on File Prior to Visit  Medication Sig  . aspirin 81 MG chewable tablet Chew by mouth.  Marland Kitchen atorvastatin (LIPITOR) 10 MG tablet TAKE 1 TABLET EVERY DAY  AT  6PM  . fluticasone (FLONASE) 50 MCG/ACT nasal spray Place 2 sprays into both nostrils daily. Use for 4-6 weeks then stop and use seasonally or as needed.  Marland Kitchen losartan (COZAAR) 100 MG tablet TAKE 1 TABLET EVERY DAY  . polyethylene glycol powder (GLYCOLAX/MIRALAX) powder Take 17-34 g by mouth daily. OTC  . propranolol (INDERAL) 20 MG tablet TAKE 1 TABLET TWICE DAILY  . FLUAD 0.5 ML SUSY TO BE ADMINISTERED BY PHARMACIST FOR IMMUNIZATION   No current facility-administered medications on file prior to visit.     Review of Systems  Constitutional: Positive for fatigue. Negative for activity change, appetite change, chills, diaphoresis and fever.  HENT: Negative for congestion and hearing loss.   Eyes: Negative for visual disturbance.  Respiratory: Negative for cough, chest tightness, shortness of breath and wheezing.   Cardiovascular: Negative for chest pain, palpitations and leg swelling.  Gastrointestinal: Negative for abdominal pain, constipation, diarrhea, nausea and vomiting.  Endocrine: Negative for cold intolerance.  Genitourinary: Negative for dysuria, frequency and hematuria.  Musculoskeletal: Negative for arthralgias and neck pain.  Skin: Negative for rash.  Allergic/Immunologic: Negative for environmental allergies.  Neurological: Negative for dizziness, weakness, light-headedness, numbness and headaches.  Hematological: Negative for adenopathy.  Psychiatric/Behavioral: Negative for behavioral problems, dysphoric mood and sleep disturbance.   Per HPI unless specifically indicated above     Objective:    BP 134/78   Pulse (!) 54   Temp 98.5 F (36.9 C) (Oral)   Resp 16   Ht 5\' 11"  (1.803 m)   Wt 174 lb (78.9 kg)   BMI 24.27 kg/m   Wt Readings from Last 3 Encounters:  11/24/18 174 lb  (78.9 kg)  03/23/18 173 lb (78.5 kg)  02/04/18 170 lb (77.1 kg)    Physical Exam Vitals signs and nursing note reviewed.  Constitutional:      General: He is not in acute distress.    Appearance: He is well-developed. He is not diaphoretic.     Comments: Well-appearing, comfortable, cooperative  HENT:     Head: Normocephalic and atraumatic.  Eyes:     General:        Right eye: No discharge.        Left eye: No discharge.     Conjunctiva/sclera: Conjunctivae normal.     Pupils: Pupils are equal, round, and reactive to light.  Neck:     Musculoskeletal: Normal range of motion and neck supple.     Thyroid: No thyromegaly.     Comments: No carotid bruits Cardiovascular:     Rate and Rhythm: Normal rate and regular rhythm.     Heart sounds: Normal heart sounds. No  murmur.  Pulmonary:     Effort: Pulmonary effort is normal. No respiratory distress.     Breath sounds: Normal breath sounds. No wheezing or rales.  Abdominal:     General: Bowel sounds are normal. There is no distension.     Palpations: Abdomen is soft. There is no mass.     Tenderness: There is no abdominal tenderness.  Musculoskeletal: Normal range of motion.        General: No tenderness.     Comments: Upper / Lower Extremities: - Normal muscle tone, strength bilateral upper extremities 5/5, lower extremities 5/5  Lymphadenopathy:     Cervical: No cervical adenopathy.  Skin:    General: Skin is warm and dry.     Findings: No erythema or rash.  Neurological:     Mental Status: He is alert and oriented to person, place, and time.     Comments: Distal sensation intact to light touch all extremities  Psychiatric:        Behavior: Behavior normal.     Comments: Well groomed, good eye contact, normal speech and thoughts        Results for orders placed or performed in visit on 11/22/18  Lipid panel  Result Value Ref Range   Cholesterol 195 <200 mg/dL   HDL 46 > OR = 40 mg/dL   Triglycerides 86 <150 mg/dL    LDL Cholesterol (Calc) 130 (H) mg/dL (calc)   Total CHOL/HDL Ratio 4.2 <5.0 (calc)   Non-HDL Cholesterol (Calc) 149 (H) <130 mg/dL (calc)  COMPLETE METABOLIC PANEL WITH GFR  Result Value Ref Range   Glucose, Bld 100 (H) 65 - 99 mg/dL   BUN 15 7 - 25 mg/dL   Creat 1.22 (H) 0.70 - 1.18 mg/dL   GFR, Est Non African American 60 > OR = 60 mL/min/1.25m2   GFR, Est African American 69 > OR = 60 mL/min/1.22m2   BUN/Creatinine Ratio 12 6 - 22 (calc)   Sodium 139 135 - 146 mmol/L   Potassium 4.4 3.5 - 5.3 mmol/L   Chloride 105 98 - 110 mmol/L   CO2 26 20 - 32 mmol/L   Calcium 9.2 8.6 - 10.3 mg/dL   Total Protein 6.9 6.1 - 8.1 g/dL   Albumin 4.1 3.6 - 5.1 g/dL   Globulin 2.8 1.9 - 3.7 g/dL (calc)   AG Ratio 1.5 1.0 - 2.5 (calc)   Total Bilirubin 0.5 0.2 - 1.2 mg/dL   Alkaline phosphatase (APISO) 104 35 - 144 U/L   AST 19 10 - 35 U/L   ALT 21 9 - 46 U/L  Hemoglobin A1c  Result Value Ref Range   Hgb A1c MFr Bld 6.4 (H) <5.7 % of total Hgb   Mean Plasma Glucose 137 (calc)   eAG (mmol/L) 7.6 (calc)  CBC with Differential/Platelet  Result Value Ref Range   WBC 3.9 3.8 - 10.8 Thousand/uL   RBC 4.94 4.20 - 5.80 Million/uL   Hemoglobin 13.5 13.2 - 17.1 g/dL   HCT 43.2 38.5 - 50.0 %   MCV 87.4 80.0 - 100.0 fL   MCH 27.3 27.0 - 33.0 pg   MCHC 31.3 (L) 32.0 - 36.0 g/dL   RDW 13.1 11.0 - 15.0 %   Platelets 334 140 - 400 Thousand/uL   MPV 9.7 7.5 - 12.5 fL   Neutro Abs 1,642 1,500 - 7,800 cells/uL   Lymphs Abs 1,599 850 - 3,900 cells/uL   Absolute Monocytes 378 200 - 950 cells/uL   Eosinophils Absolute  250 15 - 500 cells/uL   Basophils Absolute 31 0 - 200 cells/uL   Neutrophils Relative % 42.1 %   Total Lymphocyte 41.0 %   Monocytes Relative 9.7 %   Eosinophils Relative 6.4 %   Basophils Relative 0.8 %  PSA  Result Value Ref Range   PSA 1.2 < OR = 4.0 ng/mL      Assessment & Plan:   Problem List Items Addressed This Visit    Benign hypertension with CKD (chronic kidney disease),  stage II    Controlled HTN - Home BP readings appropriate  Complication with CKD-II    Plan:  1. Continue current BP regimen - Losartan 100mg  daily, Propranolol 20mg  BID 2. Encourage improved lifestyle - low sodium diet, regular exercise 3. Continue monitor BP outside office, bring readings to next visit, if persistently >140/90 or new symptoms notify office sooner      CKD (chronic kidney disease), stage II    Secondary to HTN and Elevated A1c and age most likely Recent Cr trend had been relatively stable some fluctuation Cr 1.15 to 1.30  Remain off NSAID Continue ARB Follow-up Cr trend      Essential tremor    Persistent on Propranolol for essential tremor and HTN  Plan Continue propranolol Future consider add primidone if worse, or consult neuro      Hyperlipidemia    Controlled cholesterol on statin and lifestyle Last lipid panel 11/2018  Plan: 1. Continue current meds - Atorvastatin 10mg  daily 2. Future consider ASA 81mg  for primary ASCVD risk reduction 3. Encourage improved lifestyle - low carb/cholesterol, reduce portion size, continue improving regular exercise      Pre-diabetes    A1c 6.4, persistent elevated Prior Range 6.3 to 6.5 at risk of progression to DM Concern with HTN. Fam history of DM  Plan:  1. Not on any therapy currently 2. Encourage improved lifestyle - glycemic index handout given, low carb, low sugar diet, reduce portion size, continue improving regular exercise 3. Follow-up 3 months       Other Visit Diagnoses    Annual physical exam    -  Primary      Updated Health Maintenance information Reviewed recent lab results with patient Encouraged improvement to lifestyle with diet and exercise   No orders of the defined types were placed in this encounter.   Follow up plan: Return in about 3 months (around 02/24/2019) for 3 month PreDM A1c, energy.  [name], [facility]  11/24/2018, 3:29 PM

## 2018-11-25 NOTE — Assessment & Plan Note (Signed)
Controlled HTN - Home BP readings appropriate  Complication with CKD-II    Plan:  1. Continue current BP regimen - Losartan 100mg  daily, Propranolol 20mg  BID 2. Encourage improved lifestyle - low sodium diet, regular exercise 3. Continue monitor BP outside office, bring readings to next visit, if persistently >140/90 or new symptoms notify office sooner

## 2018-11-25 NOTE — Assessment & Plan Note (Signed)
Controlled cholesterol on statin and lifestyle Last lipid panel 11/2018  Plan: 1. Continue current meds - Atorvastatin 10mg  daily 2. Future consider ASA 81mg  for primary ASCVD risk reduction 3. Encourage improved lifestyle - low carb/cholesterol, reduce portion size, continue improving regular exercise

## 2018-11-25 NOTE — Assessment & Plan Note (Signed)
A1c 6.4, persistent elevated Prior Range 6.3 to 6.5 at risk of progression to DM Concern with HTN. Fam history of DM  Plan:  1. Not on any therapy currently 2. Encourage improved lifestyle - glycemic index handout given, low carb, low sugar diet, reduce portion size, continue improving regular exercise 3. Follow-up 3 months

## 2018-11-25 NOTE — Assessment & Plan Note (Addendum)
Persistent on Propranolol for essential tremor and HTN  Plan Continue propranolol Future consider add primidone if worse, or consult neuro

## 2018-11-25 NOTE — Assessment & Plan Note (Signed)
Secondary to HTN and Elevated A1c and age most likely Recent Cr trend had been relatively stable some fluctuation Cr 1.15 to 1.30  Remain off NSAID Continue ARB Follow-up Cr trend

## 2018-12-13 ENCOUNTER — Encounter: Payer: Self-pay | Admitting: Family Medicine

## 2018-12-13 ENCOUNTER — Other Ambulatory Visit: Payer: Self-pay

## 2018-12-13 ENCOUNTER — Other Ambulatory Visit: Payer: Self-pay | Admitting: Family Medicine

## 2018-12-13 ENCOUNTER — Ambulatory Visit (INDEPENDENT_AMBULATORY_CARE_PROVIDER_SITE_OTHER): Payer: Medicare HMO | Admitting: Family Medicine

## 2018-12-13 DIAGNOSIS — K30 Functional dyspepsia: Secondary | ICD-10-CM | POA: Diagnosis not present

## 2018-12-13 DIAGNOSIS — K5900 Constipation, unspecified: Secondary | ICD-10-CM | POA: Diagnosis not present

## 2018-12-13 DIAGNOSIS — R110 Nausea: Secondary | ICD-10-CM

## 2018-12-13 DIAGNOSIS — E7849 Other hyperlipidemia: Secondary | ICD-10-CM

## 2018-12-13 DIAGNOSIS — R198 Other specified symptoms and signs involving the digestive system and abdomen: Secondary | ICD-10-CM

## 2018-12-13 DIAGNOSIS — J3089 Other allergic rhinitis: Secondary | ICD-10-CM

## 2018-12-13 MED ORDER — FLUTICASONE PROPIONATE 50 MCG/ACT NA SUSP: 2.0000 | Freq: Every day | NASAL | 1 refills | Status: DC

## 2018-12-13 MED ORDER — ATORVASTATIN CALCIUM 10 MG PO TABS: 10.0000 mg | ORAL_TABLET | Freq: Every day | ORAL | 3 refills | Status: DC

## 2018-12-13 NOTE — Patient Instructions (Addendum)
Stay tuned for apt info from GI doctor  [facility]) [address], [place] [address] Phone: [phone]  If you have worsening symptoms sooner, notify us we can offer zofran for nausea or dicyclomine as needed for cramping  Please schedule a Follow-up Appointment to: Return in about 2 weeks (around 12/27/2018), or if symptoms worsen or fail to improve.  If you have any other questions or concerns, please feel free to call the office or send a message through [place]. You may also schedule an earlier appointment if necessary.  Additionally, you may be receiving a survey about your experience at our office within a few days to 1 week by e-mail or mail. We value your feedback.  [name], DO [place]

## 2018-12-13 NOTE — Progress Notes (Signed)
Subjective:    [name], male    DOB: [birth year], 68 y.o.   MRN: [id number]  Jesse Figueroa is a 68 y.o. male presenting on 12/13/2018 for Anemia (nausea, abdominal pain onset 10 days intemitten)  Virtual / Telehealth Encounter - Telephone  The purpose of this virtual visit is to provide medical care while limiting exposure to the novel coronavirus (COVID19) for both patient and office staff.  Consent was obtained for remote visit:  Yes.   Answered questions that patient had about telehealth interaction:  Yes.   I discussed the limitations, risks, security and privacy concerns of performing an evaluation and management service by video/telephone. I also discussed with the patient that there may be a patient responsible charge related to this service. The patient expressed understanding and agreed to proceed.  Patient Location: Home Provider Location: [facility] (Office)   HPI    Follow-up Abdominal Discomfort / Constipation / Functional GI Symptoms - Last visit with 01/2018 and 11/2018, for same problem, treated withreassurance likely functional GI symptom and given advice for OTC Miralax trial - deferred dicyclomine or other testing, see prior notes for background information. - Interval update withproblem had temporarily resolved, then since last visit in past 2 weeks it recurred again. - Today with reported constellation of symptoms with nausea without vomiting, malaise fatigue still present without improvement. Still has good days and some bad days episodic - Recent labs done in 11/2018 for physical did not show any other cause of his symptoms at this time '- Last colonoscopy done by [facility] in 2017 had polyps Denies diarrhea dark stool, blood in stool, current abdominal pain    Depression screen [facility] 2/9 12/13/2018 11/24/2018 11/22/2018  Decreased Interest 0 0 0  Down, Depressed, Hopeless 0 0 0  PHQ - 2 Score 0 0 0    Social History   Tobacco Use   . Smoking status: Never Smoker  . Smokeless tobacco: Never Used  Substance Use Topics  . Alcohol use: No  . Drug use: No    Review of Systems Per HPI unless specifically indicated above     Objective:    There were no vitals taken for this visit.  Wt Readings from Last 3 Encounters:  11/24/18 174 lb (78.9 kg)  03/23/18 173 lb (78.5 kg)  02/04/18 170 lb (77.1 kg)    Physical Exam   Note examination was completely remotely via telephone.  Results for orders placed or performed in visit on 11/22/18  Lipid panel  Result Value Ref Range   Cholesterol 195 <200 mg/dL   HDL 46 > OR = 40 mg/dL   Triglycerides 86 <150 mg/dL   LDL Cholesterol (Calc) 130 (H) mg/dL (calc)   Total CHOL/HDL Ratio 4.2 <5.0 (calc)   Non-HDL Cholesterol (Calc) 149 (H) <130 mg/dL (calc)  COMPLETE METABOLIC PANEL WITH GFR  Result Value Ref Range   Glucose, Bld 100 (H) 65 - 99 mg/dL   BUN 15 7 - 25 mg/dL   Creat 1.22 (H) 0.70 - 1.18 mg/dL   GFR, Est Non African American 60 > OR = 60 mL/min/1.55m2   GFR, Est African American 69 > OR = 60 mL/min/1.68m2   BUN/Creatinine Ratio 12 6 - 22 (calc)   Sodium 139 135 - 146 mmol/L   Potassium 4.4 3.5 - 5.3 mmol/L   Chloride 105 98 - 110 mmol/L   CO2 26 20 - 32 mmol/L   Calcium 9.2 8.6 -  10.3 mg/dL   Total Protein 6.9 6.1 - 8.1 g/dL   Albumin 4.1 3.6 - 5.1 g/dL   Globulin 2.8 1.9 - 3.7 g/dL (calc)   AG Ratio 1.5 1.0 - 2.5 (calc)   Total Bilirubin 0.5 0.2 - 1.2 mg/dL   Alkaline phosphatase (APISO) 104 35 - 144 U/L   AST 19 10 - 35 U/L   ALT 21 9 - 46 U/L  Hemoglobin A1c  Result Value Ref Range   Hgb A1c MFr Bld 6.4 (H) <5.7 % of total Hgb   Mean Plasma Glucose 137 (calc)   eAG (mmol/L) 7.6 (calc)  CBC with Differential/Platelet  Result Value Ref Range   WBC 3.9 3.8 - 10.8 Thousand/uL   RBC 4.94 4.20 - 5.80 Million/uL   Hemoglobin 13.5 13.2 - 17.1 g/dL   HCT 43.2 38.5 - 50.0 %   MCV 87.4 80.0 - 100.0 fL   MCH 27.3 27.0 - 33.0 pg   MCHC 31.3 (L) 32.0 -  36.0 g/dL   RDW 13.1 11.0 - 15.0 %   Platelets 334 140 - 400 Thousand/uL   MPV 9.7 7.5 - 12.5 fL   Neutro Abs 1,642 1,500 - 7,800 cells/uL   Lymphs Abs 1,599 850 - 3,900 cells/uL   Absolute Monocytes 378 200 - 950 cells/uL   Eosinophils Absolute 250 15 - 500 cells/uL   Basophils Absolute 31 0 - 200 cells/uL   Neutrophils Relative % 42.1 %   Total Lymphocyte 41.0 %   Monocytes Relative 9.7 %   Eosinophils Relative 6.4 %   Basophils Relative 0.8 %  PSA  Result Value Ref Range   PSA 1.2 < OR = 4.0 ng/mL      Assessment & Plan:   Problem List Items Addressed This Visit    None    Visit Diagnoses    Functional GI symptoms    -  Primary   Relevant Orders   Ambulatory referral to Gastroenterology   Constipation, unspecified constipation type       Relevant Orders   Ambulatory referral to Gastroenterology   Nausea       Relevant Orders   Ambulatory referral to Gastroenterology      Clinically still with episodic functional GI constellation of symptoms, for >1 year, unable to associate with any trigger in particular, see previous notes for additional background. - No other significant GI red flags - Had recent labs without other cause identified - Last colonoscopy [facility] 2017, with polyps  Plan - Offered PRN medication Doxycycline and Zofran but he declines, he is requesting referral today - referral sent to [facility], he prefers to stay with [facility] rather than return to prior [facility] as advised if not improving.  No orders of the defined types were placed in this encounter.  Orders Placed This Encounter  Procedures  . Ambulatory referral to Gastroenterology    Referral Priority:   Routine    Referral Type:   Consultation    Referral Reason:   Specialty Services Required    Number of Visits Requested:   1     Follow-up: - Return as needed  Patient verbalizes understanding with the above medical recommendations including the limitation of remote medical advice.   Specific follow-up and call-back criteria were given for patient to follow-up or seek medical care more urgently if needed.  Total duration of direct patient care provided via telephone: 11 minutes  [name], [facility] 12/13/2018,  4:12 PM

## 2019-01-20 ENCOUNTER — Other Ambulatory Visit: Payer: Self-pay

## 2019-01-20 ENCOUNTER — Ambulatory Visit: Payer: Medicare HMO | Admitting: Gastroenterology

## 2019-01-20 VITALS — BP 117/75 | HR 54 | Temp 98.1°F | Ht 71.0 in | Wt 170.4 lb

## 2019-01-20 DIAGNOSIS — R109 Unspecified abdominal pain: Secondary | ICD-10-CM | POA: Diagnosis not present

## 2019-01-20 NOTE — Progress Notes (Signed)
Jonathon Bellows MD, MRCP(U.K) [address]  [place], [place] [address]  Main: [phone]  Fax: [phone]   Gastroenterology Consultation  Referring Provider:     [name] * Primary Care Physician:  Olin Hauser, DO Primary Gastroenterologist:  Dr. Jonathon Bellows  Reason for Consultation:     Functional GI disorder        HPI:   Jesse Figueroa is a 68 y.o. y/o male referred for consultation & management  by Dr. Parks Ranger, Devonne Doughty, DO.    He has been referred for functional GI symptoms.  Ongoing for over an year.  Last colonoscopy in 2017.  3 benign polyps were seen in addition to sigmoid diverticulosis.  Recent labs in 12/04/2018 demonstrated a hemoglobin of 13.5 g.  Normal LFTs.  He states that for probably under a year he has had some lower abdominal discomfort which he describes a form of a spasm lasting a few seconds to occasionally a few minutes not any worse with eating but relieved after bowel movement.  He has up to 4 formed bowel movements a day.  Denies any constipation.  Denies any weight loss.  The pain is nonradiating and not severe.  Denies any NSAID use.  He also complains of some congestion in his nose.  Some heartburn as well.  Past Medical History:  Diagnosis Date  . Hyperlipidemia   . Tremor     Past Surgical History:  Procedure Laterality Date  . COLONOSCOPY WITH PROPOFOL N/A 02/05/2016   Procedure: COLONOSCOPY WITH PROPOFOL;  Surgeon: Lollie Sails, MD;  Location: [facility] ENDOSCOPY;  Service: Endoscopy;  Laterality: N/A;    Prior to Admission medications   Medication Sig Start Date End Date Taking? Authorizing Provider  aspirin 81 MG chewable tablet Chew by mouth.    [provider]  atorvastatin (LIPITOR) 10 MG tablet Take 1 tablet (10 mg total) by mouth daily at 6 PM. 12/13/18   Karamalegos, Alexander J, DO  FLUAD 0.5 ML SUSY TO BE ADMINISTERED BY PHARMACIST FOR IMMUNIZATION 02/04/18   [provider]  fluticasone (FLONASE) 50 MCG/ACT nasal spray Place 2 sprays into both nostrils daily. Use for 4-6 weeks then stop and use seasonally or as needed. 12/13/18   Karamalegos, Devonne Doughty, DO  losartan (COZAAR) 100 MG tablet TAKE 1 TABLET EVERY DAY 11/04/18   Karamalegos, Devonne Doughty, DO  polyethylene glycol powder (GLYCOLAX/MIRALAX) powder Take 17-34 g by mouth daily. OTC    [provider]  propranolol (INDERAL) 20 MG tablet TAKE 1 TABLET TWICE DAILY 07/19/18   Olin Hauser, DO    Family History  Problem Relation Age of Onset  . Heart disease Mother   . Hypertension Mother   . Diabetes Mother   . Cancer Father      Social History   Tobacco Use  . Smoking status: Never Smoker  . Smokeless tobacco: Never Used  Substance Use Topics  . Alcohol use: No  . Drug use: No    Allergies as of 01/20/2019  . (No Known Allergies)    Review of Systems:    All systems reviewed and negative except where noted in HPI.   Physical Exam:  There were no vitals taken for this visit. No LMP for male patient. Psych:  Alert and cooperative. Normal mood and affect. General:   Alert,  Well-developed, well-nourished, pleasant and cooperative in NAD Head:  Normocephalic and atraumatic. Eyes:  Sclera clear, no icterus.  Conjunctiva pink. Ears:  Normal auditory acuity. Nose:  No deformity, discharge, or lesions. Mouth:  No deformity or lesions,oropharynx pink & moist. Neck:  Supple; no masses or thyromegaly. Lungs:  Respirations even and unlabored.  Clear throughout to auscultation.   No wheezes, crackles, or rhonchi. No acute distress. Heart:  Regular rate and rhythm; no murmurs, clicks, rubs, or gallops. Abdomen:  Normal bowel sounds.  No bruits.  Soft, non-tender and non-distended without masses, hepatosplenomegaly or hernias noted.  No guarding or rebound tenderness.    Neurologic:  Alert and oriented x3;  grossly normal neurologically. Skin:  Intact without  significant lesions or rashes. No jaundice. Lymph Nodes:  No significant cervical adenopathy. Psych:  Alert and cooperative. Normal mood and affect.  Imaging Studies: No results found.  Assessment and Plan:   Jesse Figueroa is a 68 y.o. y/o male has been referred for possible evaluation of functional GI issues.  His symptoms are nonspecific and sound more like intestinal colic/spasm.  No clear aggravating factors but relieved by bowel movement.  I agree with probably some form of irritable bowel syndrome.  Although does not fix the true definition of the same.  He does have some nasal congestion which I do not think is related to this.  He also has some heartburn which I do not think is related to this.  Plan 1.  He was not too keen on starting any medications hence I suggested we try him on a fiber supplement 3 times a day.  I have provided him with him samples from the office. 2.  Trial of PPI for 2 weeks.  I have also provided him samples of the same. 3.  Follow-up in 3 to 4 weeks and if no better or if symptoms are worse could consider CT scan of the abdomen.  No red flag signs at this point of time.  Follow up in 4 weeks  Dr Jonathon Bellows MD,MRCP(U.K)

## 2019-01-25 ENCOUNTER — Ambulatory Visit: Payer: Self-pay

## 2019-02-08 ENCOUNTER — Other Ambulatory Visit: Payer: Self-pay

## 2019-02-08 ENCOUNTER — Ambulatory Visit (INDEPENDENT_AMBULATORY_CARE_PROVIDER_SITE_OTHER): Payer: Medicare HMO

## 2019-02-08 ENCOUNTER — Telehealth: Payer: Self-pay | Admitting: Family Medicine

## 2019-02-08 VITALS — BP 121/74 | HR 56 | Temp 98.7°F | Resp 16 | Ht 71.0 in | Wt 171.4 lb

## 2019-02-08 DIAGNOSIS — Z23 Encounter for immunization: Secondary | ICD-10-CM

## 2019-02-08 DIAGNOSIS — Z0000 Encounter for general adult medical examination without abnormal findings: Secondary | ICD-10-CM

## 2019-02-08 NOTE — Patient Instructions (Signed)
[name] , Thank you for taking time to come for your Medicare Wellness Visit. I appreciate your ongoing commitment to your health goals. Please review the following plan we discussed and let me know if I can assist you in the future.   Screening recommendations/referrals: Colonoscopy: completed 02/05/2016 Recommended yearly ophthalmology/optometry visit for glaucoma screening and checkup Recommended yearly dental visit for hygiene and checkup  Vaccinations: Influenza vaccine: done today  Pneumococcal vaccine: up to date Tdap vaccine: due check with your insurance for coverage Shingles vaccine: shingrix eligible, check with your insurance for coverage     Advanced directives: Please bring a copy of your health care power of attorney and living will to the office at your convenience.  Conditions/risks identified: none   Next appointment: Follow up in one year for your annual wellness visit.   Preventive Care 8 Years and Older, Male Preventive care refers to lifestyle choices and visits with your health care provider that can promote health and wellness. What does preventive care include?  A yearly physical exam. This is also called an annual well check.  Dental exams once or twice a year.  Routine eye exams. Ask your health care provider how often you should have your eyes checked.  Personal lifestyle choices, including:  Daily care of your teeth and gums.  Regular physical activity.  Eating a healthy diet.  Avoiding tobacco and drug use.  Limiting alcohol use.  Practicing safe sex.  Taking low doses of aspirin every day.  Taking vitamin and mineral supplements as recommended by your health care provider. What happens during an annual well check? The services and screenings done by your health care provider during your annual well check will depend on your age, overall health, lifestyle risk factors, and family history of disease. Counseling  Your health care  provider may ask you questions about your:  Alcohol use.  Tobacco use.  Drug use.  Emotional well-being.  Home and relationship well-being.  Sexual activity.  Eating habits.  History of falls.  Memory and ability to understand (cognition).  Work and work Statistician. Screening  You may have the following tests or measurements:  Height, weight, and BMI.  Blood pressure.  Lipid and cholesterol levels. These may be checked every 5 years, or more frequently if you are over 61 years old.  Skin check.  Lung cancer screening. You may have this screening every year starting at age 23 if you have a 30-pack-year history of smoking and currently smoke or have quit within the past 15 years.  Fecal occult blood test (FOBT) of the stool. You may have this test every year starting at age 34.  Flexible sigmoidoscopy or colonoscopy. You may have a sigmoidoscopy every 5 years or a colonoscopy every 10 years starting at age 95.  Prostate cancer screening. Recommendations will vary depending on your family history and other risks.  Hepatitis C blood test.  Hepatitis B blood test.  Sexually transmitted disease (STD) testing.  Diabetes screening. This is done by checking your blood sugar (glucose) after you have not eaten for a while (fasting). You may have this done every 1-3 years.  Abdominal aortic aneurysm (AAA) screening. You may need this if you are a current or former smoker.  Osteoporosis. You may be screened starting at age 94 if you are at high risk. Talk with your health care provider about your test results, treatment options, and if necessary, the need for more tests. Vaccines  Your health care provider may  recommend certain vaccines, such as:  Influenza vaccine. This is recommended every year.  Tetanus, diphtheria, and acellular pertussis (Tdap, Td) vaccine. You may need a Td booster every 10 years.  Zoster vaccine. You may need this after age 33.  Pneumococcal  13-valent conjugate (PCV13) vaccine. One dose is recommended after age 33.  Pneumococcal polysaccharide (PPSV23) vaccine. One dose is recommended after age 72. Talk to your health care provider about which screenings and vaccines you need and how often you need them. This information is not intended to replace advice given to you by your health care provider. Make sure you discuss any questions you have with your health care provider. Document Released: 06/29/2015 Document Revised: 02/20/2016 Document Reviewed: 04/03/2015 Elsevier Interactive Patient Education  2017 [place] Prevention in the Home Falls can cause injuries. They can happen to people of all ages. There are many things you can do to make your home safe and to help prevent falls. What can I do on the outside of my home?  Regularly fix the edges of walkways and driveways and fix any cracks.  Remove anything that might make you trip as you walk through a door, such as a raised step or threshold.  Trim any bushes or trees on the path to your home.  Use bright outdoor lighting.  Clear any walking paths of anything that might make someone trip, such as rocks or tools.  Regularly check to see if handrails are loose or broken. Make sure that both sides of any steps have handrails.  Any raised decks and porches should have guardrails on the edges.  Have any leaves, snow, or ice cleared regularly.  Use sand or salt on walking paths during winter.  Clean up any spills in your garage right away. This includes oil or grease spills. What can I do in the bathroom?  Use night lights.  Install grab bars by the toilet and in the tub and shower. Do not use towel bars as grab bars.  Use non-skid mats or decals in the tub or shower.  If you need to sit down in the shower, use a plastic, non-slip stool.  Keep the floor dry. Clean up any water that spills on the floor as soon as it happens.  Remove soap buildup in the  tub or shower regularly.  Attach bath mats securely with double-sided non-slip rug tape.  Do not have throw rugs and other things on the floor that can make you trip. What can I do in the bedroom?  Use night lights.  Make sure that you have a light by your bed that is easy to reach.  Do not use any sheets or blankets that are too big for your bed. They should not hang down onto the floor.  Have a firm chair that has side arms. You can use this for support while you get dressed.  Do not have throw rugs and other things on the floor that can make you trip. What can I do in the kitchen?  Clean up any spills right away.  Avoid walking on wet floors.  Keep items that you use a lot in easy-to-reach places.  If you need to reach something above you, use a strong step stool that has a grab bar.  Keep electrical cords out of the way.  Do not use floor polish or wax that makes floors slippery. If you must use wax, use non-skid floor wax.  Do not have throw rugs and  other things on the floor that can make you trip. What can I do with my stairs?  Do not leave any items on the stairs.  Make sure that there are handrails on both sides of the stairs and use them. Fix handrails that are broken or loose. Make sure that handrails are as long as the stairways.  Check any carpeting to make sure that it is firmly attached to the stairs. Fix any carpet that is loose or worn.  Avoid having throw rugs at the top or bottom of the stairs. If you do have throw rugs, attach them to the floor with carpet tape.  Make sure that you have a light switch at the top of the stairs and the bottom of the stairs. If you do not have them, ask someone to add them for you. What else can I do to help prevent falls?  Wear shoes that:  Do not have high heels.  Have rubber bottoms.  Are comfortable and fit you well.  Are closed at the toe. Do not wear sandals.  If you use a stepladder:  Make sure that it  is fully opened. Do not climb a closed stepladder.  Make sure that both sides of the stepladder are locked into place.  Ask someone to hold it for you, if possible.  Clearly mark and make sure that you can see:  Any grab bars or handrails.  First and last steps.  Where the edge of each step is.  Use tools that help you move around (mobility aids) if they are needed. These include:  Canes.  Walkers.  Scooters.  Crutches.  Turn on the lights when you go into a dark area. Replace any light bulbs as soon as they burn out.  Set up your furniture so you have a clear path. Avoid moving your furniture around.  If any of your floors are uneven, fix them.  If there are any pets around you, be aware of where they are.  Review your medicines with your doctor. Some medicines can make you feel dizzy. This can increase your chance of falling. Ask your doctor what other things that you can do to help prevent falls. This information is not intended to replace advice given to you by your health care provider. Make sure you discuss any questions you have with your health care provider. Document Released: 03/29/2009 Document Revised: 11/08/2015 Document Reviewed: 07/07/2014 Elsevier Interactive Patient Education  2017 [employer].

## 2019-02-08 NOTE — Progress Notes (Signed)
Subjective:   Jesse Figueroa is a 68 y.o. male who presents for Medicare Annual/Subsequent preventive examination.  Review of Systems:   Cardiac Risk Factors include: advanced age (>26men, >38 women);male gender;dyslipidemia     Objective:    Vitals: BP 121/74 (BP Location: Right Arm, Patient Position: Sitting, Cuff Size: Normal)   Pulse (!) 56   Temp 98.7 F (37.1 C) (Oral)   Resp 16   Ht 5\' 11"  (1.803 m)   Wt 171 lb 6.4 oz (77.7 kg)   BMI 23.91 kg/m   Body mass index is 23.91 kg/m.  Advanced Directives 02/08/2019 09/15/2017 04/09/2015  Does Patient Have a Medical Advance Directive? Yes Yes Yes  Type of Advance Directive Living will;Healthcare Power of Attorney Living will Living will  Copy of [place] in Chart? No - copy requested - No - copy requested    Tobacco Social History   Tobacco Use  Smoking Status Never Smoker  Smokeless Tobacco Never Used     Counseling given: Not Answered   Clinical Intake:  Pre-visit preparation completed: Yes  Pain : No/denies pain     Nutritional Status: BMI of 19-24  Normal Nutritional Risks: None Diabetes: No  How often do you need to have someone help you when you read instructions, pamphlets, or other written materials from your doctor or pharmacy?: 1 - Never  Interpreter Needed?: No  Information entered by :: Pete Merten,LPN  Past Medical History:  Diagnosis Date  . Arthritis of hand   . Benign hypertension with CKD (chronic kidney disease), stage II   . CKD (chronic kidney disease), stage II   . Hyperlipidemia   . Hypertension   . Tremor    Past Surgical History:  Procedure Laterality Date  . COLONOSCOPY WITH PROPOFOL N/A 02/05/2016   Procedure: COLONOSCOPY WITH PROPOFOL;  Surgeon: Lollie Sails, MD;  Location: [facility] ENDOSCOPY;  Service: Endoscopy;  Laterality: N/A;  . EYE SURGERY  Lasic   Family History  Problem Relation Age of Onset  . Heart disease Mother   . Hypertension Mother    . Diabetes Mother   . Cancer Father    Social History   Socioeconomic History  . Marital status: Married    Spouse name: Not on file  . Number of children: Not on file  . Years of education: Not on file  . Highest education level: Not on file  Occupational History  . Occupation: Psychologist, clinical    Comment: Pension scheme manager  Social Needs  . Financial resource strain: Not hard at all  . Food insecurity    Worry: Never true    Inability: Never true  . Transportation needs    Medical: No    Non-medical: No  Tobacco Use  . Smoking status: Never Smoker  . Smokeless tobacco: Never Used  Substance and Sexual Activity  . Alcohol use: No  . Drug use: No  . Sexual activity: Yes  Lifestyle  . Physical activity    Days per week: 7 days    Minutes per session: 30 min  . Stress: Not at all  Relationships  . Social connections    Talks on phone: More than three times a week    Gets together: More than three times a week    Attends religious service: Never    Active member of club or organization: Yes    Attends meetings of clubs or organizations: More than 4 times per year  Relationship status: Married  Other Topics Concern  . Not on file  Social History Narrative   Track coach 5 days a week     Outpatient Encounter Medications as of 02/08/2019  Medication Sig  . aspirin 81 MG chewable tablet Chew by mouth.  Marland Kitchen atorvastatin (LIPITOR) 10 MG tablet Take 1 tablet (10 mg total) by mouth daily at 6 PM.  . dexlansoprazole (DEXILANT) 60 MG capsule Take 60 mg by mouth daily.  . fluticasone (FLONASE) 50 MCG/ACT nasal spray Place 2 sprays into both nostrils daily. Use for 4-6 weeks then stop and use seasonally or as needed.  Marland Kitchen losartan (COZAAR) 100 MG tablet TAKE 1 TABLET EVERY DAY  . polyethylene glycol powder (GLYCOLAX/MIRALAX) powder Take 17-34 g by mouth daily. OTC  . propranolol (INDERAL) 20 MG tablet TAKE 1 TABLET TWICE DAILY  . [DISCONTINUED] FLUAD 0.5 ML SUSY  TO BE ADMINISTERED BY PHARMACIST FOR IMMUNIZATION   No facility-administered encounter medications on file as of 02/08/2019.     Activities of Daily Living In your present state of health, do you have any difficulty performing the following activities: 02/08/2019  Hearing? N  Comment no heariing aids  Vision? N  Comment reading glasses  Difficulty concentrating or making decisions? N  Walking or climbing stairs? N  Dressing or bathing? N  Doing errands, shopping? N  Preparing Food and eating ? N  Using the Toilet? N  In the past six months, have you accidently leaked urine? N  Do you have problems with loss of bowel control? N  Managing your Medications? N  Managing your Finances? N  Housekeeping or managing your Housekeeping? N  Some recent data might be hidden    Patient Care Team: Olin Hauser, DO as PCP - General (Family Medicine) Minor, Dalbert Garnet, RN as [facility] Management   Assessment:   This is a routine wellness examination for Jesse Figueroa.  Exercise Activities and Dietary recommendations Current Exercise Habits: The patient has a physically strenuous job, but has no regular exercise apart from work.(house work), Exercise limited by: None identified  Goals    . DIET - INCREASE WATER INTAKE     Recommend drinking at least 6-8 glasses of water a day        Fall Risk: Fall Risk  02/08/2019 12/13/2018 11/24/2018 11/22/2018 03/23/2018  Falls in the past year? 0 0 0 0 No  Follow up - - Falls evaluation completed Falls evaluation completed -    FALL RISK PREVENTION PERTAINING TO THE HOME:  Any stairs in or around the home? Yes  If so, are there any without handrails? No   Home free of loose throw rugs in walkways, pet beds, electrical cords, etc? Yes  Adequate lighting in your home to reduce risk of falls? Yes   ASSISTIVE DEVICES UTILIZED TO PREVENT FALLS:  Life alert? No  Use of a cane, walker or w/c? No  Grab bars in the bathroom? No   Shower chair or bench in shower? No  Elevated toilet seat or a handicapped toilet? No   TIMED UP AND GO:  Was the test performed? Yes .  Length of time to ambulate 10 feet: 10 sec.   GAIT:  Appearance of gait: Gait steady and fast without the use of an assistive device.  Intervention(s) required? No  DME/home health order needed?  No   Depression Screen PHQ 2/9 Scores 02/08/2019 12/13/2018 11/24/2018 11/22/2018  PHQ - 2 Score 0 0 0 0  Cognitive Function     6CIT Screen 02/08/2019 09/15/2017  What Year? 0 points 0 points  What month? 0 points 0 points  What time? 0 points 0 points  Count back from 20 0 points 0 points  Months in reverse 0 points 0 points  Repeat phrase 0 points 2 points  Total Score 0 2    Immunization History  Administered Date(s) Administered  . Fluad Quad(high Dose 65+) 02/08/2019  . Influenza, High Dose Seasonal PF 03/06/2016, 06/17/2017  . Influenza,inj,quad, With Preservative 03/16/2016  . Influenza-Unspecified 03/16/2013, 04/07/2015, 02/04/2018  . Pneumococcal Conjugate-13 04/09/2015  . Pneumococcal Polysaccharide-23 05/01/2016  . Pneumococcal-Unspecified 03/16/2016  . Tdap 06/17/2007  . Zoster 06/16/2013    Qualifies for Shingles Vaccine? Yes  Zostavax completed 06/16/2013. Due for Shingrix. Education has been provided regarding the importance of this vaccine. Pt has been advised to call insurance company to determine out of pocket expense. Advised may also receive vaccine at local pharmacy or Health Dept. Verbalized acceptance and understanding.  Tdap: Although this vaccine is not a covered service during a Wellness Exam, does the patient still wish to receive this vaccine today?  No .  Education has been provided regarding the importance of this vaccine. Advised may receive this vaccine at local pharmacy or Health Dept. Aware to provide a copy of the vaccination record if obtained from local pharmacy or Health Dept. Verbalized acceptance and  understanding.  Flu Vaccine: Due for Flu vaccine. Does the patient want to receive this vaccine today? Yes   Pneumococcal Vaccine: up to date  Screening Tests Health Maintenance  Topic Date Due  . TETANUS/TDAP  06/16/2017  . INFLUENZA VACCINE  01/15/2019  . COLONOSCOPY  02/04/2021  . Hepatitis C Screening  Completed  . PNA vac Low Risk Adult  Completed   Cancer Screenings:  Colorectal Screening: Completed 02/05/2016, due 2022  Lung Cancer Screening: (Low Dose CT Chest recommended if Age 40-80 years, 30 pack-year currently smoking OR have quit w/in 15years.) does not qualify.    Additional Screening:  Hepatitis C Screening: does qualify; Completed 09/15/2017  Vision Screening: Recommended annual ophthalmology exams for early detection of glaucoma and other disorders of the eye. Is the patient up to date with their annual eye exam?  Yes  Who is the provider or what is the name of the office in which the pt attends annual eye exams? Dr.Shah   Dental Screening: Recommended annual dental exams for proper oral hygiene  Community Resource Referral:  CRR required this visit?  No        Plan:  I have personally reviewed and addressed the Medicare Annual Wellness questionnaire and have noted the following in the patient's chart:  A. Medical and social history B. Use of alcohol, tobacco or illicit drugs  C. Current medications and supplements D. Functional ability and status E.  Nutritional status F.  Physical activity G. Advance directives H. List of other physicians I.  Hospitalizations, surgeries, and ER visits in previous 12 months J.  Secor such as hearing and vision if needed, cognitive and depression L. Referrals and appointments   In addition, I have reviewed and discussed with patient certain preventive protocols, quality metrics, and best practice recommendations. A written personalized care plan for preventive services as well as general preventive  health recommendations were provided to patient.   Signed,   Bevelyn Ngo, LPN  [id number] Nurse Health Advisor   Nurse Notes: Patient had got a sample of dexilant 60mg   from Dr.Anna a couple weeks ago for some congestion and heartburn issues.  and is requesting a prescription from PCP for this to try for longer period to see if it helps. States he wasn't on it long enough to know if it helped.

## 2019-02-08 NOTE — Chronic Care Management (AMB) (Signed)
Chronic Care Management   Note  02/08/2019 Name: Jesse Figueroa MRN: [id number] DOB: [birth year]  Jesse Figueroa is a 68 y.o. year old male who is a primary care patient of Olin Hauser, DO. I reached out to Frederico Hamman by phone today in response to a referral sent by Jesse Figueroa [employer] health plan.    Jesse Figueroa was given information about Chronic Care Management services today including:  1. CCM service includes personalized support from designated clinical staff supervised by his physician, including individualized plan of care and coordination with other care providers 2. 24/7 contact phone numbers for assistance for urgent and routine care needs. 3. Service will only be billed when office clinical staff spend 20 minutes or more in a month to coordinate care. 4. Only one practitioner may furnish and bill the service in a calendar month. 5. The patient may stop CCM services at any time (effective at the end of the month) by phone call to the office staff. 6. The patient will be responsible for cost sharing (co-pay) of up to 20% of the service fee (after annual deductible is met).  Patient agreed to services and verbal consent obtained.   Follow up plan: Telephone appointment with CCM team member scheduled for: 04/11/2019  [place]  ??[email or web address]   ??[phone]

## 2019-02-08 NOTE — Telephone Encounter (Signed)
Copied nurse note from last [place] on 02/08/19.  Nurse Notes: Patient had got a sample of dexilant 60mg  from Dr.Anna a couple weeks ago for some congestion and heartburn issues.  and is requesting a prescription from PCP for this to try for longer period to see if it helps. States he wasn't on it long enough to know if it helped.  -----------------------------------------   Please notify patient that he should contact Dr Georgeann Oppenheim GI office for requesting a rx on [place].  This medication is usually difficult to get covered, more likely to get covered from GI specialist. I do not normally write this particular rx, but if they cannot help - then we can try to order it but we have often received denial on this particular med.  [name], [facility] 02/08/2019, 5:37 PM

## 2019-02-09 NOTE — Telephone Encounter (Signed)
Advised via VM detailed message that this med needs to be filled by the GI that gave samples due to insurance and cost of meds as well as the fact that we do not write this med it is a drug GI uses.

## 2019-02-09 NOTE — Telephone Encounter (Signed)
Left VM to call us back

## 2019-03-01 ENCOUNTER — Encounter: Payer: Self-pay | Admitting: Family Medicine

## 2019-03-01 ENCOUNTER — Ambulatory Visit (INDEPENDENT_AMBULATORY_CARE_PROVIDER_SITE_OTHER): Payer: Medicare HMO | Admitting: Family Medicine

## 2019-03-01 ENCOUNTER — Other Ambulatory Visit: Payer: Self-pay

## 2019-03-01 VITALS — BP 124/72 | HR 51 | Temp 98.5°F | Resp 16 | Ht 71.0 in | Wt 175.0 lb

## 2019-03-01 DIAGNOSIS — M542 Cervicalgia: Secondary | ICD-10-CM

## 2019-03-01 DIAGNOSIS — M3500 Sicca syndrome, unspecified: Secondary | ICD-10-CM | POA: Diagnosis not present

## 2019-03-01 DIAGNOSIS — S161XXA Strain of muscle, fascia and tendon at neck level, initial encounter: Secondary | ICD-10-CM

## 2019-03-01 MED ORDER — BACLOFEN 10 MG PO TABS: 5.0000 mg | ORAL_TABLET | Freq: Every evening | ORAL | 0 refills | Status: DC | PRN

## 2019-03-01 NOTE — Progress Notes (Signed)
Subjective:    [name], male    DOB: 1951/06/15, 68 y.o.   MRN: [id number]  Jesse Figueroa is a 67 y.o. male presenting on 03/01/2019 for Neck Pain (onset 2 weeks)   HPI  Right Sided Neck Pain History of prior mild temporary issue with sleeping wrong for neck pain but usually resolved, often if sleep wrong can have a neck strain. But not significant treatment in past. - Describes over past few weeks this problem has been about the same, without significant worsening. No known trigger for this most recent episode. He denies any actual injury. Worse if flared up with activity such as rotating head/neck to the Right, less if rotating to Left, also worse with flexing neck down if working in yard. - Mild temporary relief with hand vibrator machine and ice / heat , not tried any muscle rub - Not tried any OTC medications for pain yet Tylenol, NSAIDs Denies any trauma injury, fall other joint pain, bruising redness numbness tingling  Chronic Sinus Congestion / Dry mouth? 11/2018 previous visit, has been on variety of medication for OTC allergies congestion, nasal spray limited results. He primarily describes thicker phlegm vs dry mouth, not dry eyes or other issues. He does admit caffeine  Coffee x 2 week. Occasional tea. No alcohol.  Worse with juice, can worsen thicker dry saliva at times. Denies any current fevers, chills, sweats, body ache, cough, shortness of breath, sinus pain or pressure, headache   Depression screen [facility] 2/9 03/01/2019 02/08/2019 12/13/2018  Decreased Interest 0 0 0  Down, Depressed, Hopeless 0 0 0  PHQ - 2 Score 0 0 0    Social History   Tobacco Use  . Smoking status: Never Smoker  . Smokeless tobacco: Never Used  Substance Use Topics  . Alcohol use: No  . Drug use: No    Review of Systems Per HPI unless specifically indicated above     Objective:    BP 124/72   Pulse (!) 51   Temp 98.5 F (36.9 C) (Oral)   Resp 16   Ht 5\' 11"  (1.803  m)   Wt 175 lb (79.4 kg)   BMI 24.41 kg/m   Wt Readings from Last 3 Encounters:  03/01/19 175 lb (79.4 kg)  02/08/19 171 lb 6.4 oz (77.7 kg)  01/20/19 170 lb 6.4 oz (77.3 kg)    Physical Exam Vitals signs and nursing note reviewed.  Constitutional:      General: He is not in acute distress.    Appearance: He is well-developed. He is not diaphoretic.     Comments: Well-appearing, comfortable, cooperative  HENT:     Head: Normocephalic and atraumatic.  Eyes:     General:        Right eye: No discharge.        Left eye: No discharge.     Conjunctiva/sclera: Conjunctivae normal.  Neck:     Comments: Neck Inspection: symmetrical, normal Palpation: mild localized hypertonicity of left lower posterior neck muscle, in lower trapezius region. Not SCM. Not lower in shoulder ROM: full active ROM - some reproduced discomfort with R neck rotation, otherwise has intact range Special Testing: Spurling's negative for radiculopathy Strength: normal 5/5 upper ext Neurovascular: distal intact  Cardiovascular:     Rate and Rhythm: Normal rate.  Pulmonary:     Effort: Pulmonary effort is normal.  Skin:    General: Skin is warm and dry.     Findings: No erythema  or rash.  Neurological:     Mental Status: He is alert and oriented to person, place, and time.  Psychiatric:        Behavior: Behavior normal.     Comments: Well groomed, good eye contact, normal speech and thoughts    Results for orders placed or performed in visit on 11/22/18  Lipid panel  Result Value Ref Range   Cholesterol 195 <200 mg/dL   HDL 46 > OR = 40 mg/dL   Triglycerides 86 <150 mg/dL   LDL Cholesterol (Calc) 130 (H) mg/dL (calc)   Total CHOL/HDL Ratio 4.2 <5.0 (calc)   Non-HDL Cholesterol (Calc) 149 (H) <130 mg/dL (calc)  COMPLETE METABOLIC PANEL WITH GFR  Result Value Ref Range   Glucose, Bld 100 (H) 65 - 99 mg/dL   BUN 15 7 - 25 mg/dL   Creat 1.22 (H) 0.70 - 1.18 mg/dL   GFR, Est Non African American 60 >  OR = 60 mL/min/1.80m2   GFR, Est African American 69 > OR = 60 mL/min/1.73m2   BUN/Creatinine Ratio 12 6 - 22 (calc)   Sodium 139 135 - 146 mmol/L   Potassium 4.4 3.5 - 5.3 mmol/L   Chloride 105 98 - 110 mmol/L   CO2 26 20 - 32 mmol/L   Calcium 9.2 8.6 - 10.3 mg/dL   Total Protein 6.9 6.1 - 8.1 g/dL   Albumin 4.1 3.6 - 5.1 g/dL   Globulin 2.8 1.9 - 3.7 g/dL (calc)   AG Ratio 1.5 1.0 - 2.5 (calc)   Total Bilirubin 0.5 0.2 - 1.2 mg/dL   Alkaline phosphatase (APISO) 104 35 - 144 U/L   AST 19 10 - 35 U/L   ALT 21 9 - 46 U/L  Hemoglobin A1c  Result Value Ref Range   Hgb A1c MFr Bld 6.4 (H) <5.7 % of total Hgb   Mean Plasma Glucose 137 (calc)   eAG (mmol/L) 7.6 (calc)  CBC with Differential/Platelet  Result Value Ref Range   WBC 3.9 3.8 - 10.8 Thousand/uL   RBC 4.94 4.20 - 5.80 Million/uL   Hemoglobin 13.5 13.2 - 17.1 g/dL   HCT 43.2 38.5 - 50.0 %   MCV 87.4 80.0 - 100.0 fL   MCH 27.3 27.0 - 33.0 pg   MCHC 31.3 (L) 32.0 - 36.0 g/dL   RDW 13.1 11.0 - 15.0 %   Platelets 334 140 - 400 Thousand/uL   MPV 9.7 7.5 - 12.5 fL   Neutro Abs 1,642 1,500 - 7,800 cells/uL   Lymphs Abs 1,599 850 - 3,900 cells/uL   Absolute Monocytes 378 200 - 950 cells/uL   Eosinophils Absolute 250 15 - 500 cells/uL   Basophils Absolute 31 0 - 200 cells/uL   Neutrophils Relative % 42.1 %   Total Lymphocyte 41.0 %   Monocytes Relative 9.7 %   Eosinophils Relative 6.4 %   Basophils Relative 0.8 %  PSA  Result Value Ref Range   PSA 1.2 < OR = 4.0 ng/mL      Assessment & Plan:   Problem List Items Addressed This Visit    [place] ([place])  Clinically suspect some sort of dry mouth condition to cause his symptoms Already treated aggressively for allergies, sinus, and GERD - per GI on dexilant in past but he did not benefit from it so he did not pursue refill from them.  Plan - Trial conservative care for dry mouth as instructed, with fish oil, hydration, lozenge/hard candy or gum at times PRN  for increase  saliva - May consider future consultation endocrine vs rheumatology for further eval if worsening or other complciations    Other Visit Diagnoses    Neck strain, initial encounter    -  Primary   Relevant Medications   baclofen (LIORESAL) 10 MG tablet   Neck pain on right side          Persistent L lower cervical paraspinal muscle spasm without known injury, also trapezius distrubution. No worsening or significant improvement. Without complications. No evidence of radicular symptoms or neurological deficits or weakness. - Has intact ROM No imaging including C spine, no prior dx arthritis - Inadequate conservative treatments at home  Plan - Start Baclofen 5-10mg  QHS PRN muscle relaxant, caution sedation - Add Tylenol 500-1000mg  TID PRN  - Consider NSAID PRN if need - Heating pad / ice, consider muscle rub - ROM stretching - Follow up if not improving  Meds ordered this encounter  Medications  . baclofen (LIORESAL) 10 MG tablet    Sig: Take 0.5-1 tablets (5-10 mg total) by mouth at bedtime as needed for muscle spasms.    Dispense:  30 each    Refill:  0    Follow up plan: Return in about 4 weeks (around 03/29/2019), or if symptoms worsen or fail to improve, for neck pain / dry mouth.   [name], [facility] 03/01/2019, 4:17 PM

## 2019-03-01 NOTE — Patient Instructions (Addendum)
Thank you for coming to the office today.  Likely muscle strain in neck  1. Start baclofen 10mg  (muscle relaxant) - use half or whole tablet - nightly preferred or max up to to 3 times daily (may make you drowsy, caution sedation)  2. If need any additional medicine for pain can take any of the following OTC Aleve 1-2 pills twice daily (with food) for 5 to 7 days or may do Ibuprofen / Advil (400mg  every 6 to 8 hours) with food for 5 to 7 days  Recommend to start taking Tylenol Extra Strength 500mg  tabs - take 1 to 2 tabs per dose (max 1000mg ) every 6-8 hours for pain, max 24 hour daily dose is 6 tablets or 3000mg . In the future you can repeat the same everyday Tylenol course for 1-2 weeks at a time.   4. Use moist heat or heating pad on shoulders / neck, and have family member help with soft tissue massage as demonstrated  --------------------------------------  Consider a neck x-ray - if you would like more information for possible arthritis. Otherwise not required - unless it does not resolve within 4-6 weeks.  Likely a dry mouth syndrome - you could try to use fish oil OTC 1-2 times a day to help lubricate, can take this supplement as needed.  Otherwise you can use gum or other lozenge / hard candy to help stimulate saliva.  Less likely acid reflux. Can return to GI to discuss again if need. If not improve on Dexilant probably not acid reflux.    Please schedule a Follow-up Appointment to: Return in about 4 weeks (around 03/29/2019), or if symptoms worsen or fail to improve, for neck pain / dry mouth.  If you have any other questions or concerns, please feel free to call the office or send a message through [facility]. You may also schedule an earlier appointment if necessary.  Additionally, you may be receiving a survey about your experience at our office within a few days to 1 week by e-mail or mail. We value your feedback.  [name], DO [facility]

## 2019-03-10 ENCOUNTER — Other Ambulatory Visit: Payer: Self-pay

## 2019-03-10 ENCOUNTER — Ambulatory Visit (INDEPENDENT_AMBULATORY_CARE_PROVIDER_SITE_OTHER): Payer: Medicare HMO | Admitting: Gastroenterology

## 2019-03-10 VITALS — BP 138/80 | HR 54 | Temp 98.4°F | Ht 71.0 in | Wt 174.8 lb

## 2019-03-10 DIAGNOSIS — R109 Unspecified abdominal pain: Secondary | ICD-10-CM | POA: Diagnosis not present

## 2019-03-10 MED ORDER — DICYCLOMINE HCL 10 MG PO CAPS: 10.0000 mg | ORAL_CAPSULE | Freq: Three times a day (TID) | ORAL | 2 refills | Status: DC

## 2019-03-10 NOTE — Progress Notes (Signed)
Jonathon Bellows MD, MRCP(U.K) [address]  [place]  [place], [place] [address]  Main: [phone]  Fax: [phone]   Primary Care Physician: Olin Hauser, DO  Primary Gastroenterologist:  Dr. Jonathon Bellows   Here to follow-up for abdominal discomfort.  HPI: Jesse Figueroa is a 68 y.o. male    Summary of history : Was initially referred and seen on 01/20/2019 for functional GI symptoms.Ongoing for over an year.  Last colonoscopy in 2017.  3 benign polyps were seen in addition to sigmoid diverticulosis.  Recent labs in 12/04/2018 demonstrated a hemoglobin of 13.5 g.  Normal LFTs.  He states that for probably under a year he has had some lower abdominal discomfort which he describes a form of a spasm lasting a few seconds to occasionally a few minutes not any worse with eating but relieved after bowel movement.  He has up to 4 formed bowel movements a day.  Denies any constipation.  Denies any weight loss.  The pain is nonradiating and not severe.  Denies any NSAID use.  He also complains of some congestion in his nose.  Some heartburn as well.   Interval history 01/20/2019-03/10/2019  He states that he took the [place] but it did not help much.  He says that he did not collect the fiber pills.  Not on any Bentyl.  I did discuss with him about obtaining a CAT scan of the abdomen but is not too keen at this point of time.  Still has nonspecific abdominal discomfort in the form of cramps over his abdomen.    Current Outpatient Medications  Medication Sig Dispense Refill  . aspirin 81 MG chewable tablet Chew by mouth.    Marland Kitchen atorvastatin (LIPITOR) 10 MG tablet Take 1 tablet (10 mg total) by mouth daily at 6 PM. 90 tablet 3  . baclofen (LIORESAL) 10 MG tablet Take 0.5-1 tablets (5-10 mg total) by mouth at bedtime as needed for muscle spasms. 30 each 0  . dexlansoprazole (DEXILANT) 60 MG capsule Take 60 mg by mouth daily.    . fluticasone (FLONASE) 50 MCG/ACT nasal spray Place  2 sprays into both nostrils daily. Use for 4-6 weeks then stop and use seasonally or as needed. 48 g 1  . losartan (COZAAR) 100 MG tablet TAKE 1 TABLET EVERY DAY 90 tablet 1  . polyethylene glycol powder (GLYCOLAX/MIRALAX) powder Take 17-34 g by mouth daily. OTC    . propranolol (INDERAL) 20 MG tablet TAKE 1 TABLET TWICE DAILY 180 tablet 3   No current facility-administered medications for this visit.     Allergies as of 03/10/2019  . (No Known Allergies)    ROS:  General: Negative for anorexia, weight loss, fever, chills, fatigue, weakness. ENT: Negative for hoarseness, difficulty swallowing , nasal congestion. CV: Negative for chest pain, angina, palpitations, dyspnea on exertion, peripheral edema.  Respiratory: Negative for dyspnea at rest, dyspnea on exertion, cough, sputum, wheezing.  GI: See history of present illness. GU:  Negative for dysuria, hematuria, urinary incontinence, urinary frequency, nocturnal urination.  Endo: Negative for unusual weight change.    Physical Examination:   There were no vitals taken for this visit.  General: Well-nourished, well-developed in no acute distress.  Eyes: No icterus. Conjunctivae pink. Mouth: Oropharyngeal mucosa moist and pink , no lesions erythema or exudate. Lungs: Clear to auscultation bilaterally. Non-labored. Heart: Regular rate and rhythm, no murmurs rubs or gallops.  Abdomen: Bowel sounds are normal, nontender, nondistended, no hepatosplenomegaly or masses,  no abdominal bruits or hernia , no rebound or guarding.   Extremities: No lower extremity edema. No clubbing or deformities. Neuro: Alert and oriented x 3.  Grossly intact. Skin: Warm and dry, no jaundice.   Psych: Alert and cooperative, normal mood and affect.   Imaging Studies: No results found.  Assessment and Plan:   Jesse Figueroa is a 68 y.o. y/o male here to follow-up for abdominal discomfort.  His symptoms are nonspecific and sound more like intestinal  colic/spasm.    Agreed this is a form of functional GI disorder.  Plan 1.  He is not keen on proceeding with a CT scan at this point of time.  He is willing to try fiber pills which he did not collect last time after his visit.  I will provide him with the same.  I will also commence him on Bentyl as needed.  If no better in 3 weeks time we will plan to do a CT scan of the abdomen.   Dr Jonathon Bellows  MD,MRCP [facility]) Follow up in 3 weeks

## 2019-03-10 NOTE — Addendum Note (Signed)
Addended by: Dorethea Clan on: 03/10/2019 02:49 PM   Modules accepted: Orders

## 2019-03-14 ENCOUNTER — Other Ambulatory Visit: Payer: Self-pay

## 2019-03-14 DIAGNOSIS — R109 Unspecified abdominal pain: Secondary | ICD-10-CM

## 2019-03-25 ENCOUNTER — Other Ambulatory Visit: Payer: Self-pay | Admitting: Family Medicine

## 2019-03-25 DIAGNOSIS — S161XXA Strain of muscle, fascia and tendon at neck level, initial encounter: Secondary | ICD-10-CM

## 2019-03-28 ENCOUNTER — Ambulatory Visit
Admission: RE | Admit: 2019-03-28 | Discharge: 2019-03-28 | Disposition: A | Discharge status: Home / Self Care | Payer: Medicare HMO | Source: Ambulatory Visit | Attending: Gastroenterology | Admitting: Gastroenterology

## 2019-03-28 ENCOUNTER — Other Ambulatory Visit: Payer: Self-pay

## 2019-03-28 DIAGNOSIS — R109 Unspecified abdominal pain: Secondary | ICD-10-CM | POA: Diagnosis not present

## 2019-03-28 LAB — POCT I-STAT CREATININE: Creatinine, Ser: 1.2 mg/dL (ref 0.61–1.24)

## 2019-03-28 MED ORDER — IOHEXOL 300 MG/ML  SOLN
100.0000 mL | Freq: Once | INTRAMUSCULAR | Status: AC | PRN
  Administered 2019-03-28: 100 mL via INTRAVENOUS

## 2019-04-03 ENCOUNTER — Encounter: Payer: Self-pay | Admitting: Gastroenterology

## 2019-04-04 ENCOUNTER — Other Ambulatory Visit: Payer: Self-pay

## 2019-04-04 ENCOUNTER — Telehealth: Payer: Self-pay

## 2019-04-04 DIAGNOSIS — K769 Liver disease, unspecified: Secondary | ICD-10-CM

## 2019-04-04 NOTE — Telephone Encounter (Signed)
-----   Message from Jonathon Bellows, MD sent at 04/03/2019  8:58 PM EDT ----- Sherald Hess inform CT shows some abnormal lesions in liver and spleen which are not clear as to what they are. Suggest MRI of the abdomen with and without IV gadolinium   C/c Parks Ranger, Devonne Doughty, DO   Dr Jonathon Bellows MD,MRCP [facility]) Gastroenterology/Hepatology Pager: [phone]

## 2019-04-04 NOTE — Telephone Encounter (Signed)
Spoke with pt and informed him of CT scan result and and Dr. Georgeann Oppenheim suggestion. Pt agrees to proceed with the MRI. MRI has been scheduled.

## 2019-04-05 ENCOUNTER — Other Ambulatory Visit: Payer: Self-pay | Admitting: Gastroenterology

## 2019-04-11 ENCOUNTER — Telehealth: Payer: Medicare HMO

## 2019-04-11 ENCOUNTER — Ambulatory Visit: Payer: Self-pay | Admitting: *Deleted

## 2019-04-11 DIAGNOSIS — E7849 Other hyperlipidemia: Secondary | ICD-10-CM

## 2019-04-11 NOTE — Chronic Care Management (AMB) (Signed)
  Chronic Care Management   Outreach Note  04/11/2019 Name: Jesse Figueroa MRN: [id number] DOB: Nov 02, 1950  Referred by: Olin Hauser, DO Reason for referral : Chronic Care Management (Initial OUtreach Unsuccessful x1)   An unsuccessful telephone outreach was attempted today. The patient was referred to the case management team by for assistance with care management and care coordination.   Follow Up Plan: The care management team will reach out to the patient again over the next 60 days.   Unable to leave a VM.   Merlene Morse Buna Cuppett RN, BSN Nurse Case Pharmacist, [facility] Care Management  [phone]) Business Mobile

## 2019-04-18 ENCOUNTER — Encounter: Payer: Self-pay | Admitting: Gastroenterology

## 2019-04-18 ENCOUNTER — Telehealth: Payer: Self-pay | Admitting: Family Medicine

## 2019-04-18 ENCOUNTER — Ambulatory Visit
Admission: RE | Admit: 2019-04-18 | Discharge: 2019-04-18 | Disposition: A | Discharge status: Home / Self Care | Payer: Medicare HMO | Source: Ambulatory Visit | Attending: Gastroenterology | Admitting: Gastroenterology

## 2019-04-18 ENCOUNTER — Other Ambulatory Visit: Payer: Self-pay

## 2019-04-18 ENCOUNTER — Encounter: Payer: Self-pay | Admitting: Family Medicine

## 2019-04-18 ENCOUNTER — Ambulatory Visit (INDEPENDENT_AMBULATORY_CARE_PROVIDER_SITE_OTHER): Payer: Medicare HMO | Admitting: Gastroenterology

## 2019-04-18 VITALS — BP 142/84 | HR 56 | Temp 98.3°F | Ht 71.0 in | Wt 173.6 lb

## 2019-04-18 DIAGNOSIS — R935 Abnormal findings on diagnostic imaging of other abdominal regions, including retroperitoneum: Secondary | ICD-10-CM | POA: Diagnosis not present

## 2019-04-18 DIAGNOSIS — R109 Unspecified abdominal pain: Secondary | ICD-10-CM | POA: Diagnosis not present

## 2019-04-18 DIAGNOSIS — K769 Liver disease, unspecified: Secondary | ICD-10-CM | POA: Diagnosis not present

## 2019-04-18 DIAGNOSIS — D1803 Hemangioma of intra-abdominal structures: Secondary | ICD-10-CM | POA: Diagnosis not present

## 2019-04-18 DIAGNOSIS — N281 Cyst of kidney, acquired: Secondary | ICD-10-CM | POA: Insufficient documentation

## 2019-04-18 MED ORDER — GADOBUTROL 1 MMOL/ML IV SOLN
7.0000 mL | Freq: Once | INTRAVENOUS | Status: AC | PRN
  Administered 2019-04-18: 7 mL via INTRAVENOUS

## 2019-04-18 NOTE — Progress Notes (Signed)
Jonathon Bellows MD, MRCP(U.K) [address]  [place]  [place], [place] [address]  Main: [phone]  Fax: [phone]   Primary Care Physician: Olin Hauser, DO  Primary Gastroenterologist:  Dr. Jonathon Bellows   Follow-up for abdominal discomfort  HPI: Jesse Figueroa is a 68 y.o. male   Summary of history : Was initially referred and seen on 01/20/2019 for functional GI symptoms.Ongoing for over an year. Last colonoscopy in 2017. 3 benign polyps were seen in addition to sigmoid diverticulosis. Recent labs in 12/04/2018 demonstrated a hemoglobin of 13.5 g. Normal LFTs.  He states that for under a year he has had some lower abdominal discomfort which he describes a form of a spasm lasting a few seconds to occasionally a few minutes not any worse with eating but relieved after bowel movement. He has up to 4 formed bowel movements a day. Denies any constipation. Denies any weight loss. The pain is nonradiating and not severe. Denies any NSAID use. He also complains of some congestion in his nose. Some heartburn as well.  Did not respond to Dexilant   Interval history 03/10/2019-04/18/2019  He took the fiber pills which seems to have helped a bit.  Has also taken some Bentyl which he says has helped to a degree. He underwent a CT scan of the abdomen and pelvis on 03/28/2019 that demonstrated no acute findings but showed 2 indeterminate lesions in the liver requiring an MRI.  Multiple renal cysts noted.  MRI of the abdomen subsequently performed on 04/18/2019 showed benign hemangioma of the liver and multiple benign splenic hemangiomas.  3.5 cm benign Bosniak category 30F cystic lesion in the upper pole of the right kidney noted.     Current Outpatient Medications  Medication Sig Dispense Refill   aspirin 81 MG chewable tablet Chew by mouth.     atorvastatin (LIPITOR) 10 MG tablet Take 1 tablet (10 mg total) by mouth daily at 6 PM. 90 tablet 3   baclofen  (LIORESAL) 10 MG tablet TAKE 0.5-1 TABLETS (5-10 MG TOTAL) BY MOUTH AT BEDTIME AS NEEDED FOR MUSCLE SPASMS. 30 tablet 0   dexlansoprazole (DEXILANT) 60 MG capsule Take 60 mg by mouth daily.     dicyclomine (BENTYL) 10 MG capsule TAKE 1 CAPSULE (10 MG TOTAL) BY MOUTH 4 (FOUR) TIMES DAILY - BEFORE MEALS AND AT BEDTIME. AS NEEDED 120 capsule 0   fluticasone (FLONASE) 50 MCG/ACT nasal spray Place 2 sprays into both nostrils daily. Use for 4-6 weeks then stop and use seasonally or as needed. 48 g 1   losartan (COZAAR) 100 MG tablet TAKE 1 TABLET EVERY DAY 90 tablet 1   polyethylene glycol powder (GLYCOLAX/MIRALAX) powder Take 17-34 g by mouth daily. OTC     propranolol (INDERAL) 20 MG tablet TAKE 1 TABLET TWICE DAILY 180 tablet 3   No current facility-administered medications for this visit.     Allergies as of 04/18/2019   (No Known Allergies)    ROS:  General: Negative for anorexia, weight loss, fever, chills, fatigue, weakness. ENT: Negative for hoarseness, difficulty swallowing , nasal congestion. CV: Negative for chest pain, angina, palpitations, dyspnea on exertion, peripheral edema.  Respiratory: Negative for dyspnea at rest, dyspnea on exertion, cough, sputum, wheezing.  GI: See history of present illness. GU:  Negative for dysuria, hematuria, urinary incontinence, urinary frequency, nocturnal urination.  Endo: Negative for unusual weight change.    Physical Examination:   There were no vitals taken for this visit.  General:  Well-nourished, well-developed in no acute distress.  Eyes: No icterus. Conjunctivae pink. Mouth: Oropharyngeal mucosa moist and pink , no lesions erythema or exudate. Lungs: Clear to auscultation bilaterally. Non-labored. Heart: Regular rate and rhythm, no murmurs rubs or gallops.  Abdomen: Bowel sounds are normal, nontender, nondistended, no hepatosplenomegaly or masses, no abdominal bruits or hernia , no rebound or guarding.   Extremities: No lower  extremity edema. No clubbing or deformities. Neuro: Alert and oriented x 3.  Grossly intact. Skin: Warm and dry, no jaundice.   Psych: Alert and cooperative, normal mood and affect.   Imaging Studies: Mr Abdomen W Wo Contrast  Result Date: 04/18/2019 CLINICAL DATA:  Indeterminate hepatic and splenic lesions on recent CT. EXAM: MRI ABDOMEN WITHOUT AND WITH CONTRAST TECHNIQUE: Multiplanar multisequence MR imaging of the abdomen was performed both before and after the administration of intravenous contrast. CONTRAST:  52mL GADAVIST GADOBUTROL 1 MMOL/ML IV SOLN COMPARISON:  CT on 03/28/2019 FINDINGS: Lower chest: No acute findings. Hepatobiliary: A 1.5 cm mass is seen in the posterior right hepatic lobe which has classic characteristics of a benign hemangioma. Another focal area of hyperenhancement is seen in the anterior right hepatic lobe which shows lack contrast washout or restricted diffusion, as well as T2 isointensity. This is most consistent with benign focal nodular hyperplasia. Few other tiny sub-cm cysts are noted. Gallbladder is unremarkable. No evidence of biliary ductal dilatation. Pancreas:  No mass or inflammatory changes. Spleen: No evidence of splenomegaly. Multiple masses are seen throughout the spleen which show early peripheral nodular contrast enhancement and delayed central enhancement which matches blood pool on all phases, consistent with benign hemangiomas. Adrenals/Urinary Tract: Several simple cysts are seen in the left kidney. 3.5 x 3.3 cm complex cyst is seen in the pole the right kidney which shows mild contour irregularity in several thin internal septations which show contrast enhancement. No thickened septations or solid component visualized. No evidence of hydronephrosis. Stomach/Bowel: Visualized portion unremarkable. Vascular/Lymphatic: No pathologically enlarged lymph nodes identified. No abdominal aortic aneurysm. Other:  None. Musculoskeletal:  No suspicious bone lesions  identified. IMPRESSION: Small benign hemangioma and focal nodular hyperplasia in the right hepatic lobe, corresponding with lesions seen on recent CT. Multiple benign splenic hemangiomas, corresponding with lesions seen on recent CT. 3.5 cm probably benign Bosniak category 71F cystic lesion in upper pole of right kidney. Recommend continued follow-up by MRI in 6 months. This recommendation follows ACR consensus guidelines: Management of the Incidental Renal Mass on CT: A White Paper of the ACR Incidental Findings Committee. J Am Coll Radiol [id number]. Electronically Signed   By: Marlaine Hind M.D.   On: 04/18/2019 08:42   Ct Abdomen Pelvis W Contrast  Result Date: 03/28/2019 CLINICAL DATA:  68 year old male with history of abdominal pain and pelvic discomfort for the past 2 weeks. EXAM: CT ABDOMEN AND PELVIS WITH CONTRAST TECHNIQUE: Multidetector CT imaging of the abdomen and pelvis was performed using the standard protocol following bolus administration of intravenous contrast. CONTRAST:  134mL OMNIPAQUE IOHEXOL 300 MG/ML  SOLN COMPARISON:  No priors. FINDINGS: Lower chest: Calcifications of the aortic valve. Hepatobiliary: There are 2 indeterminate hepatic lesions in the right lobe of the liver. Specifically, in segment 5 there is a 1.6 x 1.2 cm hypervascular lesion (axial image 22 of series 2) measuring 1.6 x 1.2 cm. Additionally, in segment 6 (axial image 24 of series 2) there is a 1.5 x 1.5 cm hypovascular lesion which is less apparent on delayed images, potentially a cavernous hemangioma.  No other larger more suspicious appearing hepatic lesions. No intra or extrahepatic biliary ductal dilatation. Gallbladder is normal in appearance. Pancreas: No pancreatic mass. No pancreatic ductal dilatation. No pancreatic or peripancreatic fluid collections or inflammatory changes. Spleen: Multiple well-circumscribed hypervascular lesions in the spleen, not apparent on the delayed images, incompletely  characterized but favored to represent cavernous hemangiomas. The largest of these measures up to 1.9 cm in diameter in the inferior aspect of the spleen. Adrenals/Urinary Tract: Multiple low-attenuation lesions in both kidneys, compatible with simple cysts, largest of which is exophytic in the upper pole the left kidney measuring 9.2 cm. No suspicious renal lesions. No hydroureteronephrosis. Urinary bladder is normal in appearance. Bilateral adrenal glands are normal in appearance. Stomach/Bowel: Normal appearance of the stomach. No pathologic dilatation of small bowel or colon. Normal appendix. Vascular/Lymphatic: Aortic atherosclerosis, without evidence of aneurysm or dissection in the abdominal or pelvic vasculature. No lymphadenopathy noted in the abdomen or pelvis. Reproductive: Prostate gland and seminal vesicles are unremarkable in appearance. Other: No significant volume of ascites.  No pneumoperitoneum. Musculoskeletal: 1.2 x 0.8 cm well-defined sclerotic lesion with narrow zone of transition in the posterior aspect of the L2 vertebral body extending into the pedicle, likely to represent a bone island. There are no other aggressive appearing lytic or blastic lesions noted in the visualized portions of the skeleton. IMPRESSION: 1. No acute findings are noted in the abdomen or pelvis to account for the patient's symptoms. 2. There are 2 indeterminate lesions in the liver, and multiple indeterminate lesions in the spleen. Further characterization with nonemergent MRI of the abdomen with and without IV gadolinium is recommended in the near future to better evaluate these findings. 3. Aortic atherosclerosis. 4. There are calcifications of the aortic valve. Echocardiographic correlation for evaluation of potential valvular dysfunction may be warranted if clinically indicated. 5. Multiple bilateral renal cysts, as above. Electronically Signed   By: Vinnie Langton M.D.   On: 03/28/2019 09:51    Assessment and  Plan:   Jesse Figueroa is a 68 y.o. y/o male here to follow-up for abdominal discomfort.  His symptoms are nonspecific and sound more like intestinal colic/spasm.    He has a nonspecific 3.5 cm lesion on his kidney which needs follow-up in 6 months with a repeat MRI   Plan 1.    In addition to Bentyl and high-fiber diet.  I would suggest we commence him on IBgard.  Samples will be provided.  If that does not work our options include trial of Levsin or amitriptyline or both  2.  Suggested to follow-up with Olin Hauser, DO to repeat MRI in 6 months to follow the renal mass.  I will CC my note.  This I have also discussed with the patient.  I provided him a copy of the MRI report from earlier this morning    Dr Jonathon Bellows  MD,MRCP [facility]) Follow up in 4 weeks telephone visit

## 2019-04-18 NOTE — Telephone Encounter (Signed)
Patient followed by Dr Vicente Males, recently had MRI Abdomen.  Showed incidental abnormality of R kidney w/ cyst.  IMPRESSION: Small benign hemangioma and focal nodular hyperplasia in the right hepatic lobe, corresponding with lesions seen on recent CT.  Multiple benign splenic hemangiomas, corresponding with lesions seen on recent CT.  3.5 cm probably benign Bosniak category 12F cystic lesion in upper pole of right kidney. Recommend continued follow-up by MRI in 6 Months  GI requested that we follow-up with R kidney cyst.  Could you contact patient to confirm that he is aware of this abnormal finding of Cyst on R kidney and that it was recommended to repeat MRI in 6 months - approximately April - May 2021?  He can notify us about 1 month before he is due for the MRI - and request the orders and schedule a follow-up apt to go over his results, then we can place order at that time.  [name], [facility] 04/18/2019, 6:06 PM

## 2019-04-19 NOTE — Telephone Encounter (Signed)
Patient's spouse in [place] informed patient will call if he has any question.

## 2019-04-21 ENCOUNTER — Other Ambulatory Visit: Payer: Self-pay | Admitting: Family Medicine

## 2019-04-21 DIAGNOSIS — J3089 Other allergic rhinitis: Secondary | ICD-10-CM

## 2019-04-24 ENCOUNTER — Other Ambulatory Visit: Payer: Self-pay | Admitting: Nurse Practitioner

## 2019-04-24 DIAGNOSIS — S161XXA Strain of muscle, fascia and tendon at neck level, initial encounter: Secondary | ICD-10-CM

## 2019-04-27 ENCOUNTER — Other Ambulatory Visit: Payer: Self-pay | Admitting: Family Medicine

## 2019-04-27 DIAGNOSIS — R251 Tremor, unspecified: Secondary | ICD-10-CM

## 2019-05-05 ENCOUNTER — Other Ambulatory Visit: Payer: Self-pay | Admitting: Family Medicine

## 2019-05-05 DIAGNOSIS — I10 Essential (primary) hypertension: Secondary | ICD-10-CM

## 2019-05-07 ENCOUNTER — Other Ambulatory Visit: Payer: Self-pay | Admitting: Gastroenterology

## 2019-05-16 ENCOUNTER — Telehealth: Payer: Self-pay

## 2019-05-18 ENCOUNTER — Ambulatory Visit (INDEPENDENT_AMBULATORY_CARE_PROVIDER_SITE_OTHER): Payer: Medicare HMO | Admitting: Gastroenterology

## 2019-05-18 DIAGNOSIS — R109 Unspecified abdominal pain: Secondary | ICD-10-CM

## 2019-05-18 NOTE — Progress Notes (Signed)
Jesse Figueroa , MD [address]  [place]  [place], [place] [address]  Main: [phone]  Fax: [phone]   Primary Care Physician: Olin Hauser, DO  Virtual Visit via Telephone Note  I connected with patient on 05/18/19 at 11:30 AM EST by telephone and verified that I am speaking with the correct person using two identifiers.   I discussed the limitations, risks, security and privacy concerns of performing an evaluation and management service by telephone and the availability of in person appointments. I also discussed with the patient that there may be a patient responsible charge related to this service. The patient expressed understanding and agreed to proceed.  Location of Patient: Home Location of Provider: Home Persons involved: Patient and provider only   History of Present Illness:  Dyspepsia   HPI: Jesse Figueroa is a 68 y.o. male   Summary of history : Was initially referred and seen on 01/20/2019 for functional GI symptoms.Ongoing for over an year. Last colonoscopy in 2017. 3 benign polyps were seen in addition to sigmoid diverticulosis. Recent labs in 12/04/2018 demonstrated a hemoglobin of 13.5 g. Normal LFTs.  He states that for under a year he has had some lower abdominal discomfort which he describes a form of a spasm lasting a few seconds to occasionally a few minutes not any worse with eating but relieved after bowel movement. He has up to 4 formed bowel movements a day. Denies any constipation. Denies any weight loss. The pain is nonradiating and not severe. Denies any NSAID use. He also complains of some congestion in his nose. Some heartburn as well.  Did not respond to [place].  Partially responded to high-fiber diet with pills and Bentyl. He underwent a CT scan of the abdomen and pelvis on 03/28/2019 that demonstrated no acute findings but showed 2 indeterminate lesions in the liver requiring an MRI.  Multiple renal cysts noted.   MRI of the abdomen subsequently performed on 04/18/2019 showed benign hemangioma of the liver and multiple benign splenic hemangiomas.  3.5 cm benign Bosniak category 79F cystic lesion in the upper pole of the right kidney noted.   Interval history11/07/2018-05/18/2019  Did not tolerate the IBgard causing her severe cramping.  All issues resolved after stopping the IBgard.  Doing well otherwise.    Current Outpatient Medications  Medication Sig Dispense Refill  . amoxicillin (AMOXIL) 500 MG capsule     . aspirin 81 MG chewable tablet Chew by mouth.    Marland Kitchen atorvastatin (LIPITOR) 10 MG tablet Take 1 tablet (10 mg total) by mouth daily at 6 PM. 90 tablet 3  . baclofen (LIORESAL) 10 MG tablet TAKE 0.5-1 TABLETS (5-10 MG TOTAL) BY MOUTH AT BEDTIME AS NEEDED FOR MUSCLE SPASMS. 30 tablet 2  . dexlansoprazole (DEXILANT) 60 MG capsule Take 60 mg by mouth daily.    Marland Kitchen dicyclomine (BENTYL) 10 MG capsule TAKE 1 CAPSULE (10 MG TOTAL) BY MOUTH 4 (FOUR) TIMES DAILY - BEFORE MEALS AND AT BEDTIME. AS NEEDED 120 capsule 0  . fluticasone (FLONASE) 50 MCG/ACT nasal spray USE 2 SPRAYS IN BOTH NOSTRILS DAILY. USE FOR 4 TO 6 WEEKS THEN STOP AND USE SEASONALLY OR AS NEEDED. 48 g 1  . losartan (COZAAR) 100 MG tablet TAKE 1 TABLET EVERY DAY 90 tablet 1  . polyethylene glycol powder (GLYCOLAX/MIRALAX) powder Take 17-34 g by mouth daily. OTC    . propranolol (INDERAL) 20 MG tablet TAKE 1 TABLET TWICE DAILY 180 tablet 3  No current facility-administered medications for this visit.     Allergies as of 05/18/2019  . (No Known Allergies)    Review of Systems:    All systems reviewed and negative except where noted in HPI.   Observations/Objective:  Labs: CMP     Component Value Date/Time   NA 139 11/23/2018 0931   NA 142 10/15/2015 0914   K 4.4 11/23/2018 0931   CL 105 11/23/2018 0931   CO2 26 11/23/2018 0931   GLUCOSE 100 (H) 11/23/2018 0931   BUN 15 11/23/2018 0931   BUN 11 10/15/2015 0914   CREATININE  1.20 03/28/2019 0915   CREATININE 1.22 (H) 11/23/2018 0931   CALCIUM 9.2 11/23/2018 0931   PROT 6.9 11/23/2018 0931   PROT 6.6 10/15/2015 0914   ALBUMIN 4.2 08/28/2016 0001   ALBUMIN 4.2 10/15/2015 0914   AST 19 11/23/2018 0931   ALT 21 11/23/2018 0931   ALKPHOS 109 08/28/2016 0001   BILITOT 0.5 11/23/2018 0931   BILITOT 0.6 10/15/2015 0914   GFRNONAA 60 11/23/2018 0931   GFRAA 69 11/23/2018 0931   Lab Results  Component Value Date   WBC 3.9 11/23/2018   HGB 13.5 11/23/2018   HCT 43.2 11/23/2018   MCV 87.4 11/23/2018   PLT 334 11/23/2018    Imaging Studies: No results found.  Assessment and Plan:   [name] is a 68 y.o. y/o male here to follow-up for abdominal discomfort.His symptoms are nonspecific and sound more like intestinal colic/spasm.  He has a nonspecific 3.5 cm lesion on his kidney which needs follow-up in 6 months with a repeat MRI   Plan 1.Continue the high-fiber diet and Bentyl as needed.  He says he is doing well.  If symptoms were to recur please refer him back to see me.   I discussed the assessment and treatment plan with the patient. The patient was provided an opportunity to ask questions and all were answered. The patient agreed with the plan and demonstrated an understanding of the instructions.   The patient was advised to call back or seek an in-person evaluation if the symptoms worsen or if the condition fails to improve as anticipated.  I provided 10 minutes of non-face-to-face time during this encounter.  Dr Jesse Bellows MD,MRCP [facility]) Gastroenterology/Hepatology Pager: [phone]   Speech recognition software was used to dictate this note.

## 2019-06-20 ENCOUNTER — Other Ambulatory Visit: Payer: Self-pay | Admitting: Gastroenterology

## 2019-07-07 ENCOUNTER — Encounter: Payer: Self-pay | Admitting: General Practice

## 2019-07-07 ENCOUNTER — Telehealth: Payer: Self-pay | Admitting: General Practice

## 2019-07-07 ENCOUNTER — Ambulatory Visit (INDEPENDENT_AMBULATORY_CARE_PROVIDER_SITE_OTHER): Payer: Medicare HMO | Admitting: General Practice

## 2019-07-07 DIAGNOSIS — E7849 Other hyperlipidemia: Secondary | ICD-10-CM

## 2019-07-07 DIAGNOSIS — I129 Hypertensive chronic kidney disease with stage 1 through stage 4 chronic kidney disease, or unspecified chronic kidney disease: Secondary | ICD-10-CM | POA: Diagnosis not present

## 2019-07-07 DIAGNOSIS — N182 Chronic kidney disease, stage 2 (mild): Secondary | ICD-10-CM

## 2019-07-07 NOTE — Patient Instructions (Signed)
Visit Information  Goals Addressed            This Visit's Progress   . RNCM: I do want to know how to better manage my hypertension/also I am still having issues with my stomach       Current Barriers:  . Chronic Disease Management support, education, and care coordination needs related to HTN, HLD, CKD Stage 2, and stomach issues . COVID Vaccination concerns/questions  Clinical Goal(s) related to: HTN, HLD, CKD Stage 2, and stomach issues COVID Vaccination concerns/Questions Over the next 60 days, patient will:  . Work with the care management team to address educational, disease management, and care coordination needs  . Begin or continue self health monitoring activities as directed today Measure and record blood pressure 5 to 6 times per week and record  and monitor for factors that trigger issues related to stomach issues and concerns . Call provider office for new or worsened signs and symptoms Blood pressure findings outside established parameters, New or worsened symptom related to stomach issues, and changes in chronic disease process . Call care management team with questions or concerns . Verbalize basic understanding of patient centered plan of care established today . Receive desired COVID19 vaccination   Interventions related to HTN, HLD, CKD Stage 2, and stomach issues . Evaluation of current treatment plans and patient's adherence to plan as established by provider- front office staff to call the patient and schedule an appointment to be seen by the pcp . Assessed patient understanding of disease states . Assessed patient's education and care coordination needs, the patient declines assistance of the pharmacist and social worker at this time; however knows they are available to assist with any new concerns/help . Provided disease specific education to patient to assist with managing his HTN, and other chronic medical conditions . Collaborated with appropriate clinical  care team members regarding patient needs  Patient Self Care Activities related to HTN, HLD, CKD Stage 2, and stomach issues . Patient is unable to independently self-manage chronic health conditions  Initial goal documentation        Mr. Rockhill was given information about Chronic Care Management services today including:  1. CCM service includes personalized support from designated clinical staff supervised by his physician, including individualized plan of care and coordination with other care providers 2. 24/7 contact phone numbers for assistance for urgent and routine care needs. 3. Service will only be billed when office clinical staff spend 20 minutes or more in a month to coordinate care. 4. Only one practitioner may furnish and bill the service in a calendar month. 5. The patient may stop CCM services at any time (effective at the end of the month) by phone call to the office staff. 6. The patient will be responsible for cost sharing (co-pay) of up to 20% of the service fee (after annual deductible is met).  Patient agreed to services and verbal consent obtained.   The patient verbalized understanding of instructions provided today and declined a print copy of patient instruction materials.   Telephone follow up appointment with care management team member scheduled for: 08-29-2019 at 0900  [place], MSN, [facility] Mobile: [phone]  Low-Sodium Eating Plan Sodium, which is an element that makes up salt, helps you maintain a healthy balance of fluids in your body. Too much sodium can increase your blood pressure and cause fluid and waste to be held in  your body. Your health care provider or dietitian may recommend following this plan if you have high blood pressure (hypertension), kidney disease, liver disease, or heart failure. Eating less sodium can help lower your blood pressure,  reduce swelling, and protect your heart, liver, and kidneys. What are tips for following this plan? General guidelines  Most people on this plan should limit their sodium intake to 1,500-2,000 mg (milligrams) of sodium each day. Reading food labels   The Nutrition Facts label lists the amount of sodium in one serving of the food. If you eat more than one serving, you must multiply the listed amount of sodium by the number of servings.  Choose foods with less than 140 mg of sodium per serving.  Avoid foods with 300 mg of sodium or more per serving. Shopping  Look for lower-sodium products, often labeled as "low-sodium" or "no salt added."  Always check the sodium content even if foods are labeled as "unsalted" or "no salt added".  Buy fresh foods. ? Avoid canned foods and premade or frozen meals. ? Avoid canned, cured, or processed meats  Buy breads that have less than 80 mg of sodium per slice. Cooking  Eat more home-cooked food and less restaurant, buffet, and fast food.  Avoid adding salt when cooking. Use salt-free seasonings or herbs instead of table salt or sea salt. Check with your health care provider or pharmacist before using salt substitutes.  Cook with plant-based oils, such as canola, sunflower, or olive oil. Meal planning  When eating at a restaurant, ask that your food be prepared with less salt or no salt, if possible.  Avoid foods that contain MSG (monosodium glutamate). MSG is sometimes added to [place] food, bouillon, and some canned foods. What foods are recommended? The items listed may not be a complete list. Talk with your dietitian about what dietary choices are best for you. Grains Low-sodium cereals, including oats, puffed wheat and rice, and shredded wheat. Low-sodium crackers. Unsalted rice. Unsalted pasta. Low-sodium bread. Whole-grain breads and whole-grain pasta. Vegetables Fresh or frozen vegetables. "No salt added" canned vegetables. "No salt  added" tomato sauce and paste. Low-sodium or reduced-sodium tomato and vegetable juice. Fruits Fresh, frozen, or canned fruit. Fruit juice. Meats and other protein foods Fresh or frozen (no salt added) meat, poultry, seafood, and fish. Low-sodium canned tuna and salmon. Unsalted nuts. Dried peas, beans, and lentils without added salt. Unsalted canned beans. Eggs. Unsalted nut butters. Dairy Milk. Soy milk. Cheese that is naturally low in sodium, such as ricotta cheese, fresh mozzarella, or Swiss cheese Low-sodium or reduced-sodium cheese. Cream cheese. Yogurt. Fats and oils Unsalted butter. Unsalted margarine with no trans fat. Vegetable oils such as canola or olive oils. Seasonings and other foods Fresh and dried herbs and spices. Salt-free seasonings. Low-sodium mustard and ketchup. Sodium-free salad dressing. Sodium-free light mayonnaise. Fresh or refrigerated horseradish. Lemon juice. Vinegar. Homemade, reduced-sodium, or low-sodium soups. Unsalted popcorn and pretzels. Low-salt or salt-free chips. What foods are not recommended? The items listed may not be a complete list. Talk with your dietitian about what dietary choices are best for you. Grains Instant hot cereals. Bread stuffing, pancake, and biscuit mixes. Croutons. Seasoned rice or pasta mixes. Noodle soup cups. Boxed or frozen macaroni and cheese. Regular salted crackers. Self-rising flour. Vegetables Sauerkraut, pickled vegetables, and relishes. Olives. [place] fries. Onion rings. Regular canned vegetables (not low-sodium or reduced-sodium). Regular canned tomato sauce and paste (not low-sodium or reduced-sodium). Regular tomato and vegetable juice (not low-sodium or  reduced-sodium). Frozen vegetables in sauces. Meats and other protein foods Meat or fish that is salted, canned, smoked, spiced, or pickled. Bacon, ham, sausage, hotdogs, corned beef, chipped beef, packaged lunch meats, salt pork, jerky, pickled herring, anchovies, regular  canned tuna, sardines, salted nuts. Dairy Processed cheese and cheese spreads. Cheese curds. Blue cheese. Feta cheese. String cheese. Regular cottage cheese. Buttermilk. Canned milk. Fats and oils Salted butter. Regular margarine. Ghee. Bacon fat. Seasonings and other foods Onion salt, garlic salt, seasoned salt, table salt, and sea salt. Canned and packaged gravies. Worcestershire sauce. Tartar sauce. Barbecue sauce. Teriyaki sauce. Soy sauce, including reduced-sodium. Steak sauce. Fish sauce. Oyster sauce. Cocktail sauce. Horseradish that you find on the shelf. Regular ketchup and mustard. Meat flavorings and tenderizers. Bouillon cubes. Hot sauce and Tabasco sauce. Premade or packaged marinades. Premade or packaged taco seasonings. Relishes. Regular salad dressings. Salsa. Potato and tortilla chips. Corn chips and puffs. Salted popcorn and pretzels. Canned or dried soups. Pizza. Frozen entrees and pot pies. Summary  Eating less sodium can help lower your blood pressure, reduce swelling, and protect your heart, liver, and kidneys.  Most people on this plan should limit their sodium intake to 1,500-2,000 mg (milligrams) of sodium each day.  Canned, boxed, and frozen foods are high in sodium. Restaurant foods, fast foods, and pizza are also very high in sodium. You also get sodium by adding salt to food.  Try to cook at home, eat more fresh fruits and vegetables, and eat less fast food, canned, processed, or prepared foods. This information is not intended to replace advice given to you by your health care provider. Make sure you discuss any questions you have with your health care provider. Document Revised: 05/15/2017 Document Reviewed: 05/26/2016 Elsevier Patient Education  2020 [place].  COVID-19 COVID-19 is a respiratory infection that is caused by a virus called severe acute respiratory syndrome coronavirus 2 (SARS-CoV-2). The disease is also known as coronavirus disease or novel  coronavirus. In some people, the virus may not cause any symptoms. In others, it may cause a serious infection. The infection can get worse quickly and can lead to complications, such as:  Pneumonia, or infection of the lungs.  Acute respiratory distress syndrome or ARDS. This is a condition in which fluid build-up in the lungs prevents the lungs from filling with air and passing oxygen into the blood.  Acute respiratory failure. This is a condition in which there is not enough oxygen passing from the lungs to the body or when carbon dioxide is not passing from the lungs out of the body.  Sepsis or septic shock. This is a serious bodily reaction to an infection.  Blood clotting problems.  Secondary infections due to bacteria or fungus.  Organ failure. This is when your body's organs stop working. The virus that causes COVID-19 is contagious. This means that it can spread from person to person through droplets from coughs and sneezes (respiratory secretions). What are the causes? This illness is caused by a virus. You may catch the virus by:  Breathing in droplets from an infected person. Droplets can be spread by a person breathing, speaking, singing, coughing, or sneezing.  Touching something, like a table or a doorknob, that was exposed to the virus (contaminated) and then touching your mouth, nose, or eyes. What increases the risk? Risk for infection You are more likely to be infected with this virus if you:  Are within 6 feet (2 meters) of a person with COVID-19.  Provide care for or live with a person who is infected with COVID-19.  Spend time in crowded indoor spaces or live in shared housing. Risk for serious illness You are more likely to become seriously ill from the virus if you:  Are 41 years of age or older. The higher your age, the more you are at risk for serious illness.  Live in a nursing home or long-term care facility.  Have cancer.  Have a long-term  (chronic) disease such as: ? Chronic lung disease, including chronic obstructive pulmonary disease or asthma. ? A long-term disease that lowers your body's ability to fight infection (immunocompromised). ? Heart disease, including heart failure, a condition in which the arteries that lead to the heart become narrow or blocked (coronary artery disease), a disease which makes the heart muscle thick, weak, or stiff (cardiomyopathy). ? Diabetes. ? Chronic kidney disease. ? Sickle cell disease, a condition in which red blood cells have an abnormal "sickle" shape. ? Liver disease.  Are obese. What are the signs or symptoms? Symptoms of this condition can range from mild to severe. Symptoms may appear any time from 2 to 14 days after being exposed to the virus. They include:  A fever or chills.  A cough.  Difficulty breathing.  Headaches, body aches, or muscle aches.  Runny or stuffy (congested) nose.  A sore throat.  New loss of taste or smell. Some people may also have stomach problems, such as nausea, vomiting, or diarrhea. Other people may not have any symptoms of COVID-19. How is this diagnosed? This condition may be diagnosed based on:  Your signs and symptoms, especially if: ? You live in an area with a COVID-19 outbreak. ? You recently traveled to or from an area where the virus is common. ? You provide care for or live with a person who was diagnosed with COVID-19. ? You were exposed to a person who was diagnosed with COVID-19.  A physical exam.  Lab tests, which may include: ? Taking a sample of fluid from the back of your nose and throat (nasopharyngeal fluid), your nose, or your throat using a swab. ? A sample of mucus from your lungs (sputum). ? Blood tests.  Imaging tests, which may include, X-rays, CT scan, or ultrasound. How is this treated? At present, there is no medicine to treat COVID-19. Medicines that treat other diseases are being used on a trial basis to  see if they are effective against COVID-19. Your health care provider will talk with you about ways to treat your symptoms. For most people, the infection is mild and can be managed at home with rest, fluids, and over-the-counter medicines. Treatment for a serious infection usually takes places in a hospital intensive care unit (ICU). It may include one or more of the following treatments. These treatments are given until your symptoms improve.  Receiving fluids and medicines through an IV.  Supplemental oxygen. Extra oxygen is given through a tube in the nose, a face mask, or a hood.  Positioning you to lie on your stomach (prone position). This makes it easier for oxygen to get into the lungs.  Continuous positive airway pressure (CPAP) or bi-level positive airway pressure (BPAP) machine. This treatment uses mild air pressure to keep the airways open. A tube that is connected to a motor delivers oxygen to the body.  Ventilator. This treatment moves air into and out of the lungs by using a tube that is placed in your windpipe.  Tracheostomy. This  is a procedure to create a hole in the neck so that a breathing tube can be inserted.  Extracorporeal membrane oxygenation (ECMO). This procedure gives the lungs a chance to recover by taking over the functions of the heart and lungs. It supplies oxygen to the body and removes carbon dioxide. Follow these instructions at home: Lifestyle  If you are sick, stay home except to get medical care. Your health care provider will tell you how long to stay home. Call your health care provider before you go for medical care.  Rest at home as told by your health care provider.  Do not use any products that contain nicotine or tobacco, such as cigarettes, e-cigarettes, and chewing tobacco. If you need help quitting, ask your health care provider.  Return to your normal activities as told by your health care provider. Ask your health care provider what  activities are safe for you. General instructions  Take over-the-counter and prescription medicines only as told by your health care provider.  Drink enough fluid to keep your urine pale yellow.  Keep all follow-up visits as told by your health care provider. This is important. How is this prevented?  There is no vaccine to help prevent COVID-19 infection. However, there are steps you can take to protect yourself and others from this virus. To protect yourself:   Do not travel to areas where COVID-19 is a risk. The areas where COVID-19 is reported change often. To identify high-risk areas and travel restrictions, check the CDC travel website: FatFares.com.br  If you live in, or must travel to, an area where COVID-19 is a risk, take precautions to avoid infection. ? Stay away from people who are sick. ? Wash your hands often with soap and water for 20 seconds. If soap and water are not available, use an alcohol-based hand sanitizer. ? Avoid touching your mouth, face, eyes, or nose. ? Avoid going out in public, follow guidance from your state and local health authorities. ? If you must go out in public, wear a cloth face covering or face mask. Make sure your mask covers your nose and mouth. ? Avoid crowded indoor spaces. Stay at least 6 feet (2 meters) away from others. ? Disinfect objects and surfaces that are frequently touched every day. This may include:  Counters and tables.  Doorknobs and light switches.  Sinks and faucets.  Electronics, such as phones, remote controls, keyboards, computers, and tablets. To protect others: If you have symptoms of COVID-19, take steps to prevent the virus from spreading to others.  If you think you have a COVID-19 infection, contact your health care provider right away. Tell your health care team that you think you may have a COVID-19 infection.  Stay home. Leave your house only to seek medical care. Do not use public  transport.  Do not travel while you are sick.  Wash your hands often with soap and water for 20 seconds. If soap and water are not available, use alcohol-based hand sanitizer.  Stay away from other members of your household. Let healthy household members care for children and pets, if possible. If you have to care for children or pets, wash your hands often and wear a mask. If possible, stay in your own room, separate from others. Use a different bathroom.  Make sure that all people in your household wash their hands well and often.  Cough or sneeze into a tissue or your sleeve or elbow. Do not cough or sneeze into your  hand or into the air.  Wear a cloth face covering or face mask. Make sure your mask covers your nose and mouth. Where to find more information  Centers for Disease Control and Prevention: PurpleGadgets.be  World Health Organization: https://www.castaneda.info/ Contact a health care provider if:  You live in or have traveled to an area where COVID-19 is a risk and you have symptoms of the infection.  You have had contact with someone who has COVID-19 and you have symptoms of the infection. Get help right away if:  You have trouble breathing.  You have pain or pressure in your chest.  You have confusion.  You have bluish lips and fingernails.  You have difficulty waking from sleep.  You have symptoms that get worse. These symptoms may represent a serious problem that is an emergency. Do not wait to see if the symptoms will go away. Get medical help right away. Call your local emergency services (911 in the [place] Do not drive yourself to the hospital. Let the emergency medical personnel know if you think you have COVID-19. Summary  COVID-19 is a respiratory infection that is caused by a virus. It is also known as coronavirus disease or novel coronavirus. It can cause serious infections, such as pneumonia, acute respiratory  distress syndrome, acute respiratory failure, or sepsis.  The virus that causes COVID-19 is contagious. This means that it can spread from person to person through droplets from breathing, speaking, singing, coughing, or sneezing.  You are more likely to develop a serious illness if you are 2 years of age or older, have a weak immune system, live in a nursing home, or have chronic disease.  There is no medicine to treat COVID-19. Your health care provider will talk with you about ways to treat your symptoms.  Take steps to protect yourself and others from infection. Wash your hands often and disinfect objects and surfaces that are frequently touched every day. Stay away from people who are sick and wear a mask if you are sick. This information is not intended to replace advice given to you by your health care provider. Make sure you discuss any questions you have with your health care provider. Document Revised: 04/01/2019 Document Reviewed: 07/08/2018 Elsevier Patient Education  [place].

## 2019-07-07 NOTE — Chronic Care Management (AMB) (Signed)
Chronic Care Management   Initial Visit Note  07/07/2019 Name: Jesse Figueroa MRN: [id number] DOB: [birth year]  Referred by: Olin Hauser, DO Reason for referral : Chronic Care Management (Initial Outreach: HTN, HLD, CKD)   Jesse Figueroa is a 69 y.o. year old male who is a primary care patient of Olin Hauser, DO. The CCM team was consulted for assistance with chronic disease management and care coordination needs related to HTN, HLD and CKD Stage 2  Review of patient status, including review of consultants reports, relevant laboratory and other test results, and collaboration with appropriate care team members and the patient's provider was performed as part of comprehensive patient evaluation and provision of chronic care management services.    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 See Care Plan for related entries.   Medications: Outpatient Encounter Medications as of 07/07/2019  Medication Sig  . aspirin 81 MG chewable tablet Chew by mouth.  Marland Kitchen atorvastatin (LIPITOR) 10 MG tablet Take 1 tablet (10 mg total) by mouth daily at 6 PM.  . baclofen (LIORESAL) 10 MG tablet TAKE 0.5-1 TABLETS (5-10 MG TOTAL) BY MOUTH AT BEDTIME AS NEEDED FOR MUSCLE SPASMS.  . fluticasone (FLONASE) 50 MCG/ACT nasal spray USE 2 SPRAYS IN BOTH NOSTRILS DAILY. USE FOR 4 TO 6 WEEKS THEN STOP AND USE SEASONALLY OR AS NEEDED.  Marland Kitchen losartan (COZAAR) 100 MG tablet TAKE 1 TABLET EVERY DAY  . propranolol (INDERAL) 20 MG tablet TAKE 1 TABLET TWICE DAILY  . amoxicillin (AMOXIL) 500 MG capsule   . dexlansoprazole (DEXILANT) 60 MG capsule Take 60 mg by mouth daily.  Marland Kitchen dicyclomine (BENTYL) 10 MG capsule TAKE 1 CAPSULE (10 MG TOTAL) BY MOUTH 4 (FOUR) TIMES DAILY - BEFORE MEALS AND AT BEDTIME. AS NEEDED (Patient not taking: Reported on 07/07/2019)  . polyethylene glycol powder  (GLYCOLAX/MIRALAX) powder Take 17-34 g by mouth daily. OTC   No facility-administered encounter medications on file as of 07/07/2019.    OBJECTIVE:  BP Readings from Last 3 Encounters:  04/18/19 (!) 142/84  03/10/19 138/80  03/01/19 124/72   BP 137/80  Per the patient today during the call    Goals Addressed            This Visit's Progress   . RNCM: I do want to know how to better manage my hypertension/also I am still having issues with my stomach       Current Barriers:  . Chronic Disease Management support, education, and care coordination needs related to HTN, HLD, CKD Stage 2, and stomach issues . COVID Vaccination concerns/questions  Clinical Goal(s) related to: HTN, HLD, CKD Stage 2, and stomach issues COVID Vaccination concerns/Questions Over the next 60 days, patient will:  . Work with the care management team to address educational, disease management, and care coordination needs  . Begin or continue self health monitoring activities as directed today Measure and record blood pressure 5 to 6 times per week and record  and monitor for factors that trigger issues related to stomach issues and concerns . Call provider office for new or worsened signs and symptoms Blood pressure findings outside established parameters, New or worsened symptom related to stomach issues, and changes in chronic disease process . Call care management team with questions or concerns . Verbalize basic understanding of patient centered plan of care established today . Receive desired COVID19 vaccination   Interventions related to  HTN, HLD, CKD Stage 2, and stomach issues . Evaluation of current treatment plans and patient's adherence to plan as established by provider- front office staff to call the patient and schedule an appointment to be seen by the pcp . Assessed patient understanding of disease states . Assessed patient's education and care coordination needs, the patient declines  assistance of the pharmacist and social worker at this time; however knows they are available to assist with any new concerns/help . Provided disease specific education to patient to assist with managing his HTN, and other chronic medical conditions . Collaborated with appropriate clinical care team members regarding patient needs  Patient Self Care Activities related to HTN, HLD, CKD Stage 2, and stomach issues . Patient is unable to independently self-manage chronic health conditions  Initial goal documentation         Mr. Teall was given information about Chronic Care Management services today including:  1. CCM service includes personalized support from designated clinical staff supervised by his physician, including individualized plan of care and coordination with other care providers 2. 24/7 contact phone numbers for assistance for urgent and routine care needs. 3. Service will only be billed when office clinical staff spend 20 minutes or more in a month to coordinate care. 4. Only one practitioner may furnish and bill the service in a calendar month. 5. The patient may stop CCM services at any time (effective at the end of the month) by phone call to the office staff. 6. The patient will be responsible for cost sharing (co-pay) of up to 20% of the service fee (after annual deductible is met).  Patient agreed to services and verbal consent obtained.   Plan:   Telephone follow up appointment with care management team member scheduled for: 08-29-2019 at 0900 am  Noreene Larsson RN, MSN, [place] Mobile: [phone]

## 2019-07-25 ENCOUNTER — Other Ambulatory Visit: Payer: Self-pay | Admitting: Family Medicine

## 2019-07-25 ENCOUNTER — Other Ambulatory Visit: Payer: Self-pay

## 2019-07-25 ENCOUNTER — Encounter: Payer: Self-pay | Admitting: Family Medicine

## 2019-07-25 ENCOUNTER — Ambulatory Visit (INDEPENDENT_AMBULATORY_CARE_PROVIDER_SITE_OTHER): Payer: Medicare HMO | Admitting: Family Medicine

## 2019-07-25 VITALS — BP 133/79 | HR 54 | Ht 71.0 in | Wt 176.8 lb

## 2019-07-25 DIAGNOSIS — I129 Hypertensive chronic kidney disease with stage 1 through stage 4 chronic kidney disease, or unspecified chronic kidney disease: Secondary | ICD-10-CM

## 2019-07-25 DIAGNOSIS — R351 Nocturia: Secondary | ICD-10-CM

## 2019-07-25 DIAGNOSIS — E7849 Other hyperlipidemia: Secondary | ICD-10-CM

## 2019-07-25 DIAGNOSIS — M542 Cervicalgia: Secondary | ICD-10-CM

## 2019-07-25 DIAGNOSIS — N182 Chronic kidney disease, stage 2 (mild): Secondary | ICD-10-CM

## 2019-07-25 DIAGNOSIS — N281 Cyst of kidney, acquired: Secondary | ICD-10-CM

## 2019-07-25 DIAGNOSIS — Z0000 Encounter for general adult medical examination without abnormal findings: Secondary | ICD-10-CM

## 2019-07-25 DIAGNOSIS — R7303 Prediabetes: Secondary | ICD-10-CM

## 2019-07-25 DIAGNOSIS — K30 Functional dyspepsia: Secondary | ICD-10-CM | POA: Diagnosis not present

## 2019-07-25 DIAGNOSIS — R198 Other specified symptoms and signs involving the digestive system and abdomen: Secondary | ICD-10-CM

## 2019-07-25 NOTE — Patient Instructions (Addendum)
Thank you for coming to the office today.  Keep track of BP as you are. Goal < 140/90. Prefer 120-130s. If BP on average is >140 then you can try adding a 3rd pill of Propranolol 20mg  in the afternoon, this med may have worn off in afternoon. Let me know if needed.  Contact us by end of March 2021 or early April 2021 - for REPEAT KIDNEY MRI to follow cyst.  We can place order and someone will call you with apt.  Next actual apt with me will be in June 2021 for physical  DUE for FASTING BLOOD WORK (no food or drink after midnight before the lab appointment, only water or coffee without cream/sugar on the morning of)  SCHEDULE "Lab Only" visit in the morning at the clinic for lab draw in 4 MONTHS   - Make sure Lab Only appointment is at about 1 week before your next appointment, so that results will be available  For Lab Results, once available within 2-3 days of blood draw, you can can log in to MyChart online to view your results and a brief explanation. Also, we can discuss results at next follow-up visit.   Please schedule a Follow-up Appointment to: Return in about 4 months (around 11/22/2019) for Annual Physical.  If you have any other questions or concerns, please feel free to call the office or send a message through [place]. You may also schedule an earlier appointment if necessary.  Additionally, you may be receiving a survey about your experience at our office within a few days to 1 week by e-mail or mail. We value your feedback.  [name], DO [place]

## 2019-07-25 NOTE — Progress Notes (Signed)
Subjective:    [name], male    DOB: [birth year], 69 y.o.   MRN: [id number]  Jesse Figueroa is a 69 y.o. male presenting on 07/25/2019 for Abdominal Pain (intermittent LLQ stomach pain) and Hypertension ( blood pressure averaging 137/80.  Pt reports an high reading one day of 160/100 , but he repeated a few minutes later and the bp dropped to 150/80)   HPI   CHRONIC HTN: Reports doing well. He does report some elevated BP at times, usually in afternoon, ranging 130-140 at times, not always. Checks BP on wrist regularly. Current Meds - Losartan 100mg  daily, Propranolol 20mg  BID for essential tremor and HTN   Reports good compliance, took meds today. Tolerating well, w/o complaints. Lifestyle: - Diet: improving - Exercise: regular Admits rare headache Denies CP, dyspnea, edema, dizziness / lightheadedness  Right Sided Neck Pain - Last visit with me in 2020, for same problem neck, treated with baclofen in past, see prior notes for background information. - Interval update with overall has not taken baclofen regularly and he has changed position of pillow and trying to sleep differently. - Today patient reports overall some improvement. He feels worse at times is some stiff and soreness with neck in AM otherwise it is improved with some lifestyle changes, and massage machine, heat/ice as needed. No longer taking baclofen not helping. Denies any trauma injury, fall other joint pain, bruising redness numbness tingling  Kidney Cyst, non specific lesion R upper kidney 3.5cm Bosniak 29F Cystic lesion. He was advised to repeat MRI in 6 months from 04/2019  Follow-up Abdominal Discomfort / Constipation / Functional GI Symptoms - Last visit with me 2020, same problem, treated with Dicyclomine Bentyl / functional GI interventions and referral to [facility], see prior notes for background information. - Interval update with overall improved, he is still on high fiber diet. He stopped  previous meds Bentyl, Dexilant, Miralax - Today patient reports doing well, now only has intermittent rare "burning" right lower abdomen or back vs flank episodic pain, seems the previous problem has improved on high fiber diet. - he had prior Colonoscopy updated Denies diarrhea dark stool, blood in stool, current abdominal pain  Health Maintenance: UTD Pfizer [phone] vaccine 07/18/19 did well only sore arm for 1 day  Depression screen [facility] 2/9 07/07/2019 03/01/2019 02/08/2019  Decreased Interest 0 0 0  Down, Depressed, Hopeless 0 0 0  PHQ - 2 Score 0 0 0    Social History   Tobacco Use  . Smoking status: Never Smoker  . Smokeless tobacco: Never Used  Substance Use Topics  . Alcohol use: No  . Drug use: No    Review of Systems Per HPI unless specifically indicated above     Objective:    BP 133/79 (BP Location: Right Arm, Patient Position: Sitting, Cuff Size: Normal)   Pulse (!) 54   Ht 5\' 11"  (1.803 m)   Wt 176 lb 12.8 oz (80.2 kg)   BMI 24.66 kg/m   Wt Readings from Last 3 Encounters:  07/25/19 176 lb 12.8 oz (80.2 kg)  04/18/19 173 lb 9.6 oz (78.7 kg)  03/10/19 174 lb 12.8 oz (79.3 kg)    Physical Exam Vitals and nursing note reviewed.  Constitutional:      General: He is not in acute distress.    Appearance: He is well-developed. He is not diaphoretic.     Comments: Well-appearing, comfortable, cooperative  HENT:     Head: Normocephalic and  atraumatic.  Eyes:     General:        Right eye: No discharge.        Left eye: No discharge.     Conjunctiva/sclera: Conjunctivae normal.  Cardiovascular:     Rate and Rhythm: Normal rate.  Pulmonary:     Effort: Pulmonary effort is normal.  Skin:    General: Skin is warm and dry.     Findings: No erythema or rash.  Neurological:     Mental Status: He is alert and oriented to person, place, and time.  Psychiatric:        Behavior: Behavior normal.     Comments: Well groomed, good eye contact, normal speech and  thoughts      I have personally reviewed the radiology report from 04/18/19.  CLINICAL DATA:  Indeterminate hepatic and splenic lesions on recent CT.  EXAM: MRI ABDOMEN WITHOUT AND WITH CONTRAST  TECHNIQUE: Multiplanar multisequence MR imaging of the abdomen was performed both before and after the administration of intravenous contrast.  CONTRAST:  29mL GADAVIST GADOBUTROL 1 MMOL/ML IV SOLN  COMPARISON:  CT on 03/28/2019  FINDINGS: Lower chest: No acute findings.  Hepatobiliary: A 1.5 cm mass is seen in the posterior right hepatic lobe which has classic characteristics of a benign hemangioma. Another focal area of hyperenhancement is seen in the anterior right hepatic lobe which shows lack contrast washout or restricted diffusion, as well as T2 isointensity. This is most consistent with benign focal nodular hyperplasia. Few other tiny sub-cm cysts are noted. Gallbladder is unremarkable. No evidence of biliary ductal dilatation.  Pancreas:  No mass or inflammatory changes.  Spleen: No evidence of splenomegaly. Multiple masses are seen throughout the spleen which show early peripheral nodular contrast enhancement and delayed central enhancement which matches blood pool on all phases, consistent with benign hemangiomas.  Adrenals/Urinary Tract: Several simple cysts are seen in the left kidney. 3.5 x 3.3 cm complex cyst is seen in the pole the right kidney which shows mild contour irregularity in several thin internal septations which show contrast enhancement. No thickened septations or solid component visualized. No evidence of hydronephrosis.  Stomach/Bowel: Visualized portion unremarkable.  Vascular/Lymphatic: No pathologically enlarged lymph nodes identified. No abdominal aortic aneurysm.  Other:  None.  Musculoskeletal:  No suspicious bone lesions identified.  IMPRESSION: Small benign hemangioma and focal nodular hyperplasia in the right hepatic  lobe, corresponding with lesions seen on recent CT.  Multiple benign splenic hemangiomas, corresponding with lesions seen on recent CT.  3.5 cm probably benign Bosniak category 30F cystic lesion in upper pole of right kidney. Recommend continued follow-up by MRI in 6 months. This recommendation follows ACR consensus guidelines: Management of the Incidental Renal Mass on CT: A White Paper of the ACR Incidental Findings Committee. J Am Coll Radiol [phone].   Electronically Signed   By: Marlaine Hind M.D.   On: 04/18/2019 08:42  Results for orders placed or performed during the hospital encounter of 03/28/19  I-STAT creatinine  Result Value Ref Range   Creatinine, Ser 1.20 0.61 - 1.24 mg/dL      Assessment & Plan:   Problem List Items Addressed This Visit    Cyst of right kidney    New incidental finding on 04/2019 MRI 3.5 cm probably benign Bosniak category 30F cystic lesion in upper pole of right kidney. Recommend continued follow-up by MRI in 6 Months (approx 09/2019)  Advised he can notify us in 2 months we will order repeat MRI  to follow up on this.      Benign hypertension with CKD (chronic kidney disease), stage II - Primary    Controlled HTN - still has some occasional elevated readings in high 130s to low 140s in afternoon - Home BP readings appropriate  Complication with CKD-II    Plan:  1. Continue current BP regimen - Losartan 100mg  daily, Propranolol 20mg  BID - Advised that one option if elevated BP in afternoon is that his propranolol likely not lasting 24 hours if he is taking only BID, likely low dose in body in afternoon he could inc to TID or add extra pill in afternoon but he declines today, can try this if he needs to in future 2. Encourage improved lifestyle - low sodium diet, regular exercise 3. Continue monitor BP outside office, bring readings to next visit, if persistently >140/90 or new symptoms notify office sooner       Other Visit  Diagnoses    Neck pain on right side     Previous vs recurrent muscle strain, soreness now improving with lifestyle changes Off Baclofen    Functional GI symptoms      Improved overall Followed by [facility] on high fiber diet Failed dexilant, bentyl, miralax Had prior GI work up negative      No orders of the defined types were placed in this encounter.   Follow up plan: Return in about 4 months (around 11/22/2019) for Annual Physical.  Future labs ordered for 11/16/19  He will notify us in 2 months to place order for repeat MRI for R Kidney Cyst f/u  Jesse Figueroa, [facility] 07/25/2019, 9:03 AM

## 2019-07-25 NOTE — Assessment & Plan Note (Signed)
New incidental finding on 04/2019 MRI 3.5 cm probably benign Bosniak category 56F cystic lesion in upper pole of right kidney. Recommend continued follow-up by MRI in 6 Months (approx 09/2019)  Advised he can notify us in 2 months we will order repeat MRI to follow up on this.

## 2019-07-25 NOTE — Assessment & Plan Note (Signed)
Controlled HTN - still has some occasional elevated readings in high 130s to low 140s in afternoon - Home BP readings appropriate  Complication with CKD-II    Plan:  1. Continue current BP regimen - Losartan 100mg  daily, Propranolol 20mg  BID - Advised that one option if elevated BP in afternoon is that his propranolol likely not lasting 24 hours if he is taking only BID, likely low dose in body in afternoon he could inc to TID or add extra pill in afternoon but he declines today, can try this if he needs to in future 2. Encourage improved lifestyle - low sodium diet, regular exercise 3. Continue monitor BP outside office, bring readings to next visit, if persistently >140/90 or new symptoms notify office sooner

## 2019-08-11 ENCOUNTER — Ambulatory Visit (INDEPENDENT_AMBULATORY_CARE_PROVIDER_SITE_OTHER): Payer: Medicare HMO | Admitting: Family Medicine

## 2019-08-11 ENCOUNTER — Other Ambulatory Visit: Payer: Self-pay

## 2019-08-11 ENCOUNTER — Encounter: Payer: Self-pay | Admitting: Family Medicine

## 2019-08-11 VITALS — BP 135/80 | HR 51 | Temp 97.5°F | Resp 16 | Ht 71.0 in | Wt 177.0 lb

## 2019-08-11 DIAGNOSIS — K6289 Other specified diseases of anus and rectum: Secondary | ICD-10-CM

## 2019-08-11 DIAGNOSIS — K644 Residual hemorrhoidal skin tags: Secondary | ICD-10-CM

## 2019-08-11 NOTE — Patient Instructions (Addendum)
Thank you for coming to the office today.  You have a non inflamed external hemorrhoid, 12 o clock position I can swell if you have flare of constipation or other strain and can cause burning and pain You may experience worsening pain and bleeding with bright red blood if the hemorrhoid develops a superficial blood clot.  If flare can use topical preparation H or witch hazel wipes and cream OTC as needed for 1-2 week if flared up - continue the warm bathtub soak 1-2 times daily for next week if you can, or can try the [facility] for just your bottom - Try to stay well hydrated, avoid constipation and straining, eat a high fiber diet  For Constipation (less frequent bowel movement that can be hard dry or involve straining).  Recommend trying OTC Miralax 17g = 1 capful in large glass water once daily for now, try several days to see if working, goal is soft stool or BM 1-2 times daily, if too loose then reduce dose or try every other day. If not effective may need to increase it to 2 doses at once in AM or may do 1 in morning and 1 in afternoon/evening  - This medicine is very safe and can be used often without any problem and will not make you dehydrated. It is good for use on AS NEEDED BASIS or even MAINTENANCE therapy for longer term for several days to weeks at a time to help regulate bowel movements  Other more natural remedies or preventative treatment: - Increase hydration with water - Increase fiber in diet (high fiber foods = vegetables, leafy greens, oats/grains) - May take OTC Fiber supplement (metamucil powder or pill/gummy) - May try OTC Probiotic   If you get significant worsening pain, rectal bleeding, or not responding to treatment, please notify our office and we will anticipate on an urgent referral to General Surgery office (usually [facility]) as you may need an office procedure to resolve hemorrhoidal tissue, this is most successful if it is early in the  course within 24-72 hours of acute worsening pain and bleeding.   Please schedule a Follow-up Appointment to: Return if symptoms worsen or fail to improve.  If you have any other questions or concerns, please feel free to call the office or send a message through [place]. You may also schedule an earlier appointment if necessary.  Additionally, you may be receiving a survey about your experience at our office within a few days to 1 week by e-mail or mail. We value your feedback.  [name], DO [place]

## 2019-08-11 NOTE — Progress Notes (Signed)
Subjective:    [name], male    DOB: Jun 15, 1951, 69 y.o.   MRN: [id number]  Jesse Figueroa is a 69 y.o. male presenting on 08/11/2019 for Hemorrhoids   HPI   Hemorrhoid, external / Rectal Pain Reports symptoms started within 1 week with anal itching and burning, then worse 2-3 days later had BM with some rectal pain and some small amount of bright red blood on toilet paper, not in toilet. Not mixed with stool. Prior history episodic loose stool and more recently harder dry stool. Admits some straining. - History of last colonoscopy 02/05/16 Dr Gustavo Lah, did not identify hemorrhoids Did not try any medicine or topical for this yet. Has not had further bleeding since. Pain seems improved now. No prior dx hemorrhoid  Depression screen [facility] 2/9 08/11/2019 07/07/2019 03/01/2019  Decreased Interest 0 0 0  Down, Depressed, Hopeless 0 0 0  PHQ - 2 Score 0 0 0    Social History   Tobacco Use  . Smoking status: Never Smoker  . Smokeless tobacco: Never Used  Substance Use Topics  . Alcohol use: No  . Drug use: No    Review of Systems Per HPI unless specifically indicated above     Objective:    BP 135/80   Pulse (!) 51   Temp (!) 97.5 F (36.4 C) (Oral)   Resp 16   Ht 5\' 11"  (1.803 m)   Wt 177 lb (80.3 kg)   BMI 24.69 kg/m   Wt Readings from Last 3 Encounters:  08/11/19 177 lb (80.3 kg)  07/25/19 176 lb 12.8 oz (80.2 kg)  04/18/19 173 lb 9.6 oz (78.7 kg)    Physical Exam Vitals and nursing note reviewed.  Constitutional:      General: He is not in acute distress.    Appearance: He is well-developed. He is not diaphoretic.     Comments: Well-appearing, comfortable, cooperative  HENT:     Head: Normocephalic and atraumatic.  Eyes:     General:        Right eye: No discharge.        Left eye: No discharge.     Conjunctiva/sclera: Conjunctivae normal.  Cardiovascular:     Rate and Rhythm: Normal rate.  Pulmonary:     Effort: Pulmonary effort is  normal.  Genitourinary:    Comments: Recta: external exam with moderate non inflamed external hemorrhoid 12 o clock, and area of prior likely anal fissure. No active bleeding No DRE today Skin:    General: Skin is warm and dry.     Findings: No erythema or rash.  Neurological:     Mental Status: He is alert and oriented to person, place, and time.  Psychiatric:        Behavior: Behavior normal.     Comments: Well groomed, good eye contact, normal speech and thoughts    Results for orders placed or performed during the hospital encounter of 03/28/19  I-STAT creatinine  Result Value Ref Range   Creatinine, Ser 1.20 0.61 - 1.24 mg/dL      Assessment & Plan:   Problem List Items Addressed This Visit    None    Visit Diagnoses    External hemorrhoid    -  Primary   Rectal pain          Acute non-inflamed external hemorrhoid (12 o clock position). Does not appear thrombosed, without active bleeding. Possible anal fissure in this area, healed No sign  of erythema or cellulitis. - Inadequate conservative therapy   Plan: 1. Conservative care, reviewed dx hemorrhoid and management - Trial OTC Preparation H cream or wipes / Witch Hazel PRN 2. Start Sitz Baths or warm bathtub soaks to help resolve flare 3. Avoid constipation and straining, recommend high fiber diet, improve hydration 4. May take NSAID PRN 5. Caution with prolonged sitting 6. Reviewed return criteria if not improving, also advised that if significant worsening given location and size of hemorrhoidal tissue, may need General Surgery evaluation and management   No orders of the defined types were placed in this encounter.     Follow up plan: Return if symptoms worsen or fail to improve.   [name], [facility] 08/11/2019, 8:48 AM

## 2019-08-29 ENCOUNTER — Telehealth: Payer: Self-pay | Admitting: General Practice

## 2019-08-29 ENCOUNTER — Ambulatory Visit (INDEPENDENT_AMBULATORY_CARE_PROVIDER_SITE_OTHER): Payer: Medicare HMO | Admitting: General Practice

## 2019-08-29 DIAGNOSIS — I129 Hypertensive chronic kidney disease with stage 1 through stage 4 chronic kidney disease, or unspecified chronic kidney disease: Secondary | ICD-10-CM

## 2019-08-29 DIAGNOSIS — N182 Chronic kidney disease, stage 2 (mild): Secondary | ICD-10-CM

## 2019-08-29 DIAGNOSIS — E7849 Other hyperlipidemia: Secondary | ICD-10-CM | POA: Diagnosis not present

## 2019-08-29 NOTE — Patient Instructions (Signed)
   Visit Information  Goals Addressed            This Visit's Progress   . RNCM: I do want to know how to better manage my hypertension/also I am still having issues with my stomach       Current Barriers:  . Chronic Disease Management support, education, and care coordination needs related to HTN, HLD, CKD Stage 2, and stomach issues . COVID Vaccination concerns/questions- the patient has received both vaccines for COVID and feels safer  Clinical Goal(s) related to: HTN, HLD, CKD Stage 2, and stomach issues COVID Vaccination concerns/Questions Over the next 90 days, patient will:  . Work with the care management team to address educational, disease management, and care coordination needs  . Begin or continue self health monitoring activities as directed today Measure and record blood pressure 5 to 6 times per week and record  and monitor for factors that trigger issues related to stomach issues and concerns . Call provider office for new or worsened signs and symptoms Blood pressure findings outside established parameters, New or worsened symptom related to stomach issues, and changes in chronic disease process . Call care management team with questions or concerns . Verbalize basic understanding of patient centered plan of care established today . Receive desired COVID19 vaccination   Interventions related to HTN, HLD, CKD Stage 2, and stomach issues . Evaluation of current treatment plans and patient's adherence to plan as established by provider . Assessed patient understanding of disease states . Assessed patient's education and care coordination needs, the patient declines assistance of the pharmacist and social worker at this time; however knows they are available to assist with any new concerns/help . Provided disease specific education to patient to assist with managing his HTN, and other chronic medical conditions. . Reviewed heart healthy diet. The patient states he is  watching what he eats and his blood pressures are ranging around 135/80.  The patient denies any new concerns at this time.  . Assessed the patients stomach issues. The patient states he is not having problems like he was before. He is thankful for resolution at this time. Nash Dimmer with appropriate clinical care team members regarding patient needs . Assessed upcoming appointments with pcp: 11-22-2019 with Dr. Parks Ranger.   Patient Self Care Activities related to HTN, HLD, CKD Stage 2, and stomach issues . Patient is unable to independently self-manage chronic health conditions  Please see past updates related to this goal by clicking on the "Past Updates" button in the selected goal         Patient verbalizes understanding of instructions provided today.   The care management team will reach out to the patient again over the next 60 days.   Noreene Larsson RN, MSN, [place] Mobile: [phone]

## 2019-08-29 NOTE — Chronic Care Management (AMB) (Signed)
Chronic Care Management   Follow Up Note   08/29/2019 Name: Jesse Figueroa MRN: [id number] DOB: September 21, 1950  Referred by: Olin Hauser, DO Reason for referral : Chronic Care Management (F/U on HTN.HLD/CKD2 and stomach issues)   Jesse Figueroa is a 69 y.o. year old male who is a primary care patient of Olin Hauser, DO. The CCM team was consulted for assistance with chronic disease management and care coordination needs.    Review of patient status, including review of consultants reports, relevant laboratory and other test results, and collaboration with appropriate care team members and the patient's provider was performed as part of comprehensive patient evaluation and provision of chronic care management services.    SDOH (Social Determinants of Health) assessments performed: No See Care Plan activities for detailed interventions related to [facility])     Outpatient Encounter Medications as of 08/29/2019  Medication Sig   aspirin 81 MG chewable tablet Chew by mouth.   atorvastatin (LIPITOR) 10 MG tablet Take 1 tablet (10 mg total) by mouth daily at 6 PM.   fluticasone (FLONASE) 50 MCG/ACT nasal spray USE 2 SPRAYS IN BOTH NOSTRILS DAILY. USE FOR 4 TO 6 WEEKS THEN STOP AND USE SEASONALLY OR AS NEEDED.   losartan (COZAAR) 100 MG tablet TAKE 1 TABLET EVERY DAY   propranolol (INDERAL) 20 MG tablet TAKE 1 TABLET TWICE DAILY   No facility-administered encounter medications on file as of 08/29/2019.     Objective:   Goals Addressed            This Visit's Progress    RNCM: I do want to know how to better manage my hypertension/also I am still having issues with my stomach       Current Barriers:   Chronic Disease Management support, education, and care coordination needs related to HTN, HLD, CKD Stage 2, and stomach issues  COVID Vaccination concerns/questions- the patient has received both vaccines for COVID and feels safer  Clinical Goal(s) related to:  HTN, HLD, CKD Stage 2, and stomach issues COVID Vaccination concerns/Questions Over the next 90 days, patient will:   Work with the care management team to address educational, disease management, and care coordination needs   Begin or continue self health monitoring activities as directed today Measure and record blood pressure 5 to 6 times per week and record  and monitor for factors that trigger issues related to stomach issues and concerns  Call provider office for new or worsened signs and symptoms Blood pressure findings outside established parameters, New or worsened symptom related to stomach issues, and changes in chronic disease process  Call care management team with questions or concerns  Verbalize basic understanding of patient centered plan of care established today  Receive desired COVID19 vaccination   Interventions related to HTN, HLD, CKD Stage 2, and stomach issues  Evaluation of current treatment plans and patient's adherence to plan as established by provider  Assessed patient understanding of disease states  Assessed patient's education and care coordination needs, the patient declines assistance of the pharmacist and social worker at this time; however knows they are available to assist with any new concerns/help  Provided disease specific education to patient to assist with managing his HTN, and other chronic medical conditions.  Reviewed heart healthy diet. The patient states he is watching what he eats and his blood pressures are ranging around 135/80.  The patient denies any new concerns at this time.   Assessed the patients stomach issues. The  patient states he is not having problems like he was before. He is thankful for resolution at this time.  Collaborated with appropriate clinical care team members regarding patient needs  Assessed upcoming appointments with pcp: 11-22-2019 with Dr. Parks Ranger.   Patient Self Care Activities related to HTN, HLD, CKD  Stage 2, and stomach issues  Patient is unable to independently self-manage chronic health conditions  Please see past updates related to this goal by clicking on the "Past Updates" button in the selected goal          Plan:   The care management team will reach out to the patient again over the next 60 to 90 days.    Noreene Larsson RN, MSN, [place] Mobile: [phone]

## 2019-10-10 DIAGNOSIS — H526 Other disorders of refraction: Secondary | ICD-10-CM | POA: Diagnosis not present

## 2019-10-10 DIAGNOSIS — H109 Unspecified conjunctivitis: Secondary | ICD-10-CM | POA: Diagnosis not present

## 2019-10-19 ENCOUNTER — Other Ambulatory Visit: Payer: Self-pay | Admitting: Family Medicine

## 2019-10-19 DIAGNOSIS — I10 Essential (primary) hypertension: Secondary | ICD-10-CM

## 2019-10-25 DIAGNOSIS — H527 Unspecified disorder of refraction: Secondary | ICD-10-CM | POA: Diagnosis not present

## 2019-10-25 DIAGNOSIS — H2513 Age-related nuclear cataract, bilateral: Secondary | ICD-10-CM | POA: Diagnosis not present

## 2019-10-25 DIAGNOSIS — E119 Type 2 diabetes mellitus without complications: Secondary | ICD-10-CM | POA: Diagnosis not present

## 2019-10-25 DIAGNOSIS — Z794 Long term (current) use of insulin: Secondary | ICD-10-CM | POA: Diagnosis not present

## 2019-10-25 DIAGNOSIS — H04123 Dry eye syndrome of bilateral lacrimal glands: Secondary | ICD-10-CM | POA: Diagnosis not present

## 2019-10-25 DIAGNOSIS — H40023 Open angle with borderline findings, high risk, bilateral: Secondary | ICD-10-CM | POA: Diagnosis not present

## 2019-10-27 ENCOUNTER — Telehealth: Payer: Self-pay | Admitting: General Practice

## 2019-10-27 ENCOUNTER — Ambulatory Visit (INDEPENDENT_AMBULATORY_CARE_PROVIDER_SITE_OTHER): Payer: Medicare HMO | Admitting: General Practice

## 2019-10-27 DIAGNOSIS — E7849 Other hyperlipidemia: Secondary | ICD-10-CM

## 2019-10-27 DIAGNOSIS — I129 Hypertensive chronic kidney disease with stage 1 through stage 4 chronic kidney disease, or unspecified chronic kidney disease: Secondary | ICD-10-CM

## 2019-10-27 DIAGNOSIS — N182 Chronic kidney disease, stage 2 (mild): Secondary | ICD-10-CM

## 2019-10-27 NOTE — Chronic Care Management (AMB) (Signed)
Chronic Care Management   Follow Up Note   10/27/2019 Name: Jesse Figueroa MRN: [id number] DOB: 07/14/1950  Referred by: Olin Hauser, DO Reason for referral : Chronic Care Management (Follow up call: HTN/HLD/CKD2)   DONNIVAN KEKAHUNA is a 69 y.o. year old male who is a primary care patient of Olin Hauser, DO. The CCM team was consulted for assistance with chronic disease management and care coordination needs.    Review of patient status, including review of consultants reports, relevant laboratory and other test results, and collaboration with appropriate care team members and the patient's provider was performed as part of comprehensive patient evaluation and provision of chronic care management services.    SDOH (Social Determinants of Health) assessments performed: Yes- no deficits in [place] activities for detailed interventions related to [facility])     Outpatient Encounter Medications as of 10/27/2019  Medication Sig  . aspirin 81 MG chewable tablet Chew by mouth.  Marland Kitchen atorvastatin (LIPITOR) 10 MG tablet Take 1 tablet (10 mg total) by mouth daily at 6 PM.  . fluticasone (FLONASE) 50 MCG/ACT nasal spray USE 2 SPRAYS IN BOTH NOSTRILS DAILY. USE FOR 4 TO 6 WEEKS THEN STOP AND USE SEASONALLY OR AS NEEDED.  Marland Kitchen losartan (COZAAR) 100 MG tablet TAKE 1 TABLET EVERY DAY  . propranolol (INDERAL) 20 MG tablet TAKE 1 TABLET TWICE DAILY   No facility-administered encounter medications on file as of 10/27/2019.     Objective:  BP Readings from Last 3 Encounters:  08/11/19 135/80  07/25/19 133/79  07/07/19 137/80    Goals Addressed            This Visit's Progress   . RNCM: I do want to know how to better manage my hypertension       Current Barriers:  . Chronic Disease Management support, education, and care coordination needs related to HTN, HLD, CKD Stage 2, and stomach issues . COVID Vaccination concerns/questions- the patient has received both vaccines  for COVID and feels safer  Clinical Goal(s) related to: HTN, HLD, CKD Stage 2, and stomach issues COVID Vaccination concerns/Questions Over the next 120 days, patient will:  . Work with the care management team to address educational, disease management, and care coordination needs  . Begin or continue self health monitoring activities as directed today Measure and record blood pressure 5 to 6 times per week and record  and monitor for factors that trigger issues related to stomach issues and concerns . Call provider office for new or worsened signs and symptoms Blood pressure findings outside established parameters, New or worsened symptom related to stomach issues, and changes in chronic disease process . Call care management team with questions or concerns . Verbalize basic understanding of patient centered plan of care established today . Receive desired COVID19 vaccination   Interventions related to HTN, HLD, CKD Stage 2, and stomach issues . Evaluation of current treatment plans and patient's adherence to plan as established by provider.  The patient verbalized he is managing his health well at this time and has not new concerns . Assessed patient understanding of disease states. Has good understanding of his conditions and how to effectively manage . Assessed patient's education and care coordination needs, the patient declines assistance of the pharmacist and social worker at this time; however knows they are available to assist with any new concerns/help . Provided disease specific education to patient to assist with managing his HTN, and other chronic medical conditions.  Review of a heart healthy diet and regular exercise to maintain health and well being . Reviewed heart healthy diet. The patient states he is watching what he eats and his blood pressures are ranging around A999333 to A999333 systolic and around 80 diastolic.  The patient denies any new concerns at this time.  . Assessed the  patients stomach issues. The patient states he is not having problems like he was before. He is thankful for resolution at this time. Resolved and not having any new issues with his stomach . Collaborated with appropriate clinical care team members regarding patient needs.  No new needs at this time.  . Assessed upcoming appointments with pcp: 11-22-2019 with Dr. Parks Ranger. The patient will have lab work on 11-16-2019  Patient Self Care Activities related to HTN, HLD, CKD Stage 2, and stomach issues . Patient is unable to independently self-manage chronic health conditions  Please see past updates related to this goal by clicking on the "Past Updates" button in the selected goal          Plan:   The care management team will reach out to the patient again over the next 60 to 90 days.    Noreene Larsson RN, MSN, [place] Mobile: [phone]

## 2019-10-27 NOTE — Patient Instructions (Signed)
Visit Information  Goals Addressed            This Visit's Progress   . RNCM: I do want to know how to better manage my hypertension       Current Barriers:  . Chronic Disease Management support, education, and care coordination needs related to HTN, HLD, CKD Stage 2, and stomach issues . COVID Vaccination concerns/questions- the patient has received both vaccines for COVID and feels safer  Clinical Goal(s) related to: HTN, HLD, CKD Stage 2, and stomach issues COVID Vaccination concerns/Questions Over the next 120 days, patient will:  . Work with the care management team to address educational, disease management, and care coordination needs  . Begin or continue self health monitoring activities as directed today Measure and record blood pressure 5 to 6 times per week and record  and monitor for factors that trigger issues related to stomach issues and concerns . Call provider office for new or worsened signs and symptoms Blood pressure findings outside established parameters, New or worsened symptom related to stomach issues, and changes in chronic disease process . Call care management team with questions or concerns . Verbalize basic understanding of patient centered plan of care established today . Receive desired COVID19 vaccination   Interventions related to HTN, HLD, CKD Stage 2, and stomach issues . Evaluation of current treatment plans and patient's adherence to plan as established by provider.  The patient verbalized he is managing his health well at this time and has not new concerns . Assessed patient understanding of disease states. Has good understanding of his conditions and how to effectively manage . Assessed patient's education and care coordination needs, the patient declines assistance of the pharmacist and social worker at this time; however knows they are available to assist with any new concerns/help . Provided disease specific education to patient to assist with  managing his HTN, and other chronic medical conditions. Review of a heart healthy diet and regular exercise to maintain health and well being . Reviewed heart healthy diet. The patient states he is watching what he eats and his blood pressures are ranging around A999333 to A999333 systolic and around 80 diastolic.  The patient denies any new concerns at this time.  . Assessed the patients stomach issues. The patient states he is not having problems like he was before. He is thankful for resolution at this time. Resolved and not having any new issues with his stomach . Collaborated with appropriate clinical care team members regarding patient needs.  No new needs at this time.  . Assessed upcoming appointments with pcp: 11-22-2019 with Dr. Parks Ranger. The patient will have lab work on 11-16-2019  Patient Self Care Activities related to HTN, HLD, CKD Stage 2, and stomach issues . Patient is unable to independently self-manage chronic health conditions  Please see past updates related to this goal by clicking on the "Past Updates" button in the selected goal         Patient verbalizes understanding of instructions provided today.   The care management team will reach out to the patient again over the next 60 to 90 days.   Noreene Larsson RN, MSN, [place] Mobile: [phone]

## 2019-11-15 ENCOUNTER — Other Ambulatory Visit: Payer: Medicare HMO

## 2019-11-16 ENCOUNTER — Other Ambulatory Visit: Payer: Self-pay

## 2019-11-16 ENCOUNTER — Other Ambulatory Visit: Payer: Medicare HMO

## 2019-11-16 DIAGNOSIS — R351 Nocturia: Secondary | ICD-10-CM

## 2019-11-16 DIAGNOSIS — N182 Chronic kidney disease, stage 2 (mild): Secondary | ICD-10-CM

## 2019-11-16 DIAGNOSIS — I129 Hypertensive chronic kidney disease with stage 1 through stage 4 chronic kidney disease, or unspecified chronic kidney disease: Secondary | ICD-10-CM

## 2019-11-16 DIAGNOSIS — E7849 Other hyperlipidemia: Secondary | ICD-10-CM | POA: Diagnosis not present

## 2019-11-16 DIAGNOSIS — Z0000 Encounter for general adult medical examination without abnormal findings: Secondary | ICD-10-CM | POA: Diagnosis not present

## 2019-11-16 DIAGNOSIS — R7303 Prediabetes: Secondary | ICD-10-CM | POA: Diagnosis not present

## 2019-11-17 LAB — COMPLETE METABOLIC PANEL WITH GFR
AG Ratio: 1.6 (calc) (ref 1.0–2.5)
ALT: 22 U/L (ref 9–46)
AST: 22 U/L (ref 10–35)
Albumin: 4.2 g/dL (ref 3.6–5.1)
Alkaline phosphatase (APISO): 99 U/L (ref 35–144)
BUN: 17 mg/dL (ref 7–25)
CO2: 31 mmol/L (ref 20–32)
Calcium: 9.5 mg/dL (ref 8.6–10.3)
Chloride: 104 mmol/L (ref 98–110)
Creat: 1.17 mg/dL (ref 0.70–1.18)
GFR, Est African American: 72 mL/min/{1.73_m2} (ref >=60)
GFR, Est Non African American: 62 mL/min/{1.73_m2} (ref >=60)
Globulin: 2.6 g/dL (calc) (ref 1.9–3.7)
Glucose, Bld: 105 mg/dL — ABNORMAL HIGH (ref 65–99)
Potassium: 4.5 mmol/L (ref 3.5–5.3)
Sodium: 141 mmol/L (ref 135–146)
Total Bilirubin: 0.5 mg/dL (ref 0.2–1.2)
Total Protein: 6.8 g/dL (ref 6.1–8.1)

## 2019-11-17 LAB — CBC WITH DIFFERENTIAL/PLATELET
Absolute Monocytes: 455 cells/uL (ref 200–950)
Basophils Absolute: 29 cells/uL (ref 0–200)
Basophils Relative: 0.7 %
Eosinophils Absolute: 201 cells/uL (ref 15–500)
Eosinophils Relative: 4.9 %
HCT: 44.8 % (ref 38.5–50.0)
Hemoglobin: 14.2 g/dL (ref 13.2–17.1)
Lymphs Abs: 1989 cells/uL (ref 850–3900)
MCH: 27.6 pg (ref 27.0–33.0)
MCHC: 31.7 g/dL — ABNORMAL LOW (ref 32.0–36.0)
MCV: 87.2 fL (ref 80.0–100.0)
MPV: 11.1 fL (ref 7.5–12.5)
Monocytes Relative: 11.1 %
Neutro Abs: 1427 cells/uL — ABNORMAL LOW (ref 1500–7800)
Neutrophils Relative %: 34.8 %
Platelets: 292 10*3/uL (ref 140–400)
RBC: 5.14 10*6/uL (ref 4.20–5.80)
RDW: 13.1 % (ref 11.0–15.0)
Total Lymphocyte: 48.5 %
WBC: 4.1 10*3/uL (ref 3.8–10.8)

## 2019-11-17 LAB — LIPID PANEL
Cholesterol: 135 mg/dL (ref <200)
HDL: 49 mg/dL (ref >=40)
LDL Cholesterol (Calc): 72 mg/dL (calc)
Non-HDL Cholesterol (Calc): 86 mg/dL (calc) (ref <130)
Total CHOL/HDL Ratio: 2.8 (calc) (ref <5.0)
Triglycerides: 59 mg/dL (ref <150)

## 2019-11-17 LAB — HEMOGLOBIN A1C
Hgb A1c MFr Bld: 6.1 % of total Hgb — ABNORMAL HIGH (ref <5.7)
Mean Plasma Glucose: 128 (calc)
eAG (mmol/L): 7.1 (calc)

## 2019-11-17 LAB — PSA: PSA: 0.9 ng/mL (ref <=4.0)

## 2019-11-17 LAB — TSH: TSH: 3.15 mIU/L (ref 0.40–4.50)

## 2019-11-22 ENCOUNTER — Encounter: Payer: Medicare HMO | Admitting: Family Medicine

## 2019-11-24 ENCOUNTER — Telehealth: Payer: Self-pay

## 2019-11-25 ENCOUNTER — Other Ambulatory Visit: Payer: Self-pay

## 2019-11-25 ENCOUNTER — Encounter: Payer: Self-pay | Admitting: Family Medicine

## 2019-11-25 ENCOUNTER — Ambulatory Visit (INDEPENDENT_AMBULATORY_CARE_PROVIDER_SITE_OTHER): Payer: Medicare HMO | Admitting: Family Medicine

## 2019-11-25 VITALS — BP 125/79 | HR 48 | Temp 97.5°F | Resp 16 | Ht 71.0 in | Wt 170.0 lb

## 2019-11-25 DIAGNOSIS — R7303 Prediabetes: Secondary | ICD-10-CM | POA: Diagnosis not present

## 2019-11-25 DIAGNOSIS — I129 Hypertensive chronic kidney disease with stage 1 through stage 4 chronic kidney disease, or unspecified chronic kidney disease: Secondary | ICD-10-CM

## 2019-11-25 DIAGNOSIS — E7849 Other hyperlipidemia: Secondary | ICD-10-CM

## 2019-11-25 DIAGNOSIS — Z0000 Encounter for general adult medical examination without abnormal findings: Secondary | ICD-10-CM

## 2019-11-25 DIAGNOSIS — N182 Chronic kidney disease, stage 2 (mild): Secondary | ICD-10-CM | POA: Diagnosis not present

## 2019-11-25 DIAGNOSIS — N281 Cyst of kidney, acquired: Secondary | ICD-10-CM

## 2019-11-25 NOTE — Patient Instructions (Addendum)
Thank you for coming to the office today.  Keep up the great work.  For Kidney cyst. Someone will call you to schedule another MRI anytime once we have it ordered. Will notify you of results after.   You have symptoms of Vertigo (Benign Paroxysmal Positional Vertigo) - This is commonly caused by inner ear fluid imbalance, sometimes can be worsened by allergies and sinus symptoms, otherwise it can occur randomly sometimes and we may never discover the exact cause. - To treat this, try the Epley Manuever (see diagrams/instructions below) at home up to 3 times a day for 1-2 weeks or until symptoms resolve - You may take OTC Dramamine or Meclizine as needed up to 3 times a day for dizziness, this will not cure symptoms but may help. Caution may make you drowsy.  If you develop significant worsening episode with vertigo that does not improve and you get severe headache, loss of vision, arm or leg weakness, slurred speech, or other concerning symptoms please seek immediate medical attention at Emergency Department.  See the next page for images describing the Epley Manuever.     ----------------------------------------------------------------------------------------------------------------------       DUE for NON FASTING BLOOD WORK  SCHEDULE "Lab Only" visit in the morning at the clinic for lab draw in  6 MONTHS   - Make sure Lab Only appointment is at about 1 week before your next appointment, so that results will be available  For Lab Results, once available within 2-3 days of blood draw, you can can log in to MyChart online to view your results and a brief explanation. Also, we can discuss results at next follow-up visit.    Please schedule a Follow-up Appointment to: Return in about 6 months (around 05/26/2020) for 6 month follow-up PreDM, HTN .  If you have any other questions or concerns, please feel free to call the office or send a message through [place]. You may also  schedule an earlier appointment if necessary.  Additionally, you may be receiving a survey about your experience at our office within a few days to 1 week by e-mail or mail. We value your feedback.  [name], DO [place]

## 2019-11-25 NOTE — Progress Notes (Signed)
Subjective:    [name], male    DOB: [birth year], 69 y.o.   MRN: [id number]  Jesse Figueroa is a 69 y.o. male presenting on 11/25/2019 for Annual Exam   HPI   Here for Annual Physical and Lab Review  Elevated A1c /Pre-Diabetes Improved diet and more water A1c down to 6.1, previously 6.3 to 6.4 CBGs:Not check CBG at home Meds:None (never on) Currently on ARB - Family history of DM, Mother Denies hypoglycemia, polyuria, polydipsia  CHRONIC HTN CKD-II Improved creatinine Reports doing well. He does report some elevated BP at times, usually in afternoon, ranging 130-140 at times, not always. Checks BP on wrist regularly. Current Meds - Losartan 100mg  daily, Propranolol 20mg  BID for essential tremor and HTN   Reports good compliance, took meds today. Tolerating well, w/o complaints. Lifestyle: - Diet: improving - Exercise: regular Admits rare headache Denies CP, dyspnea, edema, dizziness / lightheadedness  Kidney Cyst, non specific lesion R upper kidney 3.5cm Bosniak 39F Cystic lesion. He was advised to repeat MRI in 6 months from 04/2019 Asymptomatic. Asking about follow-up MRI  HYPERLIPIDEMIA: Last lipid panel 11/2019, normalized - Currently taking Atorvastatin 10mg , tolerating well without side effects or myalgias Lifestyle - Diet: limited - Exercise: improved activity exercise now  Additional history  Yesterday first episode, opened eyes room was spinning. Closed eyes and it improved. Describes vertigo  PMH Essential Tremor Isolated to Right upper extremity, describes only provoked intention tremor  Health Maintenance: Last colonoscopy 2017, next due in 5 years from that date, approx 2022   Depression screen [facility] 2/9 11/25/2019 10/27/2019 08/11/2019  Decreased Interest 0 0 0  Down, Depressed, Hopeless 0 0 0  PHQ - 2 Score 0 0 0    Past Medical History:  Diagnosis Date  . Arthritis of hand   . Benign hypertension with CKD (chronic kidney  disease), stage II   . CKD (chronic kidney disease), stage II   . Hyperlipidemia   . Hypertension   . Tremor    Past Surgical History:  Procedure Laterality Date  . COLONOSCOPY WITH PROPOFOL N/A 02/05/2016   Procedure: COLONOSCOPY WITH PROPOFOL;  Surgeon: Lollie Sails, MD;  [facility]COPY;  Service: Endoscopy;  Laterality: N/A;  . EYE SURGERY  Lasic   Social History   Socioeconomic History  . Marital status: Married    Spouse name: Not on file  . Number of children: Not on file  . Years of education: Not on file  . Highest education level: Not on file  Occupational History  . Occupation: Psychologist, clinical    Comment: Pension scheme manager  Tobacco Use  . Smoking status: Never Smoker  . Smokeless tobacco: Never Used  Vaping Use  . Vaping Use: Never used  Substance and Sexual Activity  . Alcohol use: No  . Drug use: No  . Sexual activity: Yes  Other Topics Concern  . Not on file  Social History Narrative   Track coach 5 days a week    Social Determinants of Health   Financial Resource Strain: Low Risk   . Difficulty of Paying Living Expenses: Not hard at all  Food Insecurity: No Food Insecurity  . Worried About Charity fundraiser in the Last Year: Never true  . Ran Out of Food in the Last Year: Never true  Transportation Needs: No Transportation Needs  . Lack of Transportation (Medical): No  . Lack of Transportation (Non-Medical): No  Physical Activity:  Sufficiently Active  . Days of Exercise per Week: 7 days  . Minutes of Exercise per Session: 30 min  Stress:   . Feeling of Stress :   Social Connections: Moderately Integrated  . Frequency of Communication with Friends and Family: More than three times a week  . Frequency of Social Gatherings with Friends and Family: More than three times a week  . Attends Religious Services: Never  . Active Member of Clubs or Organizations: Yes  . Attends Archivist Meetings: More than 4 times  per year  . Marital Status: Married  Human resources officer Violence: Not At Risk  . Fear of Current or Ex-Partner: No  . Emotionally Abused: No  . Physically Abused: No  . Sexually Abused: No   Family History  Problem Relation Age of Onset  . Heart disease Mother   . Hypertension Mother   . Diabetes Mother   . Cancer Father    Current Outpatient Medications on File Prior to Visit  Medication Sig  . aspirin 81 MG chewable tablet Chew by mouth.  Marland Kitchen atorvastatin (LIPITOR) 10 MG tablet Take 1 tablet (10 mg total) by mouth daily at 6 PM.  . fluticasone (FLONASE) 50 MCG/ACT nasal spray USE 2 SPRAYS IN BOTH NOSTRILS DAILY. USE FOR 4 TO 6 WEEKS THEN STOP AND USE SEASONALLY OR AS NEEDED.  Marland Kitchen losartan (COZAAR) 100 MG tablet TAKE 1 TABLET EVERY DAY  . propranolol (INDERAL) 20 MG tablet TAKE 1 TABLET TWICE DAILY   No current facility-administered medications on file prior to visit.    Review of Systems  Constitutional: Negative for activity change, appetite change, chills, diaphoresis, fatigue and fever.  HENT: Negative for congestion and hearing loss.   Eyes: Negative for visual disturbance.  Respiratory: Negative for apnea, cough, chest tightness, shortness of breath and wheezing.   Cardiovascular: Negative for chest pain, palpitations and leg swelling.  Gastrointestinal: Negative for abdominal pain, anal bleeding, blood in stool, constipation, diarrhea, nausea and vomiting.  Endocrine: Negative for cold intolerance.  Genitourinary: Negative for difficulty urinating, dysuria, frequency and hematuria.  Musculoskeletal: Negative for arthralgias, back pain and neck pain.  Skin: Negative for rash.  Allergic/Immunologic: Negative for environmental allergies.  Neurological: Negative for dizziness, weakness, light-headedness, numbness and headaches.  Hematological: Negative for adenopathy.  Psychiatric/Behavioral: Negative for behavioral problems, dysphoric mood and sleep disturbance. The patient is  not nervous/anxious.    Per HPI unless specifically indicated above      Objective:    BP 125/79   Pulse (!) 48   Temp (!) 97.5 F (36.4 C) (Temporal)   Resp 16   Ht 5\' 11"  (1.803 m)   Wt 170 lb (77.1 kg)   SpO2 98%   BMI 23.71 kg/m   Wt Readings from Last 3 Encounters:  11/25/19 170 lb (77.1 kg)  08/11/19 177 lb (80.3 kg)  07/25/19 176 lb 12.8 oz (80.2 kg)    Physical Exam Vitals and nursing note reviewed.  Constitutional:      General: He is not in acute distress.    Appearance: He is well-developed. He is not diaphoretic.     Comments: Well-appearing, comfortable, cooperative  HENT:     Head: Normocephalic and atraumatic.     Right Ear: Tympanic membrane and ear canal normal.     Left Ear: Tympanic membrane and ear canal normal.  Eyes:     General:        Right eye: No discharge.  Left eye: No discharge.     Conjunctiva/sclera: Conjunctivae normal.     Pupils: Pupils are equal, round, and reactive to light.  Neck:     Thyroid: No thyromegaly.     Vascular: No carotid bruit.  Cardiovascular:     Rate and Rhythm: Normal rate and regular rhythm.     Heart sounds: Normal heart sounds. No murmur heard.   Pulmonary:     Effort: Pulmonary effort is normal. No respiratory distress.     Breath sounds: Normal breath sounds. No wheezing or rales.  Abdominal:     General: Bowel sounds are normal. There is no distension.     Palpations: Abdomen is soft. There is no mass.     Tenderness: There is no abdominal tenderness.  Musculoskeletal:        General: No tenderness. Normal range of motion.     Cervical back: Normal range of motion and neck supple.     Comments: Upper / Lower Extremities: - Normal muscle tone, strength bilateral upper extremities 5/5, lower extremities 5/5  Lymphadenopathy:     Cervical: No cervical adenopathy.  Skin:    General: Skin is warm and dry.     Findings: No erythema or rash.  Neurological:     Mental Status: He is alert and  oriented to person, place, and time.     Comments: Distal sensation intact to light touch all extremities  Psychiatric:        Behavior: Behavior normal.     Comments: Well groomed, good eye contact, normal speech and thoughts        Results for orders placed or performed in visit on 11/16/19  TSH  Result Value Ref Range   TSH 3.15 0.40 - 4.50 mIU/L  PSA  Result Value Ref Range   PSA 0.9 < OR = 4.0 ng/mL  Lipid panel  Result Value Ref Range   Cholesterol 135 <200 mg/dL   HDL 49 > OR = 40 mg/dL   Triglycerides 59 <150 mg/dL   LDL Cholesterol (Calc) 72 mg/dL (calc)   Total CHOL/HDL Ratio 2.8 <5.0 (calc)   Non-HDL Cholesterol (Calc) 86 <130 mg/dL (calc)  COMPLETE METABOLIC PANEL WITH GFR  Result Value Ref Range   Glucose, Bld 105 (H) 65 - 99 mg/dL   BUN 17 7 - 25 mg/dL   Creat 1.17 0.70 - 1.18 mg/dL   GFR, Est Non African American 62 > OR = 60 mL/min/1.17m2   GFR, Est African American 72 > OR = 60 mL/min/1.54m2   BUN/Creatinine Ratio NOT APPLICABLE 6 - 22 (calc)   Sodium 141 135 - 146 mmol/L   Potassium 4.5 3.5 - 5.3 mmol/L   Chloride 104 98 - 110 mmol/L   CO2 31 20 - 32 mmol/L   Calcium 9.5 8.6 - 10.3 mg/dL   Total Protein 6.8 6.1 - 8.1 g/dL   Albumin 4.2 3.6 - 5.1 g/dL   Globulin 2.6 1.9 - 3.7 g/dL (calc)   AG Ratio 1.6 1.0 - 2.5 (calc)   Total Bilirubin 0.5 0.2 - 1.2 mg/dL   Alkaline phosphatase (APISO) 99 35 - 144 U/L   AST 22 10 - 35 U/L   ALT 22 9 - 46 U/L  CBC with Differential/Platelet  Result Value Ref Range   WBC 4.1 3.8 - 10.8 Thousand/uL   RBC 5.14 4.20 - 5.80 Million/uL   Hemoglobin 14.2 13.2 - 17.1 g/dL   HCT 44.8 38 - 50 %   MCV 87.2 80.0 -  100.0 fL   MCH 27.6 27.0 - 33.0 pg   MCHC 31.7 (L) 32.0 - 36.0 g/dL   RDW 13.1 11.0 - 15.0 %   Platelets 292 140 - 400 Thousand/uL   MPV 11.1 7.5 - 12.5 fL   Neutro Abs 1,427 (L) 1,500 - 7,800 cells/uL   Lymphs Abs 1,989 850 - 3,900 cells/uL   Absolute Monocytes 455 200 - 950 cells/uL   Eosinophils Absolute  201 15 - 500 cells/uL   Basophils Absolute 29 0 - 200 cells/uL   Neutrophils Relative % 34.8 %   Total Lymphocyte 48.5 %   Monocytes Relative 11.1 %   Eosinophils Relative 4.9 %   Basophils Relative 0.7 %  Hemoglobin A1c  Result Value Ref Range   Hgb A1c MFr Bld 6.1 (H) <5.7 % of total Hgb   Mean Plasma Glucose 128 (calc)   eAG (mmol/L) 7.1 (calc)      I have personally reviewed the radiology report from 04/18/19 MRI Abdomen.  Narrative & Impression  CLINICAL DATA:  Indeterminate hepatic and splenic lesions on recent CT.  EXAM: MRI ABDOMEN WITHOUT AND WITH CONTRAST  TECHNIQUE: Multiplanar multisequence MR imaging of the abdomen was performed both before and after the administration of intravenous contrast.  CONTRAST:  9mL GADAVIST GADOBUTROL 1 MMOL/ML IV SOLN  COMPARISON:  CT on 03/28/2019  FINDINGS: Lower chest: No acute findings.  Hepatobiliary: A 1.5 cm mass is seen in the posterior right hepatic lobe which has classic characteristics of a benign hemangioma. Another focal area of hyperenhancement is seen in the anterior right hepatic lobe which shows lack contrast washout or restricted diffusion, as well as T2 isointensity. This is most consistent with benign focal nodular hyperplasia. Few other tiny sub-cm cysts are noted. Gallbladder is unremarkable. No evidence of biliary ductal dilatation.  Pancreas:  No mass or inflammatory changes.  Spleen: No evidence of splenomegaly. Multiple masses are seen throughout the spleen which show early peripheral nodular contrast enhancement and delayed central enhancement which matches blood pool on all phases, consistent with benign hemangiomas.  Adrenals/Urinary Tract: Several simple cysts are seen in the left kidney. 3.5 x 3.3 cm complex cyst is seen in the pole the right kidney which shows mild contour irregularity in several thin internal septations which show contrast enhancement. No thickened septations or  solid component visualized. No evidence of hydronephrosis.  Stomach/Bowel: Visualized portion unremarkable.  Vascular/Lymphatic: No pathologically enlarged lymph nodes identified. No abdominal aortic aneurysm.  Other:  None.  Musculoskeletal:  No suspicious bone lesions identified.  IMPRESSION: Small benign hemangioma and focal nodular hyperplasia in the right hepatic lobe, corresponding with lesions seen on recent CT.  Multiple benign splenic hemangiomas, corresponding with lesions seen on recent CT.  3.5 cm probably benign Bosniak category 41F cystic lesion in upper pole of right kidney. Recommend continued follow-up by MRI in 6 months. This recommendation follows ACR consensus guidelines: Management of the Incidental Renal Mass on CT: A White Paper of the ACR Incidental Findings Committee. J Am Coll Radiol [phone].   Electronically Signed   By: Marlaine Hind M.D.   On: 04/18/2019 08:42      Assessment & Plan:   Problem List Items Addressed This Visit    Pre-diabetes    A1c 6.1 significant improvement Prior Range 6.3 to 6.5 at risk of progression to DM Concern with HTN. Fam history of DM  Plan:  1. Not on any therapy currently 2. Encourage improved lifestyle - reviewed glycemic index handout, low  carb, low sugar diet, reduce portion size, continue improving regular exercise 6 mo      Hyperlipidemia    Controlled cholesterol on statin and lifestyle Last lipid panel 11/2019  Plan: 1. Continue current meds - Atorvastatin 10mg  daily 2. Encourage improved lifestyle - low carb/cholesterol, reduce portion size, continue improving regular exercise      Cyst of right kidney    Asymptomatic Follow-up with new upcoming MRI image once ordered - patient did not request order back in 09/2019, will place order once confirm with Radiology on recommended MRI type  History of incidental finding on 04/2019 MRI 3.5 cm probably benign Bosniak category 78F cystic  lesion in upper pole of right kidney      CKD (chronic kidney disease), stage II    See A&P HTN      Benign hypertension with CKD (chronic kidney disease), stage II    Controlled HTN - Home BP readings appropriate  Complication with CKD-II    Plan:  1. Continue current BP regimen - Losartan 100mg  daily, Propranolol 20mg  BID 2. Encourage improved lifestyle - low sodium diet, regular exercise 3. Continue monitor BP outside office, bring readings to next visit, if persistently >140/90 or new symptoms notify office sooner       Other Visit Diagnoses    Annual physical exam    -  Primary      Updated Health Maintenance information Reviewed recent lab results with patient Encouraged improvement to lifestyle with diet and exercise Maintain healthy weight  #Vertigo BPPV Handout on Epley maneuver given  No orders of the defined types were placed in this encounter.    Follow up plan: Return in about 6 months (around 05/26/2020) for 6 month follow-up PreDM, HTN .  Future MRI repeat R kidney follow-up cyst. To be ordered and scheduled.  [name], [facility] 11/25/2019, 10:01 AM

## 2019-11-27 NOTE — Assessment & Plan Note (Addendum)
Controlled cholesterol on statin and lifestyle Last lipid panel 11/2019  Plan: 1. Continue current meds - Atorvastatin 10mg  daily 2. Encourage improved lifestyle - low carb/cholesterol, reduce portion size, continue improving regular exercise

## 2019-11-27 NOTE — Assessment & Plan Note (Signed)
Asymptomatic Follow-up with new upcoming MRI image once ordered - patient did not request order back in 09/2019, will place order once confirm with Radiology on recommended MRI type  History of incidental finding on 04/2019 MRI 3.5 cm probably benign Bosniak category 8F cystic lesion in upper pole of right kidney

## 2019-11-27 NOTE — Assessment & Plan Note (Signed)
Controlled HTN - Home BP readings appropriate  Complication with CKD-II    Plan:  1. Continue current BP regimen - Losartan 100mg  daily, Propranolol 20mg  BID 2. Encourage improved lifestyle - low sodium diet, regular exercise 3. Continue monitor BP outside office, bring readings to next visit, if persistently >140/90 or new symptoms notify office sooner

## 2019-11-27 NOTE — Assessment & Plan Note (Signed)
See A&P HTN 

## 2019-11-27 NOTE — Assessment & Plan Note (Addendum)
A1c 6.1 significant improvement Prior Range 6.3 to 6.5 at risk of progression to DM Concern with HTN. Fam history of DM  Plan:  1. Not on any therapy currently 2. Encourage improved lifestyle - reviewed glycemic index handout, low carb, low sugar diet, reduce portion size, continue improving regular exercise 6 mo

## 2019-11-30 ENCOUNTER — Other Ambulatory Visit: Payer: Self-pay | Admitting: Family Medicine

## 2019-11-30 DIAGNOSIS — N281 Cyst of kidney, acquired: Secondary | ICD-10-CM

## 2019-12-06 ENCOUNTER — Ambulatory Visit
Admission: RE | Admit: 2019-12-06 | Discharge: 2019-12-06 | Disposition: A | Discharge status: Home / Self Care | Payer: Medicare HMO | Source: Ambulatory Visit | Attending: Family Medicine | Admitting: Family Medicine

## 2019-12-06 ENCOUNTER — Other Ambulatory Visit: Payer: Self-pay

## 2019-12-06 DIAGNOSIS — N281 Cyst of kidney, acquired: Secondary | ICD-10-CM | POA: Diagnosis not present

## 2019-12-06 MED ORDER — GADOBUTROL 1 MMOL/ML IV SOLN
7.5000 mL | Freq: Once | INTRAVENOUS | Status: AC | PRN
  Administered 2019-12-06: 7.5 mL via INTRAVENOUS

## 2019-12-22 ENCOUNTER — Other Ambulatory Visit: Payer: Self-pay | Admitting: Family Medicine

## 2019-12-22 DIAGNOSIS — E7849 Other hyperlipidemia: Secondary | ICD-10-CM

## 2019-12-22 NOTE — Telephone Encounter (Signed)
Requested Prescriptions  Pending Prescriptions Disp Refills  . atorvastatin (LIPITOR) 10 MG tablet [Pharmacy Med Name: ATORVASTATIN CALCIUM 10 MG Tablet] 90 tablet 3    Sig: TAKE 1 TABLET DAILY AT 6 PM.     Cardiovascular:  Antilipid - Statins Passed - 12/22/2019  3:48 PM      Passed - Total Cholesterol in normal range and within 360 days    Cholesterol, Total  Date Value Ref Range Status  10/15/2015 121 100 - 199 mg/dL Final   Cholesterol  Date Value Ref Range Status  11/16/2019 135 <200 mg/dL Final         Passed - LDL in normal range and within 360 days    LDL Cholesterol (Calc)  Date Value Ref Range Status  11/16/2019 72 mg/dL (calc) Final    Comment:    Reference range: <100 . Desirable range <100 mg/dL for primary prevention;   <70 mg/dL for patients with CHD or diabetic patients  with > or = 2 CHD risk factors. Marland Kitchen LDL-C is now calculated using the Martin-Hopkins  calculation, which is a validated novel method providing  better accuracy than the Friedewald equation in the  estimation of LDL-C.  Cresenciano Genre et al. Annamaria Helling. 4383;818(40): 2061-2068  (http://education.[facility].com/faq/FAQ164)          Passed - HDL in normal range and within 360 days    HDL  Date Value Ref Range Status  11/16/2019 49 > OR = 40 mg/dL Final  10/15/2015 47 >39 mg/dL Final         Passed - Triglycerides in normal range and within 360 days    Triglycerides  Date Value Ref Range Status  11/16/2019 59 <150 mg/dL Final         Passed - Patient is not pregnant      Passed - Valid encounter within last 12 months    Recent Outpatient Visits          3 weeks ago Annual physical exam   [place], [place]   4 months ago External hemorrhoid   [facility] [place], Devonne Doughty, DO   5 months ago Benign hypertension with CKD (chronic kidney disease), stage II   [facility] [place], Devonne Doughty, DO   9 months ago  Neck strain, initial encounter   [place], DO   1 year ago Functional GI symptoms   [place], Devonne Doughty, [place]

## 2020-01-05 ENCOUNTER — Telehealth: Payer: Self-pay

## 2020-01-05 ENCOUNTER — Ambulatory Visit: Payer: Self-pay | Admitting: General Practice

## 2020-01-05 NOTE — Chronic Care Management (AMB) (Signed)
  Chronic Care Management   Outreach Note  01/05/2020 Name: Jesse Figueroa MRN: [id number] DOB: [birth year]  Referred by: Olin Hauser, DO Reason for referral : Chronic Care Management (RNCM: Follow up on Chronic Disease Managment and Care Coordination Needs)   An unsuccessful telephone outreach was attempted today. The patient was referred to the case management team for assistance with care management and care coordination. Spoke to the patients wife and she was going to tell the patient that [facility].   Follow Up Plan: The care management team will reach out to the patient again over the next 30 to 60 days.   Noreene Larsson RN, MSN, [place] [employer] Mobile: [phone]

## 2020-02-06 ENCOUNTER — Telehealth: Payer: Self-pay | Admitting: General Practice

## 2020-02-06 ENCOUNTER — Ambulatory Visit (INDEPENDENT_AMBULATORY_CARE_PROVIDER_SITE_OTHER): Payer: Medicare HMO | Admitting: General Practice

## 2020-02-06 DIAGNOSIS — N182 Chronic kidney disease, stage 2 (mild): Secondary | ICD-10-CM

## 2020-02-06 DIAGNOSIS — I129 Hypertensive chronic kidney disease with stage 1 through stage 4 chronic kidney disease, or unspecified chronic kidney disease: Secondary | ICD-10-CM | POA: Diagnosis not present

## 2020-02-06 DIAGNOSIS — M25511 Pain in right shoulder: Secondary | ICD-10-CM

## 2020-02-06 DIAGNOSIS — I10 Essential (primary) hypertension: Secondary | ICD-10-CM | POA: Diagnosis not present

## 2020-02-06 DIAGNOSIS — M25521 Pain in right elbow: Secondary | ICD-10-CM

## 2020-02-06 DIAGNOSIS — R7303 Prediabetes: Secondary | ICD-10-CM

## 2020-02-06 DIAGNOSIS — E7849 Other hyperlipidemia: Secondary | ICD-10-CM | POA: Diagnosis not present

## 2020-02-06 DIAGNOSIS — M25512 Pain in left shoulder: Secondary | ICD-10-CM

## 2020-02-06 NOTE — Chronic Care Management (AMB) (Signed)
Chronic Care Management   Follow Up Note   02/06/2020 Name: Jesse Figueroa MRN: [id number] DOB: 08-05-1950  Referred by: Olin Hauser, DO Reason for referral : Chronic Care Management (RNCM Follow up for Chronic Disease Managment and Care Coordination Needs)   ROJELIO UHRICH is a 69 y.o. year old male who is a primary care patient of Olin Hauser, DO. The CCM team was consulted for assistance with chronic disease management and care coordination needs.    Review of patient status, including review of consultants reports, relevant laboratory and other test results, and collaboration with appropriate care team members and the patient's provider was performed as part of comprehensive patient evaluation and provision of chronic care management services.    SDOH (Social Determinants of Health) assessments performed: Yes See Care Plan activities for detailed interventions related to [facility])     Outpatient Encounter Medications as of 02/06/2020  Medication Sig  . aspirin 81 MG chewable tablet Chew by mouth.  Marland Kitchen atorvastatin (LIPITOR) 10 MG tablet TAKE 1 TABLET DAILY AT 6 PM.  . fluticasone (FLONASE) 50 MCG/ACT nasal spray USE 2 SPRAYS IN BOTH NOSTRILS DAILY. USE FOR 4 TO 6 WEEKS THEN STOP AND USE SEASONALLY OR AS NEEDED.  Marland Kitchen losartan (COZAAR) 100 MG tablet TAKE 1 TABLET EVERY DAY  . propranolol (INDERAL) 20 MG tablet TAKE 1 TABLET TWICE DAILY   No facility-administered encounter medications on file as of 02/06/2020.     Objective:  BP Readings from Last 3 Encounters:  11/25/19 125/79  08/11/19 135/80  07/25/19 133/79    Goals Addressed            This Visit's Progress   . RNCM: I do want to know how to better manage my hypertension       Current Barriers:  . Chronic Disease Management support, education, and care coordination needs related to HTN, HLD, CKD Stage 2, and stomach issues . COVID Vaccination concerns/questions- the patient has received both vaccines  for COVID and feels safer  Clinical Goal(s) related to: HTN, HLD, CKD Stage 2, and stomach issues COVID Vaccination concerns/Questions Over the next 120 days, patient will:  . Work with the care management team to address educational, disease management, and care coordination needs  . Begin or continue self health monitoring activities as directed today Measure and record blood pressure 5 to 6 times per week and record  and monitor for factors that trigger issues related to stomach issues and concerns . Call provider office for new or worsened signs and symptoms Blood pressure findings outside established parameters, New or worsened symptom related to stomach issues, and changes in chronic disease process . Call care management team with questions or concerns . Verbalize basic understanding of patient centered plan of care established today . Receive desired COVID19 vaccination   Interventions related to HTN, HLD, CKD Stage 2, and stomach issues . Evaluation of current treatment plans and patient's adherence to plan as established by provider.  The patient verbalized he is managing his health well at this time and has not new concerns . Assessed patient understanding of disease states. Has good understanding of his conditions and how to effectively manage . Assessed patient's education and care coordination needs, the patient declines assistance of the pharmacist and social worker at this time; however knows they are available to assist with any new concerns/help . Provided disease specific education to patient to assist with managing his HTN, and other chronic medical conditions. Review of  a heart healthy diet and regular exercise to maintain health and well being.  02-06-2020: The patient states that he is trying to watch his dietary intake and eat healthy. Does pretty good most of the time. Praised for hemoglobin A1C of 6.1 and remaining in good control of monitoring for pre-diabetes.  . Reviewed  heart healthy diet. The patient states he is watching what he eats and his blood pressures are ranging around 829 to 562'Z systolic and around 90 diastolic. Education on heart rate. In the office back in June it was low but now the patient verbalized it is in the 60's to 70's. The patient keeps a record of it.  The patient denies any new concerns at this time.  . Assessed the patients stomach issues. The patient states he is not having problems like he was before. He is thankful for resolution at this time. Resolved and not having any new issues with his stomach.  Denies any issues with his stomach at this time. Will continue to monitor.  . Evaluation of new concerns. The patient verbalized he needs to come in and see Dr. Parks Ranger. He has a new complaint of shoulder pain and right elbow pain.  The patient is wearing a "sleeve" on his elbow and this helps sometimes but sometimes he has sharp pains in his elbow, especially when he is lifting the tailgate of his truck or using his right arm. The patient denies it waking him from sleep. Does want to get it checked out to see what the recommendations of the pcp are.  Nash Dimmer with appropriate clinical care team members regarding patient needs.  No new needs at this time.  . Assessed upcoming appointments with pcp: Last appointment with the pcp was 11-25-2019. Will contact the front desk to get an appointment for the pcp to evaluate shoulder and right elbow pain.   Patient Self Care Activities related to HTN, HLD, CKD Stage 2, and stomach issues . Patient is unable to independently self-manage chronic health conditions  Please see past updates related to this goal by clicking on the "Past Updates" button in the selected goal          Plan:   Telephone follow up appointment with care management team member scheduled for: 04-09-2020 at 0900 am   [place], MSN, [place] Mobile: [phone]

## 2020-02-06 NOTE — Patient Instructions (Signed)
Visit Information  Goals Addressed            This Visit's Progress    RNCM: I do want to know how to better manage my hypertension       Current Barriers:   Chronic Disease Management support, education, and care coordination needs related to HTN, HLD, CKD Stage 2, and stomach issues  COVID Vaccination concerns/questions- the patient has received both vaccines for COVID and feels safer  Clinical Goal(s) related to: HTN, HLD, CKD Stage 2, and stomach issues COVID Vaccination concerns/Questions Over the next 120 days, patient will:   Work with the care management team to address educational, disease management, and care coordination needs   Begin or continue self health monitoring activities as directed today Measure and record blood pressure 5 to 6 times per week and record  and monitor for factors that trigger issues related to stomach issues and concerns  Call provider office for new or worsened signs and symptoms Blood pressure findings outside established parameters, New or worsened symptom related to stomach issues, and changes in chronic disease process  Call care management team with questions or concerns  Verbalize basic understanding of patient centered plan of care established today  Receive desired COVID19 vaccination   Interventions related to HTN, HLD, CKD Stage 2, and stomach issues  Evaluation of current treatment plans and patient's adherence to plan as established by provider.  The patient verbalized he is managing his health well at this time and has not new concerns  Assessed patient understanding of disease states. Has good understanding of his conditions and how to effectively manage  Assessed patient's education and care coordination needs, the patient declines assistance of the pharmacist and social worker at this time; however knows they are available to assist with any new concerns/help  Provided disease specific education to patient to assist with  managing his HTN, and other chronic medical conditions. Review of a heart healthy diet and regular exercise to maintain health and well being.  02-06-2020: The patient states that he is trying to watch his dietary intake and eat healthy. Does pretty good most of the time. Praised for hemoglobin A1C of 6.1 and remaining in good control of monitoring for pre-diabetes.   Reviewed heart healthy diet. The patient states he is watching what he eats and his blood pressures are ranging around 659 to 935'T systolic and around 90 diastolic. Education on heart rate. In the office back in June it was low but now the patient verbalized it is in the 60's to 70's. The patient keeps a record of it.  The patient denies any new concerns at this time.   Assessed the patients stomach issues. The patient states he is not having problems like he was before. He is thankful for resolution at this time. Resolved and not having any new issues with his stomach.  Denies any issues with his stomach at this time. Will continue to monitor.   Evaluation of new concerns. The patient verbalized he needs to come in and see Dr. Parks Ranger. He has a new complaint of shoulder pain and right elbow pain.  The patient is wearing a "sleeve" on his elbow and this helps sometimes but sometimes he has sharp pains in his elbow, especially when he is lifting the tailgate of his truck or using his right arm. The patient denies it waking him from sleep. Does want to get it checked out to see what the recommendations of the pcp are.  Collaborated with appropriate clinical care team members regarding patient needs.  No new needs at this time.   Assessed upcoming appointments with pcp: Last appointment with the pcp was 11-25-2019. Will contact the front desk to get an appointment for the pcp to evaluate shoulder and right elbow pain.   Patient Self Care Activities related to HTN, HLD, CKD Stage 2, and stomach issues  Patient is unable to independently  self-manage chronic health conditions  Please see past updates related to this goal by clicking on the "Past Updates" button in the selected goal         Patient verbalizes understanding of instructions provided today.   Telephone follow up appointment with care management team member scheduled for: 04-09-2020 at 0900 am  Noreene Larsson RN, MSN, [place] Mobile: [phone]

## 2020-02-09 ENCOUNTER — Encounter: Payer: Self-pay | Admitting: Family Medicine

## 2020-02-09 ENCOUNTER — Other Ambulatory Visit: Payer: Self-pay

## 2020-02-09 ENCOUNTER — Ambulatory Visit (INDEPENDENT_AMBULATORY_CARE_PROVIDER_SITE_OTHER): Payer: Medicare HMO | Admitting: Family Medicine

## 2020-02-09 VITALS — BP 122/70 | HR 58 | Temp 97.5°F | Resp 16 | Ht 72.0 in | Wt 171.6 lb

## 2020-02-09 DIAGNOSIS — M25511 Pain in right shoulder: Secondary | ICD-10-CM

## 2020-02-09 DIAGNOSIS — M542 Cervicalgia: Secondary | ICD-10-CM

## 2020-02-09 DIAGNOSIS — G8929 Other chronic pain: Secondary | ICD-10-CM

## 2020-02-09 DIAGNOSIS — M7551 Bursitis of right shoulder: Secondary | ICD-10-CM | POA: Diagnosis not present

## 2020-02-09 DIAGNOSIS — M7552 Bursitis of left shoulder: Secondary | ICD-10-CM

## 2020-02-09 DIAGNOSIS — M25512 Pain in left shoulder: Secondary | ICD-10-CM

## 2020-02-09 DIAGNOSIS — M778 Other enthesopathies, not elsewhere classified: Secondary | ICD-10-CM

## 2020-02-09 DIAGNOSIS — M25521 Pain in right elbow: Secondary | ICD-10-CM | POA: Diagnosis not present

## 2020-02-09 MED ORDER — BACLOFEN 10 MG PO TABS: 5.0000 mg | ORAL_TABLET | Freq: Two times a day (BID) | ORAL | 2 refills | Status: DC | PRN

## 2020-02-09 NOTE — Progress Notes (Signed)
Subjective:    [name], male    DOB: 1950-11-23, 69 y.o.   MRN: [id number]  Jesse Figueroa is a 69 y.o. male presenting on 02/09/2020 for Shoulder Pain (both side onset week), Joint Swelling (Right side --elbow pain onset 3 weeks), and Neck Pain (RIght side--intermitten from 6 month )   HPI   Right Sided Neck Pain - Last visit with me in 2020 and 07/2019, for same problem neck, treated with baclofen in past, see prior notes for background information.  Interval update, now >6 months since last treatment seems return of symptoms. Admits stiffness, limiting his range of motion He has done massage therapy on his neck in past limited results at times. Or temporary  He feels worse at times is some stiff and soreness with neck in AM otherwise it is improved with some lifestyle changes, and massage machine, heat/ice as needed  Bilateral Shoulder Pain / Stiffness 1 week ago onset, overhead activity repetitive. Has good ROM.  Right Elbow pain Onset 3 weeks, worse with lifting upward  Only bothers him when he does activities Does not keep him awake at night or interfere with his daily activity   Denies any trauma injury, fall other joint pain, bruising redness numbness tingling   Depression screen [facility] 2/9 11/25/2019 10/27/2019 08/11/2019  Decreased Interest 0 0 0  Down, Depressed, Hopeless 0 0 0  PHQ - 2 Score 0 0 0    Social History   Tobacco Use  . Smoking status: Never Smoker  . Smokeless tobacco: Never Used  Vaping Use  . Vaping Use: Never used  Substance Use Topics  . Alcohol use: No  . Drug use: No    Review of Systems Per HPI unless specifically indicated above     Objective:    BP 122/70   Pulse (!) 58   Temp (!) 97.5 F (36.4 C) (Temporal)   Resp 16   Ht 6' (1.829 m)   Wt 171 lb 9.6 oz (77.8 kg)   SpO2 98%   BMI 23.27 kg/m   Wt Readings from Last 3 Encounters:  02/09/20 171 lb 9.6 oz (77.8 kg)  11/25/19 170 lb (77.1 kg)  08/11/19 177  lb (80.3 kg)    Physical Exam Vitals and nursing note reviewed.  Constitutional:      General: He is not in acute distress.    Appearance: He is well-developed. He is not diaphoretic.     Comments: Well-appearing, comfortable, cooperative  HENT:     Head: Normocephalic and atraumatic.  Eyes:     General:        Right eye: No discharge.        Left eye: No discharge.     Conjunctiva/sclera: Conjunctivae normal.  Neck:     Comments: Neck Inspection: symmetrical, normal Palpation: mild localized hypertonicity of left lower posterior neck muscle, in lower trapezius region. Not SCM. Not lower in shoulder ROM: full active ROM - some reproduced discomfort with R neck rotation, otherwise has intact range PREVIOUSLY Spurling's negative for radiculopathy not repeated today Strength: normal 5/5 upper ext Neurovascular: distal intact Cardiovascular:     Rate and Rhythm: Normal rate.  Pulmonary:     Effort: Pulmonary effort is normal.  Musculoskeletal:     Comments: Bilateral Shoulder Inspection: Normal appearance bilateral symmetrical Palpation: Non-tender to palpation over anterior, lateral, or posterior shoulder  ROM: Full intact active ROM forward flexion, abduction, internal / external rotation, symmetrical Special Testing: Rotator  cuff testing negative for weakness with supraspinatus full can and empty can test, Hawkin's AC impingement negative for pain Strength: Normal strength 5/5 flex/ext, ext rot / int rot, grip, rotator cuff str testing. Neurovascular: Distally intact pulses, sensation to light touch   Skin:    General: Skin is warm and dry.     Findings: No erythema or rash.  Neurological:     Mental Status: He is alert and oriented to person, place, and time.  Psychiatric:        Behavior: Behavior normal.     Comments: Well groomed, good eye contact, normal speech and thoughts    Results for orders placed or performed in visit on 11/16/19  TSH  Result Value Ref Range    TSH 3.15 0.40 - 4.50 mIU/L  PSA  Result Value Ref Range   PSA 0.9 < OR = 4.0 ng/mL  Lipid panel  Result Value Ref Range   Cholesterol 135 <200 mg/dL   HDL 49 > OR = 40 mg/dL   Triglycerides 59 <150 mg/dL   LDL Cholesterol (Calc) 72 mg/dL (calc)   Total CHOL/HDL Ratio 2.8 <5.0 (calc)   Non-HDL Cholesterol (Calc) 86 <130 mg/dL (calc)  COMPLETE METABOLIC PANEL WITH GFR  Result Value Ref Range   Glucose, Bld 105 (H) 65 - 99 mg/dL   BUN 17 7 - 25 mg/dL   Creat 1.17 0.70 - 1.18 mg/dL   GFR, Est Non African American 62 > OR = 60 mL/min/1.66m2   GFR, Est African American 72 > OR = 60 mL/min/1.74m2   BUN/Creatinine Ratio NOT APPLICABLE 6 - 22 (calc)   Sodium 141 135 - 146 mmol/L   Potassium 4.5 3.5 - 5.3 mmol/L   Chloride 104 98 - 110 mmol/L   CO2 31 20 - 32 mmol/L   Calcium 9.5 8.6 - 10.3 mg/dL   Total Protein 6.8 6.1 - 8.1 g/dL   Albumin 4.2 3.6 - 5.1 g/dL   Globulin 2.6 1.9 - 3.7 g/dL (calc)   AG Ratio 1.6 1.0 - 2.5 (calc)   Total Bilirubin 0.5 0.2 - 1.2 mg/dL   Alkaline phosphatase (APISO) 99 35 - 144 U/L   AST 22 10 - 35 U/L   ALT 22 9 - 46 U/L  CBC with Differential/Platelet  Result Value Ref Range   WBC 4.1 3.8 - 10.8 Thousand/uL   RBC 5.14 4.20 - 5.80 Million/uL   Hemoglobin 14.2 13.2 - 17.1 g/dL   HCT 44.8 38 - 50 %   MCV 87.2 80.0 - 100.0 fL   MCH 27.6 27.0 - 33.0 pg   MCHC 31.7 (L) 32.0 - 36.0 g/dL   RDW 13.1 11.0 - 15.0 %   Platelets 292 140 - 400 Thousand/uL   MPV 11.1 7.5 - 12.5 fL   Neutro Abs 1,427 (L) 1,500 - 7,800 cells/uL   Lymphs Abs 1,989 850 - 3,900 cells/uL   Absolute Monocytes 455 200 - 950 cells/uL   Eosinophils Absolute 201 15 - 500 cells/uL   Basophils Absolute 29 0 - 200 cells/uL   Neutrophils Relative % 34.8 %   Total Lymphocyte 48.5 %   Monocytes Relative 11.1 %   Eosinophils Relative 4.9 %   Basophils Relative 0.7 %  Hemoglobin A1c  Result Value Ref Range   Hgb A1c MFr Bld 6.1 (H) <5.7 % of total Hgb   Mean Plasma Glucose 128 (calc)    eAG (mmol/L) 7.1 (calc)      Assessment & Plan:  Problem List Items Addressed This Visit    None    Visit Diagnoses    Pain in right elbow    -  Primary   Relevant Medications   baclofen (LIORESAL) 10 MG tablet   Chronic pain of both shoulders       Relevant Medications   baclofen (LIORESAL) 10 MG tablet   Neck pain on right side       Relevant Medications   baclofen (LIORESAL) 10 MG tablet   Bilateral shoulder bursitis       Right elbow tendonitis          Persistent now chronic >6 months L lower cervical paraspinal muscle spasm without known injury, also trapezius distrubution. Additionally seems to have bilateral shoulder bursitis symptoms, without evidence of rotator cuff injury or limited ROM on exam. Reassuring. Has had some repetitive overhead activities to provoke Additionally R elbow bicep tendon area inner aspect without evidence of injury but has repetitive use symptoms  Limited symptoms at rest and daily activity, usually provoked activities only  No evidence of radicular symptoms or neurological deficits or weakness. - Has intact ROM No imaging including C spine, no prior dx arthritis  - Inadequate conservative treatments at home  Plan Offered X-rays today again Neck / Shoulders - we agree to hold for now.  - Re order Baclofen 5-10mg  QHS PRN muscle relaxant, caution sedation - Add Tylenol 500-1000mg  TID PRN  - Consider NSAID PRN if need OTC instruction given - Heating pad / ice, consider muscle rub - ROM stretching  Refer to [facility] handwritten order given today  - Follow up if not improving - we can refer to Ortho vs X-ray    Meds ordered this encounter  Medications  . baclofen (LIORESAL) 10 MG tablet    Sig: Take 0.5-1 tablets (5-10 mg total) by mouth 2 (two) times daily as needed for muscle spasms.    Dispense:  30 each    Refill:  2     Follow up plan: Return in about 6 weeks (around 03/22/2020), or if symptoms worsen or fail to  improve, for joint pain, if not improved.   [name], [facility] 02/09/2020, 11:03 AM

## 2020-02-09 NOTE — Patient Instructions (Addendum)
Thank you for coming to the office today.  Start taking Baclofen (Lioresal) 10mg  (muscle relaxant) - start with one pill at night as needed for next 1-3 nights (may make you drowsy, caution with driving) see how it affects you, then if tolerated increase to one pill 2 to 3 times a day or (every 8 hours as needed)  Recommend to start taking Tylenol Extra Strength 500mg  tabs - take 1 to 2 tabs per dose (max 1000mg ) every 6-8 hours for pain (take regularly, don't skip a dose for next 7 days), max 24 hour daily dose is 6 tablets or 3000mg . In the future you can repeat the same everyday Tylenol course for 1-2 weeks at a time.   If significant flare of pain you can add anti inflammatory Recommend trial of Anti-inflammatory with OTC Aleve or Naproxen 250  tabs - take one to two with food and plenty of water TWICE daily every day (breakfast and dinner), for next 1-2 weeks, then you may take only as needed  Compression sleeve and topical muscle rub is helpful as well. Ice packs as needed.  Likely some arthritis and bursitis  Call [facility] to schedule bring the order.  Please schedule a Follow-up Appointment to: Return in about 6 weeks (around 03/22/2020), or if symptoms worsen or fail to improve, for joint pain, if not improved.  If you have any other questions or concerns, please feel free to call the office or send a message through [place]. You may also schedule an earlier appointment if necessary.  Additionally, you may be receiving a survey about your experience at our office within a few days to 1 week by e-mail or mail. We value your feedback.  [name], DO [place]

## 2020-02-17 ENCOUNTER — Other Ambulatory Visit: Payer: Self-pay | Admitting: Family Medicine

## 2020-02-17 DIAGNOSIS — G8929 Other chronic pain: Secondary | ICD-10-CM

## 2020-02-17 DIAGNOSIS — M25521 Pain in right elbow: Secondary | ICD-10-CM

## 2020-02-17 DIAGNOSIS — M542 Cervicalgia: Secondary | ICD-10-CM

## 2020-02-17 DIAGNOSIS — M25512 Pain in left shoulder: Secondary | ICD-10-CM

## 2020-02-17 NOTE — Telephone Encounter (Signed)
Requested medication (s) are due for refill today: yes  Requested medication (s) are on the active medication list: yes  Last refill:  02/09/20  #30 2 refills  Future visit scheduled: No  Notes to clinic:  Medication not delegated    Requested Prescriptions  Pending Prescriptions Disp Refills   baclofen (LIORESAL) 10 MG tablet [Pharmacy Med Name: BACLOFEN 10 MG TABLET] 30 tablet 2    Sig: Take 0.5-1 tablets (5-10 mg total) by mouth 2 (two) times daily as needed for muscle spasms.      Not Delegated - Analgesics:  Muscle Relaxants Failed - 02/17/2020  9:30 AM      Failed - This refill cannot be delegated      Passed - Valid encounter within last 6 months    Recent Outpatient Visits           1 week ago Pain in right elbow   [place], DO   2 months ago Annual physical exam   [facility] Olin Hauser, DO   6 months ago External hemorrhoid   [facility] Olin Hauser, DO   6 months ago Benign hypertension with CKD (chronic kidney disease), stage II   [facility] Olin Hauser, DO   11 months ago Neck strain, initial encounter   [place], Devonne Doughty, [place]

## 2020-03-06 ENCOUNTER — Other Ambulatory Visit: Payer: Self-pay | Admitting: Family Medicine

## 2020-03-06 DIAGNOSIS — M542 Cervicalgia: Secondary | ICD-10-CM

## 2020-03-06 DIAGNOSIS — M25511 Pain in right shoulder: Secondary | ICD-10-CM

## 2020-03-06 DIAGNOSIS — G8929 Other chronic pain: Secondary | ICD-10-CM

## 2020-03-06 DIAGNOSIS — M25521 Pain in right elbow: Secondary | ICD-10-CM

## 2020-03-06 NOTE — Telephone Encounter (Signed)
Pharmacy request changes to original Rx- non delegated Rx: 90 day supply. Sent for PCP review of request

## 2020-03-12 ENCOUNTER — Other Ambulatory Visit: Payer: Self-pay | Admitting: Family Medicine

## 2020-03-12 DIAGNOSIS — I10 Essential (primary) hypertension: Secondary | ICD-10-CM

## 2020-03-12 NOTE — Telephone Encounter (Signed)
Requested Prescriptions  Pending Prescriptions Disp Refills   losartan (COZAAR) 100 MG tablet [Pharmacy Med Name: LOSARTAN POTASSIUM 100 MG Tablet] 90 tablet 0    Sig: TAKE 1 TABLET EVERY DAY     Cardiovascular:  Angiotensin Receptor Blockers Passed - 03/12/2020  1:56 PM      Passed - Cr in normal range and within 180 days    Creat  Date Value Ref Range Status  11/16/2019 1.17 0.70 - 1.18 mg/dL Final    Comment:    For patients >71 years of age, the reference limit for Creatinine is approximately 13% higher for people identified as African-American. .          Passed - K in normal range and within 180 days    Potassium  Date Value Ref Range Status  11/16/2019 4.5 3.5 - 5.3 mmol/L Final         Passed - Patient is not pregnant      Passed - Last BP in normal range    BP Readings from Last 1 Encounters:  02/09/20 122/70         Passed - Valid encounter within last 6 months    Recent Outpatient Visits          1 month ago Pain in right elbow   [place], DO   3 months ago Annual physical exam   [facility] Olin Hauser, DO   7 months ago External hemorrhoid   [facility] [place], Devonne Doughty, DO   7 months ago Benign hypertension with CKD (chronic kidney disease), stage II   [place], Devonne Doughty, DO   1 year ago Neck strain, initial encounter   [place], Devonne Doughty, [place]

## 2020-03-23 ENCOUNTER — Ambulatory Visit: Payer: Self-pay | Admitting: *Deleted

## 2020-03-23 DIAGNOSIS — W57XXXA Bitten or stung by nonvenomous insect and other nonvenomous arthropods, initial encounter: Secondary | ICD-10-CM | POA: Diagnosis not present

## 2020-03-23 DIAGNOSIS — S50869A Insect bite (nonvenomous) of unspecified forearm, initial encounter: Secondary | ICD-10-CM | POA: Diagnosis not present

## 2020-03-23 NOTE — Telephone Encounter (Signed)
Per initial encounter, "On wed pt thought he got into some poison ivy, because he got a rash on both arms, and they were itching. But now he has a bump, and black spot at tip of knot. Like a possible bug bite. Would like to speak with the nurse"; contacted pt to discuss symptoms; the pt says his left arm has a pimple-sized"black spot on it (below the elbow), and the area is swollen;  but the rash is still on both arms; the black spot is" the size of a pimple"; both arms have a barely visible rash L>R and have small bumps; he has tried calamine lotion for the itching; recommendations made per nurse triage protocol; decision tree completed; he verbalized understanding; he is seen by Dr Parks Ranger, [place], but their is no availability within the timeframe per protocol; the pt states he will go to urgent care instead of waiting for next available appt on 03/27/20.  Reason for Disposition . Looks like a boil, infected sore, deep ulcer or other infected rash  Answer Assessment - Initial Assessment Questions 1. APPEARANCE of RASH: "Describe the rash."      Barely visible 2. LOCATION: "Where is the rash located?"      Bilateral arms L>R 3. NUMBER: "How many spots are there?"       Too many to count 4. SIZE: "How big are the spots?" (Inches, centimeters or compare to size of a coin)     4-5 inches 5. ONSET: "When did the rash start?"      03/21/20 6. ITCHING: "Does the rash itch?" If Yes, ask: "How bad is the itch?"  (Scale 1-10; or mild, moderate, severe)     mild 7. PAIN: "Does the rash hurt?" If Yes, ask: "How bad is the pain?"  (Scale 1-10; or mild, moderate, severe)     no 8. OTHER SYMPTOMS: "Do you have any other symptoms?" (e.g., fever)     Swollen black spot on left arm 9. PREGNANCY: "Is there any chance you are pregnant?" "When was your last menstrual period?"    n/a  Protocols used: RASH OR REDNESS - LOCALIZED-A-AH

## 2020-03-27 ENCOUNTER — Other Ambulatory Visit: Payer: Self-pay

## 2020-03-27 ENCOUNTER — Encounter: Payer: Self-pay | Admitting: Family Medicine

## 2020-03-27 ENCOUNTER — Ambulatory Visit (INDEPENDENT_AMBULATORY_CARE_PROVIDER_SITE_OTHER): Payer: Medicare HMO | Admitting: Family Medicine

## 2020-03-27 VITALS — BP 132/83 | HR 56 | Ht 72.0 in | Wt 168.4 lb

## 2020-03-27 DIAGNOSIS — Z23 Encounter for immunization: Secondary | ICD-10-CM

## 2020-03-27 DIAGNOSIS — R21 Rash and other nonspecific skin eruption: Secondary | ICD-10-CM | POA: Diagnosis not present

## 2020-03-27 MED ORDER — PREDNISONE 10 MG PO TABS: ORAL_TABLET | ORAL | 0 refills | Status: AC

## 2020-03-27 NOTE — Patient Instructions (Addendum)
As we discussed, appears you have a rash from either poison ivy, oak or sumac.    I have sent in a prescription for prednisone to take: Days 1-3: 30mg  daily (3 tablets) Days 4-6: 20mg  daily (2 tablets) Days 7-9: 10mg  daily (1 tablet)  As we discussed, avoid all NSAIDs while taking prednisone (ibuprofen, naproxen, advil, aleve, goody's, BC powder) as taking these medications together can cause stomach upset  Be sure to not point any ointment/topicals on the rash, we want the vesicles to dry out as this will help the healing process.  We have given you your influenza vaccine today  We will plan to see you back if your symptoms worsen or fail to improve  You will receive a survey after today's visit either digitally by e-mail or paper by USPS mail. Your experiences and feedback matter to [place].  Please respond so we know how we are doing as we provide care for you.  Call us with any questions/concerns/needs.  It is my goal to be available to you for your health concerns.  Thanks for choosing me to be a partner in your healthcare needs!  Harlin Rain, FNP-C Family Nurse Practitioner [facility] Phone: [phone]

## 2020-03-27 NOTE — Assessment & Plan Note (Signed)
Pt > age 69.  Needs annual influenza vaccine.  VIS provided.  Plan: 1. Administer high dose fluzone today.  

## 2020-03-27 NOTE — Progress Notes (Signed)
Subjective:    [name], male    DOB: Feb 19, 1951, 69 y.o.   MRN: [id number]  Jesse Figueroa is a 69 y.o. male presenting on 03/27/2020 for Rash (bilateral rash on the arms and abdomen x 6 days.  Pt was seen at the Urgent Care on Friday, Oct 8th.  and was told that it appeared to be insect bite, recommended to continue treating with hydrocortisone. Itchy, redness and blistery looking rash. He reports that he was working in the yard cleaning out bushes on Wednesday, 10/6 and concern he could have come in contact with poison oak or ivy. )   HPI  [name] presents to clinic for concerns of rash on bilateral arms and abdomen x 6 days.  Reports it started after he was hauling some brush.  Was seen at Urgent Care on Friday, 03/23/2020, and diagnosed with a bug bite and given hydrocortisone cream.  Has not resolved.  Continues to itch, have redness and blisters.  Denies any blisters near his eyes, eye pain, blisters near genitalia.  Depression screen [facility] 2/9 11/25/2019 10/27/2019 08/11/2019  Decreased Interest 0 0 0  Down, Depressed, Hopeless 0 0 0  PHQ - 2 Score 0 0 0    Social History   Tobacco Use  . Smoking status: Never Smoker  . Smokeless tobacco: Never Used  Vaping Use  . Vaping Use: Never used  Substance Use Topics  . Alcohol use: No  . Drug use: No    Review of Systems  Constitutional: Negative.   HENT: Negative.   Eyes: Negative.   Respiratory: Negative.   Cardiovascular: Negative.   Gastrointestinal: Negative.   Endocrine: Negative.   Genitourinary: Negative.   Musculoskeletal: Negative.   Skin: Positive for rash.  Allergic/Immunologic: Negative.   Neurological: Negative.   Hematological: Negative.   Psychiatric/Behavioral: Negative.    Per HPI unless specifically indicated above     Objective:    BP 132/83 (BP Location: Right Arm, Patient Position: Sitting, Cuff Size: Normal)   Pulse (!) 56   Ht 6' (1.829 m)   Wt 168 lb 6.4 oz (76.4 kg)    SpO2 100%   BMI 22.84 kg/m   Wt Readings from Last 3 Encounters:  03/27/20 168 lb 6.4 oz (76.4 kg)  02/09/20 171 lb 9.6 oz (77.8 kg)  11/25/19 170 lb (77.1 kg)    Physical Exam Vitals and nursing note reviewed.  Constitutional:      General: He is not in acute distress.    Appearance: Normal appearance. He is well-developed, well-groomed and normal weight. He is not ill-appearing or toxic-appearing.  HENT:     Head: Normocephalic and atraumatic.     Nose:     Comments: Jesse Figueroa is in place, covering mouth and nose. Eyes:     General:        Right eye: No discharge.        Left eye: No discharge.     Extraocular Movements: Extraocular movements intact.     Conjunctiva/sclera: Conjunctivae normal.     Pupils: Pupils are equal, round, and reactive to light.  Cardiovascular:     Pulses: Normal pulses.  Pulmonary:     Effort: Pulmonary effort is normal. No respiratory distress.  Skin:    General: Skin is warm and dry.     Capillary Refill: Capillary refill takes less than 2 seconds.     Findings: Rash present.          Comments:  Bilateral lower arms with vesicular rash, right side of abdomen with small area of vesicles.  Neurological:     General: No focal deficit present.     Mental Status: He is alert and oriented to person, place, and time.  Psychiatric:        Attention and Perception: Attention and perception normal.        Mood and Affect: Mood and affect normal.        Speech: Speech normal.        Behavior: Behavior normal. Behavior is cooperative.        Thought Content: Thought content normal.        Cognition and Memory: Cognition and memory normal.    Results for orders placed or performed in visit on 11/16/19  TSH  Result Value Ref Range   TSH 3.15 0.40 - 4.50 mIU/L  PSA  Result Value Ref Range   PSA 0.9 < OR = 4.0 ng/mL  Lipid panel  Result Value Ref Range   Cholesterol 135 <200 mg/dL   HDL 49 > OR = 40 mg/dL   Triglycerides 59 <150 mg/dL   LDL  Cholesterol (Calc) 72 mg/dL (calc)   Total CHOL/HDL Ratio 2.8 <5.0 (calc)   Non-HDL Cholesterol (Calc) 86 <130 mg/dL (calc)  COMPLETE METABOLIC PANEL WITH GFR  Result Value Ref Range   Glucose, Bld 105 (H) 65 - 99 mg/dL   BUN 17 7 - 25 mg/dL   Creat 1.17 0.70 - 1.18 mg/dL   GFR, Est Non African American 62 > OR = 60 mL/min/1.57m2   GFR, Est African American 72 > OR = 60 mL/min/1.75m2   BUN/Creatinine Ratio NOT APPLICABLE 6 - 22 (calc)   Sodium 141 135 - 146 mmol/L   Potassium 4.5 3.5 - 5.3 mmol/L   Chloride 104 98 - 110 mmol/L   CO2 31 20 - 32 mmol/L   Calcium 9.5 8.6 - 10.3 mg/dL   Total Protein 6.8 6.1 - 8.1 g/dL   Albumin 4.2 3.6 - 5.1 g/dL   Globulin 2.6 1.9 - 3.7 g/dL (calc)   AG Ratio 1.6 1.0 - 2.5 (calc)   Total Bilirubin 0.5 0.2 - 1.2 mg/dL   Alkaline phosphatase (APISO) 99 35 - 144 U/L   AST 22 10 - 35 U/L   ALT 22 9 - 46 U/L  CBC with Differential/Platelet  Result Value Ref Range   WBC 4.1 3.8 - 10.8 Thousand/uL   RBC 5.14 4.20 - 5.80 Million/uL   Hemoglobin 14.2 13.2 - 17.1 g/dL   HCT 44.8 38 - 50 %   MCV 87.2 80.0 - 100.0 fL   MCH 27.6 27.0 - 33.0 pg   MCHC 31.7 (L) 32.0 - 36.0 g/dL   RDW 13.1 11.0 - 15.0 %   Platelets 292 140 - 400 Thousand/uL   MPV 11.1 7.5 - 12.5 fL   Neutro Abs 1,427 (L) 1,500 - 7,800 cells/uL   Lymphs Abs 1,989 850 - 3,900 cells/uL   Absolute Monocytes 455 200 - 950 cells/uL   Eosinophils Absolute 201 15 - 500 cells/uL   Basophils Absolute 29 0 - 200 cells/uL   Neutrophils Relative % 34.8 %   Total Lymphocyte 48.5 %   Monocytes Relative 11.1 %   Eosinophils Relative 4.9 %   Basophils Relative 0.7 %  Hemoglobin A1c  Result Value Ref Range   Hgb A1c MFr Bld 6.1 (H) <5.7 % of total Hgb   Mean Plasma Glucose 128 (calc)   eAG (  mmol/L) 7.1 (calc)      Assessment & Plan:   Problem List Items Addressed This Visit      Musculoskeletal and Integument   Rash and nonspecific skin eruption - Primary    Vesicular rash noted on bilateral  arms and on right side of abdomen.  Appears to be poison ivy/oak or sumac by presentation.  Will treat with oral prednisone taper x 9 days.  Educated to avoid NSAIDs while on prednisone.  Plan: 1. Begin prednisone taper over next 9 days. 2. Avoid NSAIDs while on prednisone 3. Avoid keeping vesicles/rash moist, it is better for these vesicles to stay dry as they can resolve quicker 4. RTC PRN      Relevant Medications   predniSONE (DELTASONE) 10 MG tablet     Other   Influenza vaccine needed    Pt > age 50.  Needs annual influenza vaccine.  VIS provided.  Plan: 1. Administer high dose fluzone today.       Relevant Orders   Flu Vaccine QUAD High Dose(Fluad) (Completed)      Meds ordered this encounter  Medications  . predniSONE (DELTASONE) 10 MG tablet    Sig: Take 3 tablets (30 mg total) by mouth daily with breakfast for 3 days, THEN 2 tablets (20 mg total) daily with breakfast for 3 days, THEN 1 tablet (10 mg total) daily with breakfast for 3 days.    Dispense:  18 tablet    Refill:  0    Follow up plan: Return if symptoms worsen or fail to improve.   Harlin Rain, [place] Family Nurse Practitioner [facility] 03/27/2020, 12:02 PM

## 2020-03-27 NOTE — Assessment & Plan Note (Signed)
Vesicular rash noted on bilateral arms and on right side of abdomen.  Appears to be poison ivy/oak or sumac by presentation.  Will treat with oral prednisone taper x 9 days.  Educated to avoid NSAIDs while on prednisone.  Plan: 1. Begin prednisone taper over next 9 days. 2. Avoid NSAIDs while on prednisone 3. Avoid keeping vesicles/rash moist, it is better for these vesicles to stay dry as they can resolve quicker 4. RTC PRN

## 2020-04-09 ENCOUNTER — Telehealth: Payer: Self-pay | Admitting: General Practice

## 2020-04-09 ENCOUNTER — Ambulatory Visit: Payer: Self-pay | Admitting: General Practice

## 2020-04-09 DIAGNOSIS — E7849 Other hyperlipidemia: Secondary | ICD-10-CM

## 2020-04-09 DIAGNOSIS — K5900 Constipation, unspecified: Secondary | ICD-10-CM

## 2020-04-09 DIAGNOSIS — I10 Essential (primary) hypertension: Secondary | ICD-10-CM

## 2020-04-09 DIAGNOSIS — N182 Chronic kidney disease, stage 2 (mild): Secondary | ICD-10-CM

## 2020-04-09 DIAGNOSIS — I129 Hypertensive chronic kidney disease with stage 1 through stage 4 chronic kidney disease, or unspecified chronic kidney disease: Secondary | ICD-10-CM

## 2020-04-09 NOTE — Patient Instructions (Signed)
Visit Information  Goals Addressed            This Visit's Progress    RNCM: I do want to know how to better manage my hypertension       Current Barriers:   Chronic Disease Management support, education, and care coordination needs related to HTN, HLD, CKD Stage 2, and stomach issues  COVID Vaccination concerns/questions- the patient has received both vaccines for COVID and feels safer- Had booster on 04-04-2020.  Clinical Goal(s) related to: HTN, HLD, CKD Stage 2, and stomach issues COVID Vaccination concerns/Questions Over the next 120 days, patient will:   Work with the care management team to address educational, disease management, and care coordination needs   Begin or continue self health monitoring activities as directed today Measure and record blood pressure 5 to 6 times per week and record  and monitor for factors that trigger issues related to stomach issues and concerns  Call provider office for new or worsened signs and symptoms Blood pressure findings outside established parameters, New or worsened symptom related to stomach issues, and changes in chronic disease process  Call care management team with questions or concerns  Verbalize basic understanding of patient centered plan of care established today  Receive desired COVID19 vaccination   Interventions related to HTN, HLD, CKD Stage 2, and stomach issues  Evaluation of current treatment plans and patient's adherence to plan as established by provider.  The patient verbalized he is managing his health well at this time and has not new concerns. 04-09-2020: The patient states that the rash has resolved and he is not having any other issues. He also says that his elbow pain has resolved and he did not need any therapy at all. He feels like her is doing well other than some pain and discomfort in his stomach. This is nothing new but seems to be bothering him more lately.   Assessed patient understanding of disease  states. Has good understanding of his conditions and how to effectively manage.   Assessed patient's education and care coordination needs, the patient declines assistance of the pharmacist and social worker at this time; however knows they are available to assist with any new concerns/help.  04-09-2020: The patient agrees to talk to the pharmacist about safe laxatives to use with his medication regimen. The patient states that he does have some constipation at times and his stomach is not really upset but "hurts" off and on.  Will collaborate with the pharmacist for medications reconciliation and recommendations for constipation.   Provided disease specific education to patient to assist with managing his HTN, and other chronic medical conditions. Review of a heart healthy diet and regular exercise to maintain health and well being.  02-06-2020: The patient states that he is trying to watch his dietary intake and eat healthy. Does pretty good most of the time. Praised for hemoglobin A1C of 6.1 and remaining in good control of monitoring for pre-diabetes. 04-09-2020: Is complaining of stomach pain off and on. Feels it may be because of constipation. Discussed prune juice, hydration, eating fruits and vegetables. Discussed mild OTC options as well.  Will have pharmacist reach out to review medications list and give recommendations for help with constipation.   Reviewed heart healthy diet. The patient states he is watching what he eats and his blood pressures are ranging around 960 to 454'U systolic and around 90 diastolic. Education on heart rate. In the office back in June it was low but now  the patient verbalized it is in the 60's to 70's. The patient keeps a record of it.  The patient denies any new concerns at this time.   Assessed the patients stomach issues. 04-09-2020: This is a chronic ongoing condition.  Has pain off and on. Gave ideas and recommendations. Will discuss with CCM team pharmacist.    Evaluation of new concerns. The patient verbalized he needs to come in and see Dr. Parks Ranger. He has a new complaint of shoulder pain and right elbow pain.  The patient is wearing a "sleeve" on his elbow and this helps sometimes but sometimes he has sharp pains in his elbow, especially when he is lifting the tailgate of his truck or using his right arm. The patient denies it waking him from sleep. Does want to get it checked out to see what the recommendations of the pcp are. 04-09-2020: Denies any new concerns with the shoulder and elbow pain. States they are stable at this time.   Collaborated with appropriate clinical care team members regarding patient needs.  No new needs at this time.   Assessed upcoming appointments with pcp: Last appointment with the pcp was 03-27-2020.  The patient knows to call the office for changes or needs.   Patient Self Care Activities related to HTN, HLD, CKD Stage 2, and stomach issues  Patient is unable to independently self-manage chronic health conditions  Please see past updates related to this goal by clicking on the "Past Updates" button in the selected goal         Patient verbalizes understanding of instructions provided today.   Telephone follow up appointment with care management team member scheduled for: 06-04-2020 at 10:30 am  Noreene Larsson RN, MSN, [place] [facility] Mobile: [phone]

## 2020-04-09 NOTE — Chronic Care Management (AMB) (Signed)
Chronic Care Management   Follow Up Note   04/09/2020 Name: Jesse Figueroa MRN: [id number] DOB: 28-Oct-1950  Referred by: Olin Hauser, DO Reason for referral : Chronic Care Management (RNCM Follow up call for Chronic Disease Management and Care Coordination Needs)   Jesse Figueroa is a 69 y.o. year old male who is a primary care patient of Olin Hauser, DO. The CCM team was consulted for assistance with chronic disease management and care coordination needs.    Review of patient status, including review of consultants reports, relevant laboratory and other test results, and collaboration with appropriate care team members and the patient's provider was performed as part of comprehensive patient evaluation and provision of chronic care management services.    SDOH (Social Determinants of Health) assessments performed: Yes See Care Plan activities for detailed interventions related to [facility])     Outpatient Encounter Medications as of 04/09/2020  Medication Sig  . aspirin 81 MG chewable tablet Chew by mouth.  Marland Kitchen atorvastatin (LIPITOR) 10 MG tablet TAKE 1 TABLET DAILY AT 6 PM.  . baclofen (LIORESAL) 10 MG tablet TAKE 0.5-1 TABLETS (5-10 MG TOTAL) BY MOUTH 2 (TWO) TIMES DAILY AS NEEDED FOR MUSCLE SPASMS.  . fluticasone (FLONASE) 50 MCG/ACT nasal spray USE 2 SPRAYS IN BOTH NOSTRILS DAILY. USE FOR 4 TO 6 WEEKS THEN STOP AND USE SEASONALLY OR AS NEEDED.  Marland Kitchen losartan (COZAAR) 100 MG tablet TAKE 1 TABLET EVERY DAY  . neomycin-polymyxin b-dexamethasone (MAXITROL) 3.5-10000-0.1 SUSP  (Patient not taking: Reported on 03/27/2020)  . propranolol (INDERAL) 20 MG tablet TAKE 1 TABLET TWICE DAILY   No facility-administered encounter medications on file as of 04/09/2020.     Objective:  BP Readings from Last 3 Encounters:  03/27/20 132/83  02/09/20 122/70  11/25/19 125/79    Goals Addressed            This Visit's Progress   . RNCM: I do want to know how to better  manage my hypertension       Current Barriers:  . Chronic Disease Management support, education, and care coordination needs related to HTN, HLD, CKD Stage 2, and stomach issues . COVID Vaccination concerns/questions- the patient has received both vaccines for COVID and feels safer- Had booster on 04-04-2020.  Clinical Goal(s) related to: HTN, HLD, CKD Stage 2, and stomach issues COVID Vaccination concerns/Questions Over the next 120 days, patient will:  . Work with the care management team to address educational, disease management, and care coordination needs  . Begin or continue self health monitoring activities as directed today Measure and record blood pressure 5 to 6 times per week and record  and monitor for factors that trigger issues related to stomach issues and concerns . Call provider office for new or worsened signs and symptoms Blood pressure findings outside established parameters, New or worsened symptom related to stomach issues, and changes in chronic disease process . Call care management team with questions or concerns . Verbalize basic understanding of patient centered plan of care established today . Receive desired COVID19 vaccination   Interventions related to HTN, HLD, CKD Stage 2, and stomach issues . Evaluation of current treatment plans and patient's adherence to plan as established by provider.  The patient verbalized he is managing his health well at this time and has not new concerns. 04-09-2020: The patient states that the rash has resolved and he is not having any other issues. He also says that his elbow pain has resolved and  he did not need any therapy at all. He feels like her is doing well other than some pain and discomfort in his stomach. This is nothing new but seems to be bothering him more lately.  . Assessed patient understanding of disease states. Has good understanding of his conditions and how to effectively manage.  . Assessed patient's education and  care coordination needs, the patient declines assistance of the pharmacist and social worker at this time; however knows they are available to assist with any new concerns/help.  04-09-2020: The patient agrees to talk to the pharmacist about safe laxatives to use with his medication regimen. The patient states that he does have some constipation at times and his stomach is not really upset but "hurts" off and on.  Will collaborate with the pharmacist for medications reconciliation and recommendations for constipation.  . Provided disease specific education to patient to assist with managing his HTN, and other chronic medical conditions. Review of a heart healthy diet and regular exercise to maintain health and well being.  02-06-2020: The patient states that he is trying to watch his dietary intake and eat healthy. Does pretty good most of the time. Praised for hemoglobin A1C of 6.1 and remaining in good control of monitoring for pre-diabetes. 04-09-2020: Is complaining of stomach pain off and on. Feels it may be because of constipation. Discussed prune juice, hydration, eating fruits and vegetables. Discussed mild OTC options as well.  Will have pharmacist reach out to review medications list and give recommendations for help with constipation.  . Reviewed heart healthy diet. The patient states he is watching what he eats and his blood pressures are ranging around 833 to 825'K systolic and around 90 diastolic. Education on heart rate. In the office back in June it was low but now the patient verbalized it is in the 60's to 70's. The patient keeps a record of it.  The patient denies any new concerns at this time.  . Assessed the patients stomach issues. 04-09-2020: This is a chronic ongoing condition.  Has pain off and on. Gave ideas and recommendations. Will discuss with CCM team pharmacist.  . Evaluation of new concerns. The patient verbalized he needs to come in and see Dr. Parks Ranger. He has a new complaint  of shoulder pain and right elbow pain.  The patient is wearing a "sleeve" on his elbow and this helps sometimes but sometimes he has sharp pains in his elbow, especially when he is lifting the tailgate of his truck or using his right arm. The patient denies it waking him from sleep. Does want to get it checked out to see what the recommendations of the pcp are. 04-09-2020: Denies any new concerns with the shoulder and elbow pain. States they are stable at this time.  Nash Dimmer with appropriate clinical care team members regarding patient needs.  No new needs at this time.  . Assessed upcoming appointments with pcp: Last appointment with the pcp was 03-27-2020.  The patient knows to call the office for changes or needs.   Patient Self Care Activities related to HTN, HLD, CKD Stage 2, and stomach issues . Patient is unable to independently self-manage chronic health conditions  Please see past updates related to this goal by clicking on the "Past Updates" button in the selected goal          Plan:   Telephone follow up appointment with care management team member scheduled for: 06-04-2020 at 10:30 am   Noreene Larsson RN,  MSN, [place] Mobile: [phone]

## 2020-04-20 ENCOUNTER — Ambulatory Visit (INDEPENDENT_AMBULATORY_CARE_PROVIDER_SITE_OTHER): Payer: Medicare HMO | Admitting: Pharmacist

## 2020-04-20 DIAGNOSIS — E7849 Other hyperlipidemia: Secondary | ICD-10-CM | POA: Diagnosis not present

## 2020-04-20 DIAGNOSIS — I129 Hypertensive chronic kidney disease with stage 1 through stage 4 chronic kidney disease, or unspecified chronic kidney disease: Secondary | ICD-10-CM | POA: Diagnosis not present

## 2020-04-20 DIAGNOSIS — N182 Chronic kidney disease, stage 2 (mild): Secondary | ICD-10-CM | POA: Diagnosis not present

## 2020-04-20 NOTE — Chronic Care Management (AMB) (Signed)
Chronic Care Management   Note  04/20/2020 Name: Jesse Figueroa MRN: [id number] DOB: 15-Feb-1951   Subjective:   Jesse Figueroa is a 69 y.o. year old male who is a primary care patient of Olin Hauser, DO. The CM team was consulted for assistance with chronic disease management and care coordination.   Receive referral from CCM Nurse Case Manager to reach out for medication review and address questions regarding constipation.  I reached out to Frederico Hamman by phone today.   Review of patient status, including review of consultants reports, laboratory and other test data, was performed as part of comprehensive evaluation and provision of chronic care management services.   SDOH (Social Determinants of Health) screening performed today: Physical Activity. See Care Plan for related entries.   Objective:  Lab Results  Component Value Date   CREATININE 1.17 11/16/2019   CREATININE 1.20 03/28/2019   CREATININE 1.22 (H) 11/23/2018    Lab Results  Component Value Date   HGBA1C 6.1 (H) 11/16/2019       Component Value Date/Time   CHOL 135 11/16/2019 0817   CHOL 121 10/15/2015 0914   TRIG 59 11/16/2019 0817   HDL 49 11/16/2019 0817   HDL 47 10/15/2015 0914   CHOLHDL 2.8 11/16/2019 0817   VLDL 24 08/28/2016 0001   LDLCALC 72 11/16/2019 0817    ASCVD Risk: The 10-year ASCVD risk score Mikey Bussing DC Jr., et al., 2013) is: 32.6%   Values used to calculate the score:     Age: 37 years     Sex: Male     Is Non-Hispanic African American: Yes     Diabetic: Yes     Tobacco smoker: No     Systolic Blood Pressure: 735 mmHg     Is BP treated: Yes     HDL Cholesterol: 49 mg/dL     Total Cholesterol: 135 mg/dL    BP Readings from Last 3 Encounters:  03/27/20 132/83  02/09/20 122/70  11/25/19 125/79    No Known Allergies  Medications Reviewed Today    Reviewed by Vella Raring, [place] (Pharmacist) on 04/20/20 at 50  Med List Status: <None>  Medication Order  Taking? Sig Documenting Provider Last Dose Status Informant  aspirin 81 MG chewable tablet [id number] Yes Chew by mouth. [provider] Taking Active   atorvastatin (LIPITOR) 10 MG tablet [id number] Yes TAKE 1 TABLET DAILY AT 6 PM. Parks Ranger, Devonne Doughty, DO Taking Active   baclofen (LIORESAL) 10 MG tablet [id number] No TAKE 0.5-1 TABLETS (5-10 MG TOTAL) BY MOUTH 2 (TWO) TIMES DAILY AS NEEDED FOR MUSCLE SPASMS.  Patient not taking: Reported on 04/20/2020   Olin Hauser, DO Not Taking Active   fluticasone [facility]) 50 MCG/ACT nasal spray [id number] Yes USE 2 SPRAYS IN BOTH NOSTRILS DAILY. USE FOR 4 TO 6 WEEKS THEN STOP AND USE SEASONALLY OR AS NEEDED. Olin Hauser, DO Taking Active   losartan (COZAAR) 100 MG tablet [id number] Yes TAKE 1 TABLET EVERY DAY Olin Hauser, DO Taking Active        Patient not taking:      Discontinued 04/20/20 1247 (Completed Course)   propranolol (INDERAL) 20 MG tablet [id number] Yes TAKE 1 TABLET TWICE DAILY Olin Hauser, DO Taking Active            Assessment:   Goals Addressed            This Visit's Progress   . COMPLETED: PharmD - Medication  Review       CARE PLAN ENTRY (see longitudinal plan of care for additional care plan information)   Current Barriers:  . Chronic Disease Management support, education, and care coordination needs related to HLD, HTN, pre-diabetes  Pharmacist Clinical Goal(s):  Marland Kitchen Over the next 30 days, patient will work with CM Pharmacist to complete medication review and address needs identified  Interventions: . Provider and Inter-disciplinary care team collaboration (see longitudinal plan of care) o Receive referral from CCM Nurse Case Manager to reach out for medication review and address questions regarding constipation . Patient reports has stomach discomfort on-and-off, typically for ~3 days and then goes away, but states is not constipation. o Patient reports, and  see per review of chart, he has been seen in past by [facility] and had MRI for evaluation of this discomfort - Denies need for further follow up with providers at this time o Reports believes symptoms to be connected to his diet. Reports worsened by eating pork, juice and carbonated beverages and improved since has minimized these in his diet o Reports relieved sometimes by bowel movements o Reports uses Tums as needed for acid - Discuss that Tums can contribute to constipation; patient reports using only ~ once/week o Reports has improved hydration by drinking water in place of juice and other beverages o Reports an active lifestyle, including riding stationary bike ~30 minutes several days/week, high school track coach, walking, mowing lawn o Counsel on dietary sources of fiber and will mail patient handout on dietary fiber as requested . Comprehensive medication review performed; medication list updated in electronic medical record . Discuss blood pressure control and monitoring BP Readings from Last 3 Encounters:  03/27/20 132/83  02/09/20 122/70  11/25/19 125/79 o  Reports not keeping record of home BP results as does not believe home monitor is accurate - Reports has upper arm BP monitor that is >35 year old - Counsel patient on health plan OTC benefit - will consider obtaining new upper arm monitor from benefit o Encourage patient to monitor home BP, keep log of results and bring log with him to medical appointments o Discuss blood pressure monitoring technique and will mail patient blood pressure log and handout on BP monitoring . Patient denies further medication questions/concerns . Provide patient with contact information for CM Pharmacist . Collaborate with CM Nurse Case Manager  Patient Self Care Activities:  . Self administers medications as prescribed . Attends all scheduled provider appointments  Initial goal documentation        Plan:  The patient has been  provided with contact information for CM Pharmacist and has been advised to call with any medication related questions or concerns.   Harlow Asa, PharmD, [place] [employer] [phone]

## 2020-04-20 NOTE — Patient Instructions (Addendum)
Thank you allowing the Chronic Care Management Team to be a part of your care! It was a pleasure speaking with you today!     CCM (Chronic Care Management) Team    Noreene Larsson RN, MSN, CCM Nurse Care Coordinator  [phone]   Harlow Asa PharmD  Clinical Pharmacist  [phone]   Eula Fried LCSW Clinical Social Worker [phone]  Visit Information  Goals Addressed            This Visit's Progress   . COMPLETED: PharmD - Medication Review       CARE PLAN ENTRY (see longitudinal plan of care for additional care plan information)   Current Barriers:  . Chronic Disease Management support, education, and care coordination needs related to HLD, HTN, pre-diabetes  Pharmacist Clinical Goal(s):  Marland Kitchen Over the next 30 days, patient will work with CM Pharmacist to complete medication review and address needs identified  Interventions: . Provider and Inter-disciplinary care team collaboration (see longitudinal plan of care) o Receive referral from CCM Nurse Case Manager to reach out for medication review and address questions regarding constipation . Patient reports has stomach discomfort on-and-off, typically for ~3 days and then goes away, but states is not constipation. o Patient reports, and see per review of chart, he has been seen in past by [facility] and had MRI for evaluation of this discomfort - Denies need for further follow up with providers at this time o Reports believes symptoms to be connected to his diet. Reports worsened by eating pork, juice and carbonated beverages and improved since has minimized these in his diet o Reports relieved sometimes by bowel movements o Reports uses Tums as needed for acid - Discuss that Tums can contribute to constipation; patient reports using only ~ once/week o Reports has improved hydration by drinking water in place of juice and other beverages o Reports an active lifestyle, including riding stationary bike ~30  minutes several days/week, high school track coach, walking, mowing lawn o Counsel on dietary sources of fiber and will mail patient handout on dietary fiber as requested . Comprehensive medication review performed; medication list updated in electronic medical record . Discuss blood pressure control and monitoring BP Readings from Last 3 Encounters:  03/27/20 132/83  02/09/20 122/70  11/25/19 125/79 o  Reports not keeping record of home BP results as does not believe home monitor is accurate - Reports has upper arm BP monitor that is >46 year old - Counsel patient on health plan OTC benefit - will consider obtaining new upper arm monitor from benefit o Encourage patient to monitor home BP, keep log of results and bring log with him to medical appointments o Discuss blood pressure monitoring technique and will mail patient blood pressure log and handout on BP monitoring . Patient denies further medication questions/concerns . Provide patient with contact information for CM Pharmacist  Patient Self Care Activities:  . Self administers medications as prescribed . Attends all scheduled provider appointments  Initial goal documentation         Print copy of patient instructions provided.   The patient has been provided with contact information for CM Pharmacist and has been advised to call with any medication questions or concerns.   Harlow Asa, PharmD, [facility] Clinical Pharmacist [place] [phone]  Constipation, Adult Constipation is when a person has fewer bowel movements in a week than normal, has difficulty having a bowel movement, or has stools that are dry, hard, or  larger than normal. Constipation may be caused by an underlying condition. It may become worse with age if a person takes certain medicines and does not take in enough fluids. Follow these instructions at home: Eating and drinking   Eat foods that have a lot of  fiber, such as fresh fruits and vegetables, whole grains, and beans.  Limit foods that are high in fat, low in fiber, or overly processed, such as french fries, hamburgers, cookies, candies, and soda.  Drink enough fluid to keep your urine clear or pale yellow. General instructions  Exercise regularly or as told by your health care provider.  Go to the restroom when you have the urge to go. Do not hold it in.  Take over-the-counter and prescription medicines only as told by your health care provider. These include any fiber supplements.  Practice pelvic floor retraining exercises, such as deep breathing while relaxing the lower abdomen and pelvic floor relaxation during bowel movements.  Watch your condition for any changes.  Keep all follow-up visits as told by your health care provider. This is important. Contact a health care provider if:  You have pain that gets worse.  You have a fever.  You do not have a bowel movement after 4 days.  You vomit.  You are not hungry.  You lose weight.  You are bleeding from the anus.  You have thin, pencil-like stools. Get help right away if:  You have a fever and your symptoms suddenly get worse.  You leak stool or have blood in your stool.  Your abdomen is bloated.  You have severe pain in your abdomen.  You feel dizzy or you faint. This information is not intended to replace advice given to you by your health care provider. Make sure you discuss any questions you have with your health care provider. Document Revised: 05/15/2017 Document Reviewed: 11/21/2015 Elsevier Patient Education  [place].  Cardinal Health Content in Foods  See the following list for the dietary fiber content of some common foods. High-fiber foods High-fiber foods contain 4 grams or more (4g or more) of fiber per serving. They include:  Artichoke (fresh) -- 1 medium has 10.3g of fiber.  Baked beans, plain or vegetarian (canned) --  cup has 5.2g  of fiber.  Blackberries or raspberries (fresh) --  cup has 4g of fiber.  Bran cereal --  cup has 8.6g of fiber.  Bulgur (cooked) --  cup has 4g of fiber.  Kidney beans (canned) --  cup has 6.8g of fiber.  Lentils (cooked) --  cup has 7.8g of fiber.  Pear (fresh) -- 1 medium has 5.1g of fiber.  Peas (frozen) --  cup has 4.4g of fiber.  Pinto beans (canned) --  cup has 5.5g of fiber.  Pinto beans (dried and cooked) --  cup has 7.7g of fiber.  Potato with skin (baked) -- 1 medium has 4.4g of fiber.  Quinoa (cooked) --  cup has 5g of fiber.  Soybeans (canned, frozen, or fresh) --  cup has 5.1g of fiber. Moderate-fiber foods Moderate-fiber foods contain 1-4 grams (1-4g) of fiber per serving. They include:  Almonds -- 1 oz. has 3.5g of fiber.  Apple with skin -- 1 medium has 3.3g of fiber.  Applesauce, sweetened --  cup has 1.5g of fiber.  Bagel, plain -- one 4-inch (10-cm) bagel has 2g of fiber.  Banana -- 1 medium has 3.1g of fiber.  Broccoli (cooked) --  cup has 2.5g of fiber.  Carrots (  cooked) --  cup has 2.3g of fiber.  Corn (canned or frozen) --  cup has 2.1g of fiber.  Corn tortilla -- one 6-inch (15-cm) tortilla has 1.5g of fiber.  Green beans (canned) --  cup has 2g of fiber.  Instant oatmeal --  cup has about 2g of fiber.  Long-grain brown rice (cooked) -- 1 cup has 3.5g of fiber.  Macaroni, enriched (cooked) -- 1 cup has 2.5g of fiber.  Melon -- 1 cup has 1.4g of fiber.  Multigrain cereal --  cup has about 2-4g of fiber.  Orange -- 1 small has 3.1g of fiber.  Potatoes, mashed --  cup has 1.6g of fiber.  Raisins -- 1/4 cup has 1.6g of fiber.  Squash --  cup has 2.9g of fiber.  Sunflower seeds --  cup has 1.1g of fiber.  Tomato -- 1 medium has 1.5g of fiber.  Vegetable or soy patty -- 1 has 3.4g of fiber.  Whole-wheat bread -- 1 slice has 2g of fiber.  Whole-wheat spaghetti --  cup has 3.2g of fiber. Low-fiber  foods Low-fiber foods contain less than 1 gram (less than 1g) of fiber per serving. They include:  Egg -- 1 large.  Flour tortilla -- one 6-inch (15-cm) tortilla.  Fruit juice --  cup.  Lettuce -- 1 cup.  Meat, poultry, or fish -- 1 oz.  Milk -- 1 cup.  Spinach (raw) -- 1 cup.  White bread -- 1 slice.  White rice --  cup.  Yogurt --  cup. Actual amounts of fiber in foods may be different depending on processing. Talk with your dietitian about how much fiber you need in your diet. This information is not intended to replace advice given to you by your health care provider. Make sure you discuss any questions you have with your health care provider. Document Revised: 01/24/2016 Document Reviewed: 07/26/2015 Elsevier Patient Education  [place].

## 2020-05-08 DIAGNOSIS — H527 Unspecified disorder of refraction: Secondary | ICD-10-CM | POA: Diagnosis not present

## 2020-05-08 DIAGNOSIS — H2513 Age-related nuclear cataract, bilateral: Secondary | ICD-10-CM | POA: Diagnosis not present

## 2020-05-08 DIAGNOSIS — H04123 Dry eye syndrome of bilateral lacrimal glands: Secondary | ICD-10-CM | POA: Diagnosis not present

## 2020-05-08 DIAGNOSIS — H40023 Open angle with borderline findings, high risk, bilateral: Secondary | ICD-10-CM | POA: Diagnosis not present

## 2020-05-19 ENCOUNTER — Other Ambulatory Visit: Payer: Self-pay | Admitting: Family Medicine

## 2020-05-19 DIAGNOSIS — I10 Essential (primary) hypertension: Secondary | ICD-10-CM

## 2020-05-19 NOTE — Telephone Encounter (Signed)
Requested Prescriptions  Pending Prescriptions Disp Refills   losartan (COZAAR) 100 MG tablet [Pharmacy Med Name: LOSARTAN POTASSIUM 100 MG Tablet] 90 tablet 0    Sig: TAKE 1 TABLET EVERY DAY     Cardiovascular:  Angiotensin Receptor Blockers Failed - 05/19/2020  1:29 PM      Failed - Cr in normal range and within 180 days    Creat  Date Value Ref Range Status  11/16/2019 1.17 0.70 - 1.18 mg/dL Final    Comment:    For patients >71 years of age, the reference limit for Creatinine is approximately 13% higher for people identified as African-American. .          Failed - K in normal range and within 180 days    Potassium  Date Value Ref Range Status  11/16/2019 4.5 3.5 - 5.3 mmol/L Final         Passed - Patient is not pregnant      Passed - Last BP in normal range    BP Readings from Last 1 Encounters:  03/27/20 132/83         Passed - Valid encounter within last 6 months    Recent Outpatient Visits          1 month ago Rash and nonspecific skin eruption   [facility], Lupita Raider, FNP   3 months ago Pain in right elbow   [place], DO   5 months ago Annual physical exam   [facility] Olin Hauser, DO   9 months ago External hemorrhoid   [place], DO   9 months ago Benign hypertension with CKD (chronic kidney disease), stage II   [place], DO

## 2020-06-04 ENCOUNTER — Telehealth: Payer: Self-pay | Admitting: General Practice

## 2020-06-04 ENCOUNTER — Ambulatory Visit: Payer: Self-pay | Admitting: General Practice

## 2020-06-04 DIAGNOSIS — E7849 Other hyperlipidemia: Secondary | ICD-10-CM

## 2020-06-04 DIAGNOSIS — I10 Essential (primary) hypertension: Secondary | ICD-10-CM

## 2020-06-04 DIAGNOSIS — N182 Chronic kidney disease, stage 2 (mild): Secondary | ICD-10-CM

## 2020-06-04 DIAGNOSIS — M778 Other enthesopathies, not elsewhere classified: Secondary | ICD-10-CM

## 2020-06-04 DIAGNOSIS — I129 Hypertensive chronic kidney disease with stage 1 through stage 4 chronic kidney disease, or unspecified chronic kidney disease: Secondary | ICD-10-CM

## 2020-06-04 NOTE — Patient Instructions (Signed)
Visit Information  Goals Addressed            This Visit's Progress   . RNCM: I do want to know how to better manage my hypertension       Current Barriers:  . Chronic Disease Management support, education, and care coordination needs related to HTN, HLD, CKD Stage 2, and stomach issues . COVID Vaccination concerns/questions- the patient has received both vaccines for COVID and feels safer- Had booster on 04-04-2020.  Clinical Goal(s) related to: HTN, HLD, CKD Stage 2, and stomach issues COVID Vaccination concerns/Questions Over the next 120 days, patient will:  . Work with the care management team to address educational, disease management, and care coordination needs  . Begin or continue self health monitoring activities as directed today Measure and record blood pressure 5 to 6 times per week and record  and monitor for factors that trigger issues related to stomach issues and concerns . Call provider office for new or worsened signs and symptoms Blood pressure findings outside established parameters, New or worsened symptom related to stomach issues, and changes in chronic disease process . Call care management team with questions or concerns . Verbalize basic understanding of patient centered plan of care established today . Receive desired COVID19 vaccination   Interventions related to HTN, HLD, CKD Stage 2, and stomach issues . Evaluation of current treatment plans and patient's adherence to plan as established by provider.  The patient verbalized he is managing his health well at this time and has not new concerns. 06-04-2020: The patient states he is doing well and denies any new concerns. States he had a good appointment with pcp.  The patient states right now his shoulder and elbow pain has resolved.  He says he is doing well.  . Assessed patient understanding of disease states. Has good understanding of his conditions and how to effectively manage.  . Assessed patient's  education and care coordination needs, the patient declines assistance of the pharmacist and social worker at this time; however knows they are available to assist with any new concerns/help.  04-09-2020: The patient agrees to talk to the pharmacist about safe laxatives to use with his medication regimen. The patient states that he does have some constipation at times and his stomach is not really upset but "hurts" off and on.  Will collaborate with the pharmacist for medications reconciliation and recommendations for constipation. 06-04-2020: The patient denies any educational needs at this time. The patient states that he knows when to call for changes.  . Provided disease specific education to patient to assist with managing his HTN, and other chronic medical conditions. Review of a heart healthy diet and regular exercise to maintain health and well being.  02-06-2020: The patient states that he is trying to watch his dietary intake and eat healthy. Does pretty good most of the time. Praised for hemoglobin A1C of 6.1 and remaining in good control of monitoring for pre-diabetes. 04-09-2020: Is complaining of stomach pain off and on. Feels it may be because of constipation. Discussed prune juice, hydration, eating fruits and vegetables. Discussed mild OTC options as well.  Will have pharmacist reach out to review medications list and give recommendations for help with constipation. 06-04-2020: The patient denies any issues with stomach pain at this time. Feels he is doing well managing his health and well being.  . Reviewed heart healthy diet. The patient states he is watching what he eats and his blood pressures are ranging around 129  to 109'N systolic and around 90 diastolic. Education on heart rate. In the office back in June it was low but now the patient verbalized it is in the 60's to 70's. The patient keeps a record of it.  The patient denies any new concerns at this time.  . Assessed the patients stomach  issues. 04-09-2020: This is a chronic ongoing condition.  Has pain off and on. Gave ideas and recommendations. Will discuss with CCM team pharmacist. 06-04-2020: States stomach issues have resolved currently.  . Evaluation of new concerns. The patient verbalized he needs to come in and see Dr. Parks Ranger. He has a new complaint of shoulder pain and right elbow pain.  The patient is wearing a "sleeve" on his elbow and this helps sometimes but sometimes he has sharp pains in his elbow, especially when he is lifting the tailgate of his truck or using his right arm. The patient denies it waking him from sleep. Does want to get it checked out to see what the recommendations of the pcp are. 06-04-2020: Denies any new concerns with the shoulder and elbow pain. States they are stable at this time.  Nash Dimmer with appropriate clinical care team members regarding patient needs.  No new needs at this time.  . Assessed upcoming appointments with pcp: Last appointment with the pcp was 05-08-2020.  The patient knows to call the office for changes or needs.   Patient Self Care Activities related to HTN, HLD, CKD Stage 2, and stomach issues . Patient is unable to independently self-manage chronic health conditions  Please see past updates related to this goal by clicking on the "Past Updates" button in the selected goal         The patient verbalized understanding of instructions, educational materials, and care plan provided today and declined offer to receive copy of patient instructions, educational materials, and care plan.   Telephone follow up appointment with care management team member scheduled for: 08-06-2020 at 10:30 am  [place], MSN, [place] Mobile: [phone]

## 2020-06-04 NOTE — Chronic Care Management (AMB) (Signed)
Chronic Care Management   Follow Up Note   06/04/2020 Name: SEVAN MCBROOM MRN: [id number] DOB: 1950-12-02  Referred by: Olin Hauser, DO Reason for referral : Chronic Care Management (RNCM: Chronic Disease Management and Care Coordination Needs)   SHAHAB POLHAMUS is a 69 y.o. year old male who is a primary care patient of Olin Hauser, DO. The CCM team was consulted for assistance with chronic disease management and care coordination needs.    Review of patient status, including review of consultants reports, relevant laboratory and other test results, and collaboration with appropriate care team members and the patient's provider was performed as part of comprehensive patient evaluation and provision of chronic care management services.    SDOH (Social Determinants of Health) assessments performed: Yes See Care Plan activities for detailed interventions related to [facility])     Outpatient Encounter Medications as of 06/04/2020  Medication Sig Note  . aspirin 81 MG chewable tablet Chew by mouth.   Marland Kitchen atorvastatin (LIPITOR) 10 MG tablet TAKE 1 TABLET DAILY AT 6 PM.   . baclofen (LIORESAL) 10 MG tablet TAKE 0.5-1 TABLETS (5-10 MG TOTAL) BY MOUTH 2 (TWO) TIMES DAILY AS NEEDED FOR MUSCLE SPASMS. (Patient not taking: Reported on 04/20/2020)   . calcium carbonate (TUMS - DOSED IN MG ELEMENTAL CALCIUM) 500 MG chewable tablet Chew 1 tablet by mouth daily as needed for indigestion or heartburn. 04/20/2020: Reports using ~ once/week  . fluticasone (FLONASE) 50 MCG/ACT nasal spray USE 2 SPRAYS IN BOTH NOSTRILS DAILY. USE FOR 4 TO 6 WEEKS THEN STOP AND USE SEASONALLY OR AS NEEDED.   Marland Kitchen losartan (COZAAR) 100 MG tablet TAKE 1 TABLET EVERY DAY   . propranolol (INDERAL) 20 MG tablet TAKE 1 TABLET TWICE DAILY    No facility-administered encounter medications on file as of 06/04/2020.     Objective:   Goals Addressed            This Visit's Progress   . RNCM: I do want to know  how to better manage my hypertension       Current Barriers:  . Chronic Disease Management support, education, and care coordination needs related to HTN, HLD, CKD Stage 2, and stomach issues . COVID Vaccination concerns/questions- the patient has received both vaccines for COVID and feels safer- Had booster on 04-04-2020.  Clinical Goal(s) related to: HTN, HLD, CKD Stage 2, and stomach issues COVID Vaccination concerns/Questions Over the next 120 days, patient will:  . Work with the care management team to address educational, disease management, and care coordination needs  . Begin or continue self health monitoring activities as directed today Measure and record blood pressure 5 to 6 times per week and record  and monitor for factors that trigger issues related to stomach issues and concerns . Call provider office for new or worsened signs and symptoms Blood pressure findings outside established parameters, New or worsened symptom related to stomach issues, and changes in chronic disease process . Call care management team with questions or concerns . Verbalize basic understanding of patient centered plan of care established today . Receive desired COVID19 vaccination   Interventions related to HTN, HLD, CKD Stage 2, and stomach issues . Evaluation of current treatment plans and patient's adherence to plan as established by provider.  The patient verbalized he is managing his health well at this time and has not new concerns. 06-04-2020: The patient states he is doing well and denies any new concerns. States he had a  good appointment with pcp.  The patient states right now his shoulder and elbow pain has resolved.  He says he is doing well.  . Assessed patient understanding of disease states. Has good understanding of his conditions and how to effectively manage.  . Assessed patient's education and care coordination needs, the patient declines assistance of the pharmacist and social worker at  this time; however knows they are available to assist with any new concerns/help.  04-09-2020: The patient agrees to talk to the pharmacist about safe laxatives to use with his medication regimen. The patient states that he does have some constipation at times and his stomach is not really upset but "hurts" off and on.  Will collaborate with the pharmacist for medications reconciliation and recommendations for constipation. 06-04-2020: The patient denies any educational needs at this time. The patient states that he knows when to call for changes.  . Provided disease specific education to patient to assist with managing his HTN, and other chronic medical conditions. Review of a heart healthy diet and regular exercise to maintain health and well being.  02-06-2020: The patient states that he is trying to watch his dietary intake and eat healthy. Does pretty good most of the time. Praised for hemoglobin A1C of 6.1 and remaining in good control of monitoring for pre-diabetes. 04-09-2020: Is complaining of stomach pain off and on. Feels it may be because of constipation. Discussed prune juice, hydration, eating fruits and vegetables. Discussed mild OTC options as well.  Will have pharmacist reach out to review medications list and give recommendations for help with constipation. 06-04-2020: The patient denies any issues with stomach pain at this time. Feels he is doing well managing his health and well being.  . Reviewed heart healthy diet. The patient states he is watching what he eats and his blood pressures are ranging around 397 to 673'A systolic and around 90 diastolic. Education on heart rate. In the office back in June it was low but now the patient verbalized it is in the 60's to 70's. The patient keeps a record of it.  The patient denies any new concerns at this time.  . Assessed the patients stomach issues. 04-09-2020: This is a chronic ongoing condition.  Has pain off and on. Gave ideas and recommendations.  Will discuss with CCM team pharmacist. 06-04-2020: States stomach issues have resolved currently.  . Evaluation of new concerns. The patient verbalized he needs to come in and see Dr. Parks Ranger. He has a new complaint of shoulder pain and right elbow pain.  The patient is wearing a "sleeve" on his elbow and this helps sometimes but sometimes he has sharp pains in his elbow, especially when he is lifting the tailgate of his truck or using his right arm. The patient denies it waking him from sleep. Does want to get it checked out to see what the recommendations of the pcp are. 06-04-2020: Denies any new concerns with the shoulder and elbow pain. States they are stable at this time.  Nash Dimmer with appropriate clinical care team members regarding patient needs.  No new needs at this time.  . Assessed upcoming appointments with pcp: Last appointment with the pcp was 05-08-2020.  The patient knows to call the office for changes or needs.   Patient Self Care Activities related to HTN, HLD, CKD Stage 2, and stomach issues . Patient is unable to independently self-manage chronic health conditions  Please see past updates related to this goal by clicking on the "  Past Updates" button in the selected goal           Plan:   Telephone follow up appointment with care management team member scheduled for: 08-06-2020 at 10:30 am   [place], MSN, [place] Mobile: [phone]

## 2020-08-01 ENCOUNTER — Other Ambulatory Visit: Payer: Self-pay | Admitting: Family Medicine

## 2020-08-01 DIAGNOSIS — I10 Essential (primary) hypertension: Secondary | ICD-10-CM

## 2020-08-06 ENCOUNTER — Telehealth: Payer: Self-pay

## 2020-08-06 ENCOUNTER — Telehealth: Payer: Self-pay | Admitting: General Practice

## 2020-08-06 NOTE — Telephone Encounter (Signed)
  Chronic Care Management   Outreach Note  08/06/2020 Name: Jesse Figueroa MRN: [id number] DOB: 03-10-1951  Referred by: Olin Hauser, DO Reason for referral : Appointment (RNCM: Follow up for Chronic Disease Management and Care Coordination Needs )   An unsuccessful telephone outreach was attempted today. The patient was referred to the case management team for assistance with care management and care coordination. The patients wife states the patient is not there at this time. Will let the patient know the RNCM called.     Follow Up Plan: Telephone follow up appointment with care management team member scheduled for: 09-10-2020 at 0900 am  Noreene Larsson RN, MSN, [place] Mobile: [phone]

## 2020-09-03 ENCOUNTER — Ambulatory Visit: Payer: Self-pay | Admitting: *Deleted

## 2020-09-03 NOTE — Telephone Encounter (Signed)
  C/o numbness in bilateral fingers and toes for a while now. Unable to give time when last normal or when symptoms started. Reports numbness in fingers and toes usually starts at night and when sitting for long periods of time. Joints are a little tight at times. Numbness comes and goes not every day. Reports numbness in right middle finger noted x 2 minutes. Patient attempts to move fingers and toes to decrease numbness . appt scheduled for 09/04/20. Care advise given. Patient verbalized understanding of care advise and to call back or go to ED if symptoms worsen.   Reason for Disposition . [1] Numbness or tingling on both sides of body AND [2] is a new symptom present < 24 hours  Answer Assessment - Initial Assessment Questions 1. SYMPTOM: "What is the main symptom you are concerned about?" (e.g., weakness, numbness)     Numbness in fingers and toes  2. ONSET: "When did this start?" (minutes, hours, days; while sleeping)     Not sure how long ago  3. LAST NORMAL: "When was the last time you were normal (no symptoms)?"     Normal today but numbness happens mostly at night 4. PATTERN "Does this come and go, or has it been constant since it started?"  "Is it present now?"     Comes and goes  5. CARDIAC SYMPTOMS: "Have you had any of the following symptoms: chest pain, difficulty breathing, palpitations?"     no 6. NEUROLOGIC SYMPTOMS: "Have you had any of the following symptoms: headache, dizziness, vision loss, double vision, changes in speech, unsteady on your feet?"     no 7. OTHER SYMPTOMS: "Do you have any other symptoms?"     no 8. PREGNANCY: "Is there any chance you are pregnant?" "When was your last menstrual period?"     na  Protocols used: NEUROLOGIC DEFICIT-A-AH

## 2020-09-04 ENCOUNTER — Other Ambulatory Visit: Payer: Self-pay

## 2020-09-04 ENCOUNTER — Encounter: Payer: Self-pay | Admitting: Family Medicine

## 2020-09-04 ENCOUNTER — Ambulatory Visit
Admission: RE | Admit: 2020-09-04 | Discharge: 2020-09-04 | Disposition: A | Discharge status: Home / Self Care | Payer: Medicare HMO | Source: Ambulatory Visit | Attending: Family Medicine | Admitting: Family Medicine

## 2020-09-04 ENCOUNTER — Ambulatory Visit
Admission: RE | Admit: 2020-09-04 | Discharge: 2020-09-04 | Disposition: A | Discharge status: Home / Self Care | Payer: Medicare HMO | Attending: Family Medicine | Admitting: Family Medicine

## 2020-09-04 ENCOUNTER — Ambulatory Visit (INDEPENDENT_AMBULATORY_CARE_PROVIDER_SITE_OTHER): Payer: Medicare HMO | Admitting: Family Medicine

## 2020-09-04 VITALS — BP 126/77 | HR 54 | Temp 97.5°F | Ht 72.0 in | Wt 174.6 lb

## 2020-09-04 DIAGNOSIS — M25512 Pain in left shoulder: Secondary | ICD-10-CM

## 2020-09-04 DIAGNOSIS — M7542 Impingement syndrome of left shoulder: Secondary | ICD-10-CM | POA: Diagnosis not present

## 2020-09-04 DIAGNOSIS — M25511 Pain in right shoulder: Secondary | ICD-10-CM | POA: Insufficient documentation

## 2020-09-04 DIAGNOSIS — G8929 Other chronic pain: Secondary | ICD-10-CM | POA: Insufficient documentation

## 2020-09-04 MED ORDER — DICLOFENAC SODIUM 1 % EX GEL: 2.0000 g | Freq: Four times a day (QID) | CUTANEOUS | 2 refills | Status: DC | PRN

## 2020-09-04 MED ORDER — BACLOFEN 10 MG PO TABS: 5.0000 mg | ORAL_TABLET | Freq: Two times a day (BID) | ORAL | 1 refills | Status: DC | PRN

## 2020-09-04 NOTE — Patient Instructions (Addendum)
Thank you for coming to the office today.  Start taking Baclofen (Lioresal) 10mg  (muscle relaxant) - start with one pill at night as needed for next 1-3 nights (may make you drowsy, caution with driving) see how it affects you, then if tolerated increase to one pill 2 to 3 times a day or (every 8 hours as needed)  Recommend to start taking Tylenol Extra Strength 500mg  tabs - take 1 to 2 tabs per dose (max 1000mg ) every 6-8 hours for pain (take regularly, don't skip a dose for next 7 days), max 24 hour daily dose is 6 tablets or 3000mg . In the future you can repeat the same everyday Tylenol course for 1-2 weeks at a time.   If significant flare of pain you can add anti inflammatory Recommend trial of Anti-inflammatory with OTC TOPICAL Voltaren (Diclofenac) 1-4 times a day topical rub as needed  Likely some arthritis and bursitis  X-ray today stay tuned for results.  Future can refer to Ortho or PT  Please schedule a Follow-up Appointment to: Return in about 4 weeks (around 10/02/2020), or if symptoms worsen or fail to improve, for shoulder bursitis.  If you have any other questions or concerns, please feel free to call the office or send a message through [place]. You may also schedule an earlier appointment if necessary.  Additionally, you may be receiving a survey about your experience at our office within a few days to 1 week by e-mail or mail. We value your feedback.  [name], DO [place]

## 2020-09-04 NOTE — Progress Notes (Signed)
Subjective:    [name], male    DOB: 30-Oct-1950, 70 y.o.   MRN: [id number]  Jesse Figueroa is a 70 y.o. male presenting on 09/04/2020 for Shoulder Pain (Both shoulders and it started about 3 weeks ago.) and Numbness (Both feet but mainly on the right foot. It happens mainly while sitting down and once he is active numbness goes away./Right hand but only the middle finger (tip)  and it lasted about 3 minutes. Pt states he did not hurt it, first time happening.)   HPI  Bilateral Shoulder Pain, subacute on chronic Left Shoulder > Right Last episode similar flare of shoulder pain, back in 01/2020 has had problem before as well, treated with baclofen muscle relaxant, Tylenol, NSAID, relative rest, heat/ice. Did not do X-ray or Physical Therapy. Today reports onset about 3 weeks ago, seems to happen with repetitive activities. Can occur first thing in morning when shoulder is stiff and some soreness. He needs new order of Baclofen. He can still function and do what he needs to do regularly now. Not limiting his function  Bilateral Numbness in Feet Left foot > Right He says if sitting on toilet for period of time, he will have a numbness that occurs in Left foot mostly this is described as a paresthesia or pins and needles, often improves when he starts to tap or move his foot and then get up it does improve within seconds. Also can occur when he has leg crossed over the other sitting. Otherwise does not happen at all.  Additionally  R middle finger tip numbness He was standing talking with someone and had brief episode of tip of R middle finger went numb for a few seconds only, no other associated or focal symptoms. No pain or swelling. It has resolved promptly and has not returned.   Depression screen [facility] 2/9 11/25/2019 10/27/2019 08/11/2019  Decreased Interest 0 0 0  Down, Depressed, Hopeless 0 0 0  PHQ - 2 Score 0 0 0    Social History   Tobacco Use  . Smoking status:  Never Smoker  . Smokeless tobacco: Never Used  Vaping Use  . Vaping Use: Never used  Substance Use Topics  . Alcohol use: No  . Drug use: No    Review of Systems Per HPI unless specifically indicated above     Objective:    BP 126/77 (BP Location: Left Arm, Patient Position: Sitting, Cuff Size: Normal)   Pulse (!) 54   Temp (!) 97.5 F (36.4 C) (Temporal)   Ht 6' (1.829 m)   Wt 174 lb 9.6 oz (79.2 kg)   SpO2 100%   BMI 23.68 kg/m   Wt Readings from Last 3 Encounters:  09/04/20 174 lb 9.6 oz (79.2 kg)  03/27/20 168 lb 6.4 oz (76.4 kg)  02/09/20 171 lb 9.6 oz (77.8 kg)    Physical Exam Vitals and nursing note reviewed.  Constitutional:      General: He is not in acute distress.    Appearance: He is well-developed. He is not diaphoretic.     Comments: Well-appearing, comfortable, cooperative  HENT:     Head: Normocephalic and atraumatic.  Eyes:     General:        Right eye: No discharge.        Left eye: No discharge.     Conjunctiva/sclera: Conjunctivae normal.  Cardiovascular:     Rate and Rhythm: Normal rate.  Pulmonary:  Effort: Pulmonary effort is normal.  Musculoskeletal:     Comments: Bilateral Shoulder Inspection: Normal appearance bilateral symmetrical Palpation: Non-tender to palpation over anterior, lateral, or posterior shoulder  ROM: Slight reduced forward flex and slight abduction L shoulder otherwise intact active ROM forward flexion, abduction, internal / external rotation, symmetrical Special Testing: Rotator cuff testing with mild provoked pain with empty can L shoulder. Mild impingement symptoms L >R Strength: Normal strength 5/5 flex/ext, ext rot / int rot, grip, rotator cuff str testing. Neurovascular: Distally intact pulses, sensation to light touch  Skin:    General: Skin is warm and dry.     Findings: No erythema or rash.  Neurological:     Mental Status: He is alert and oriented to person, place, and time.     Sensory: No sensory  deficit (intact to light touch and monofilament hands extremities).  Psychiatric:        Behavior: Behavior normal.     Comments: Well groomed, good eye contact, normal speech and thoughts    Results for orders placed or performed in visit on 11/16/19  TSH  Result Value Ref Range   TSH 3.15 0.40 - 4.50 mIU/L  PSA  Result Value Ref Range   PSA 0.9 < OR = 4.0 ng/mL  Lipid panel  Result Value Ref Range   Cholesterol 135 <200 mg/dL   HDL 49 > OR = 40 mg/dL   Triglycerides 59 <150 mg/dL   LDL Cholesterol (Calc) 72 mg/dL (calc)   Total CHOL/HDL Ratio 2.8 <5.0 (calc)   Non-HDL Cholesterol (Calc) 86 <130 mg/dL (calc)  COMPLETE METABOLIC PANEL WITH GFR  Result Value Ref Range   Glucose, Bld 105 (H) 65 - 99 mg/dL   BUN 17 7 - 25 mg/dL   Creat 1.17 0.70 - 1.18 mg/dL   GFR, Est Non African American 62 > OR = 60 mL/min/1.49m2   GFR, Est African American 72 > OR = 60 mL/min/1.12m2   BUN/Creatinine Ratio NOT APPLICABLE 6 - 22 (calc)   Sodium 141 135 - 146 mmol/L   Potassium 4.5 3.5 - 5.3 mmol/L   Chloride 104 98 - 110 mmol/L   CO2 31 20 - 32 mmol/L   Calcium 9.5 8.6 - 10.3 mg/dL   Total Protein 6.8 6.1 - 8.1 g/dL   Albumin 4.2 3.6 - 5.1 g/dL   Globulin 2.6 1.9 - 3.7 g/dL (calc)   AG Ratio 1.6 1.0 - 2.5 (calc)   Total Bilirubin 0.5 0.2 - 1.2 mg/dL   Alkaline phosphatase (APISO) 99 35 - 144 U/L   AST 22 10 - 35 U/L   ALT 22 9 - 46 U/L  CBC with Differential/Platelet  Result Value Ref Range   WBC 4.1 3.8 - 10.8 Thousand/uL   RBC 5.14 4.20 - 5.80 Million/uL   Hemoglobin 14.2 13.2 - 17.1 g/dL   HCT 44.8 38.5 - 50.0 %   MCV 87.2 80.0 - 100.0 fL   MCH 27.6 27.0 - 33.0 pg   MCHC 31.7 (L) 32.0 - 36.0 g/dL   RDW 13.1 11.0 - 15.0 %   Platelets 292 140 - 400 Thousand/uL   MPV 11.1 7.5 - 12.5 fL   Neutro Abs 1,427 (L) 1,500 - 7,800 cells/uL   Lymphs Abs 1,989 850 - 3,900 cells/uL   Absolute Monocytes 455 200 - 950 cells/uL   Eosinophils Absolute 201 15 - 500 cells/uL   Basophils  Absolute 29 0 - 200 cells/uL   Neutrophils Relative % 34.8 %  Total Lymphocyte 48.5 %   Monocytes Relative 11.1 %   Eosinophils Relative 4.9 %   Basophils Relative 0.7 %  Hemoglobin A1c  Result Value Ref Range   Hgb A1c MFr Bld 6.1 (H) <5.7 % of total Hgb   Mean Plasma Glucose 128 (calc)   eAG (mmol/L) 7.1 (calc)      Assessment & Plan:   Problem List Items Addressed This Visit   None   Visit Diagnoses    Rotator cuff impingement syndrome, left    -  Primary   Relevant Medications   diclofenac Sodium (VOLTAREN) 1 % GEL   Other Relevant Orders   DG Shoulder Left   Chronic pain of both shoulders       Relevant Medications   baclofen (LIORESAL) 10 MG tablet   diclofenac Sodium (VOLTAREN) 1 % GEL   Other Relevant Orders   DG Shoulder Left      Acute on chronic episodic flare L >R Shoulder, chronic bilateral shoulder pain Additionally history of neck  And shoulder muscle spasm Seems to have fairly localized L Shoulder rotator cuff impingement, only one specific part of rotator cuff mostly supraspintaus on exam Has had some repetitive overhead activities to provoke  Limited symptoms at rest and daily activity, usually provoked activities only  No evidence of radicular symptoms or neurological deficits or weakness. - his paresthesia in lower extremity and R middle finger are all very reproducible and distinctly caused by nerve compression. - Inadequate conservative treatments at home  Plan Left Shoulder X-ray today given recurrent shoulder problem. Not as consistent with Cervical Spine etiology, will defer C-Spine X-ray today  - Re order Baclofen 5-10mg  QHS PRN muscle relaxant, caution sedation - Add Tylenol 500-1000mg  TID PRN  - OTC Voltaren coupon given - Consider NSAID PRN if need OTC instruction given - Heating pad / ice, consider muscle rub - ROM stretching  - Follow up if not improving - we can refer to Ortho vs PT  Meds ordered this encounter   Medications  . baclofen (LIORESAL) 10 MG tablet    Sig: Take 0.5-1 tablets (5-10 mg total) by mouth 2 (two) times daily as needed for muscle spasms.    Dispense:  180 tablet    Refill:  1  . diclofenac Sodium (VOLTAREN) 1 % GEL    Sig: Apply 2 g topically 4 (four) times daily as needed (joint pain arthritis shoulder).    Dispense:  100 g    Refill:  2      Follow up plan: Return in about 4 weeks (around 10/02/2020), or if symptoms worsen or fail to improve, for shoulder bursitis.   [name], [facility] 09/04/2020, 9:38 AM

## 2020-09-10 ENCOUNTER — Ambulatory Visit (INDEPENDENT_AMBULATORY_CARE_PROVIDER_SITE_OTHER): Payer: Medicare HMO | Admitting: General Practice

## 2020-09-10 ENCOUNTER — Telehealth: Payer: Self-pay | Admitting: General Practice

## 2020-09-10 DIAGNOSIS — M25511 Pain in right shoulder: Secondary | ICD-10-CM

## 2020-09-10 DIAGNOSIS — I129 Hypertensive chronic kidney disease with stage 1 through stage 4 chronic kidney disease, or unspecified chronic kidney disease: Secondary | ICD-10-CM

## 2020-09-10 DIAGNOSIS — M7542 Impingement syndrome of left shoulder: Secondary | ICD-10-CM

## 2020-09-10 DIAGNOSIS — E7849 Other hyperlipidemia: Secondary | ICD-10-CM

## 2020-09-10 DIAGNOSIS — N182 Chronic kidney disease, stage 2 (mild): Secondary | ICD-10-CM | POA: Diagnosis not present

## 2020-09-10 DIAGNOSIS — G8929 Other chronic pain: Secondary | ICD-10-CM

## 2020-09-10 NOTE — Patient Instructions (Signed)
Visit Information  PATIENT GOALS: Goals Addressed            This Visit's Progress   . COMPLETED: RNCM: I do want to know how to better manage my hypertension       Current Barriers: Closing this goal and opening in new ELs . Chronic Disease Management support, education, and care coordination needs related to HTN, HLD, CKD Stage 2, and stomach issues . COVID Vaccination concerns/questions- the patient has received both vaccines for COVID and feels safer- Had booster on 04-04-2020.  Clinical Goal(s) related to: HTN, HLD, CKD Stage 2, and stomach issues COVID Vaccination concerns/Questions Over the next 120 days, patient will:  . Work with the care management team to address educational, disease management, and care coordination needs  . Begin or continue self health monitoring activities as directed today Measure and record blood pressure 5 to 6 times per week and record  and monitor for factors that trigger issues related to stomach issues and concerns . Call provider office for new or worsened signs and symptoms Blood pressure findings outside established parameters, New or worsened symptom related to stomach issues, and changes in chronic disease process . Call care management team with questions or concerns . Verbalize basic understanding of patient centered plan of care established today . Receive desired COVID19 vaccination   Interventions related to HTN, HLD, CKD Stage 2, and stomach issues . Evaluation of current treatment plans and patient's adherence to plan as established by provider.  The patient verbalized he is managing his health well at this time and has not new concerns. 06-04-2020: The patient states he is doing well and denies any new concerns. States he had a good appointment with pcp.  The patient states right now his shoulder and elbow pain has resolved.  He says he is doing well.  . Assessed patient understanding of disease states. Has good understanding of his  conditions and how to effectively manage.  . Assessed patient's education and care coordination needs, the patient declines assistance of the pharmacist and social worker at this time; however knows they are available to assist with any new concerns/help.  04-09-2020: The patient agrees to talk to the pharmacist about safe laxatives to use with his medication regimen. The patient states that he does have some constipation at times and his stomach is not really upset but "hurts" off and on.  Will collaborate with the pharmacist for medications reconciliation and recommendations for constipation. 06-04-2020: The patient denies any educational needs at this time. The patient states that he knows when to call for changes.  . Provided disease specific education to patient to assist with managing his HTN, and other chronic medical conditions. Review of a heart healthy diet and regular exercise to maintain health and well being.  02-06-2020: The patient states that he is trying to watch his dietary intake and eat healthy. Does pretty good most of the time. Praised for hemoglobin A1C of 6.1 and remaining in good control of monitoring for pre-diabetes. 04-09-2020: Is complaining of stomach pain off and on. Feels it may be because of constipation. Discussed prune juice, hydration, eating fruits and vegetables. Discussed mild OTC options as well.  Will have pharmacist reach out to review medications list and give recommendations for help with constipation. 06-04-2020: The patient denies any issues with stomach pain at this time. Feels he is doing well managing his health and well being.  . Reviewed heart healthy diet. The patient states he is watching what  he eats and his blood pressures are ranging around 703 to 500'X systolic and around 90 diastolic. Education on heart rate. In the office back in June it was low but now the patient verbalized it is in the 60's to 70's. The patient keeps a record of it.  The patient denies  any new concerns at this time.  . Assessed the patients stomach issues. 04-09-2020: This is a chronic ongoing condition.  Has pain off and on. Gave ideas and recommendations. Will discuss with CCM team pharmacist. 06-04-2020: States stomach issues have resolved currently.  . Evaluation of new concerns. The patient verbalized he needs to come in and see Dr. Parks Ranger. He has a new complaint of shoulder pain and right elbow pain.  The patient is wearing a "sleeve" on his elbow and this helps sometimes but sometimes he has sharp pains in his elbow, especially when he is lifting the tailgate of his truck or using his right arm. The patient denies it waking him from sleep. Does want to get it checked out to see what the recommendations of the pcp are. 06-04-2020: Denies any new concerns with the shoulder and elbow pain. States they are stable at this time.  Nash Dimmer with appropriate clinical care team members regarding patient needs.  No new needs at this time.  . Assessed upcoming appointments with pcp: Last appointment with the pcp was 05-08-2020.  The patient knows to call the office for changes or needs.   Patient Self Care Activities related to HTN, HLD, CKD Stage 2, and stomach issues . Patient is unable to independently self-manage chronic health conditions  Please see past updates related to this goal by clicking on the "Past Updates" button in the selected goal      . RNCM: Manage Chronic Pain       Timeframe:  Short-Term Goal Priority:  High Start Date:   09-10-2020                          Expected End Date:      12-13-2020                 Follow Up Date 10-29-2020   - call for medicine refill 2 or 3 days before it runs out - develop a personal pain management plan - keep track of prescription refills - plan exercise or activity when pain is best controlled - prioritize tasks for the day - track times pain is worst and when it is best - track what makes the pain worse and what  makes it better - use ice or heat for pain relief - work slower and less intense when having pain    Why is this important?    Day-to-day life can be hard when you have chronic pain.   Pain medicine is just one piece of the treatment puzzle.   You can try these action steps to help you manage your pain.    Notes: Pain bilateral shoulders with left rotator cuff pain      Patient Care Plan: RNCM: Hypertension (Adult)    Problem Identified: RNCM: Hypertension (Hypertension)   Priority: Medium    Long-Range Goal: RNCM: Hypertension Monitored   Priority: Medium  Note:   Objective:  . Last practice recorded BP readings:  BP Readings from Last 3 Encounters:  09/04/20 126/77  03/27/20 132/83  02/09/20 122/70 .   Marland Kitchen Most recent eGFR/CrCl: No results found for: EGFR  No  components found for: CRCL Current Barriers:  Marland Kitchen Knowledge Deficits related to basic understanding of hypertension pathophysiology and self care management . Knowledge Deficits related to understanding of medications prescribed for management of hypertension . Does not contact provider office for questions/concerns Case Manager Clinical Goal(s):  . patient will verbalize understanding of plan for hypertension management . patient will demonstrate improved adherence to prescribed treatment plan for hypertension as evidenced by taking all medications as prescribed, monitoring and recording blood pressure as directed, adhering to low sodium/DASH diet . patient will demonstrate improved health management independence as evidenced by checking blood pressure as directed and notifying PCP if SBP>160 or DBP > 90, taking all medications as prescribe, and adhering to a low sodium diet as discussed. . patient will verbalize basic understanding of hypertension disease process and self health management plan as evidenced by compliance with medications, compliance with heart healthy diet, and working with the CCM team to manage health  and well being.  Interventions:  . Collaboration with Olin Hauser, DO regarding development and update of comprehensive plan of care as evidenced by provider attestation and co-signature . Inter-disciplinary care team collaboration (see longitudinal plan of care) . Evaluation of current treatment plan related to hypertension self management and patient's adherence to plan as established by provider. . Provided education to patient re: stroke prevention, s/s of heart attack and stroke, DASH diet, complications of uncontrolled blood pressure . Reviewed medications with patient and discussed importance of compliance . Discussed plans with patient for ongoing care management follow up and provided patient with direct contact information for care management team . Advised patient, providing education and rationale, to monitor blood pressure daily and record, calling PCP for findings outside established parameters.  Self-Care Activities: - Self administers medications as prescribed Attends all scheduled provider appointments Calls provider office for new concerns, questions, or BP outside discussed parameters Checks BP and records as discussed Follows a low sodium diet/DASH diet Patient Goals: - check blood pressure weekly - choose a place to take my blood pressure (home, clinic or office, retail store) - write blood pressure results in a log or diary - agree on reward when goals are met - agree to work together to make changes - ask questions to understand - learn about high blood pressure - blood pressure equipment and technique reviewed - blood pressure trends reviewed - depression screen reviewed - home or ambulatory blood pressure monitoring encouraged  Follow Up Plan: Telephone follow up appointment with care management team member scheduled for: 10-29-2020 at 0900 am   Task: RNCM: Identify and Monitor Blood Pressure Elevation   Note:   Care Management Activities:    -  blood pressure equipment and technique reviewed - blood pressure trends reviewed - depression screen reviewed - home or ambulatory blood pressure monitoring encouraged       Patient Care Plan: RNCM: Chronic Pain (Adult)    Problem Identified: RNCM: Pain Management Plan (Chronic Pain)   Priority: High    Goal: RNCM: Pain Management Plan Developed   Priority: High  Note:   Current Barriers:  Marland Kitchen Knowledge Deficits related to managing acute/chronic pain . Non-adherence to scheduled provider appointments . Non-adherence to prescribed medication regimen . Difficulty obtaining medications . Chronic Disease Management support and education needs related to chronic pain . Unable to independently manage chronic pain and discomfort effectively  . Does not contact provider office for questions/concerns Nurse Case Manager Clinical Goal(s):  . patient will verbalize understanding of plan for  managing pain . patient will attend all scheduled medical appointments: patient to schedule appointment for 2 weeks . patient will demonstrate use of different relaxation  skills and/or diversional activities to assist with pain reduction (distraction, imagery, relaxation, massage, acupressure, TENS, heat, and cold application . patient will report pain at a level less than 3 to 4 on a 10-10 rating scale . patient will use pharmacological and nonpharmacological pain relief strategies . patient will verbalize acceptable level of pain relief and ability to engage in desired activities . patient will engage in desired activities without an increase in pain level Interventions:  . Collaboration with Olin Hauser, DO regarding development and update of comprehensive plan of care as evidenced by provider attestation and co-signature . Inter-disciplinary care team collaboration (see longitudinal plan of care) . - careful application of heat or ice encouraged . - deep breathing, relaxation and mindfulness  use promoted . - effectiveness of pharmacologic therapy monitored . - medication-induced side effects managed . - misuse of pain medication assessed . - motivation and barriers to change assessed and addressed . - mutually acceptable comfort goal set . - pain assessed . - pain treatment goals reviewed . - premedication prior to activity encouraged . Evaluation of current treatment plan related to pain management to pain in bilateral shoulders with left rotator cuff involvement  and patient's adherence to plan as established by provider. . Advised patient to call the office for changes in level or intensity of pain  . Provided education to patient re: alternative pain relieve measures, reporting changes in pain, working with the pcp and CCM team to effectively manage pain and discomfort.  . Reviewed medications with patient and discussed compliance.  The patient states he can not see any difference in his pain level with medications use. The stimulation device is helpful. The patient will follow up with the pcp in 2 weeks for reevaluation.  . Discussed plans with patient for ongoing care management follow up and provided patient with direct contact information for care management team . Allow patient to maintain a diary of pain ratings, timing, precipitating events, medications, treatments, and what works best to relieve pain,  . Refer to support groups and self-help groups . Educate patient about the use of pharmacological interventions for pain management- antianxiety, antidepressants, NSAIDS, opioid analgesics,  . Explain the importance of lifestyle modifications to effective pain management  Patient Goals/Self Care Activities:  . - mutually acceptable comfort goal set . - pain assessed . - pain management plan developed . - pain treatment goals reviewed . - patient response to treatment assessed . - sharing of pain management plan with teachers and other caregivers  encouraged . Self-administers medications as prescribed . Attends all scheduled provider appointments . Calls pharmacy for medication refills . Calls provider office for new concerns or questions Follow Up Plan: Telephone follow up appointment with care management team member scheduled for: 10-29-2020 at 0900     Task: Partner to Develop Chronic Pain Management Plan   Note:   Care Management Activities:    - mutually acceptable comfort goal set - pain assessed - pain management plan developed - pain treatment goals reviewed - patient response to treatment assessed - sharing of pain management plan with teachers and other caregivers encouraged       Patient Care Plan: RNCM: HLD Management    Problem Identified: Health Promotion or Disease Self-Management (General Plan of Care)     Long-Range Goal: RNCM: Management of HLD  Priority: Medium  Note:   Current Barriers:  . Poorly controlled hyperlipidemia, complicated by chronic pain . Current antihyperlipidemic regimen: Lipitor 10 mg . Most recent lipid panel:     Component Value Date/Time   CHOL 135 11/16/2019 0817   CHOL 121 10/15/2015 0914   TRIG 59 11/16/2019 0817   HDL 49 11/16/2019 0817   HDL 47 10/15/2015 0914   CHOLHDL 2.8 11/16/2019 0817   VLDL 24 08/28/2016 0001   LDLCALC 72 11/16/2019 0817 .   Marland Kitchen ASCVD risk enhancing conditions: age >37, HTN, CKD . Unable to independently manage HLD . Does not contact provider office for questions/concerns RN Care Manager Clinical Goal(s):  . patient will work with Consulting civil engineer, providers, and care team towards execution of optimized self-health management plan . patient will verbalize understanding of plan for effective management of HLD . patient will work with RNCM, pcp, and CCM team to address needs related to effective management of HLD Interventions: . Collaboration with Olin Hauser, DO regarding development and update of comprehensive plan of care as  evidenced by provider attestation and co-signature . Inter-disciplinary care team collaboration (see longitudinal plan of care) . Medication review performed; medication list updated in electronic medical record.  Bertram Savin care team collaboration (see longitudinal plan of care) . Referred to pharmacy team for assistance with HLD medication management . Evaluation of current treatment plan related to HLD and patient's adherence to plan as established by provider. . Advised patient to call the office for changes or questions . Provided education to patient re: heart healthy diet, activity and effective management of HLD . Discussed plans with patient for ongoing care management follow up and provided patient with direct contact information for care management team Patient Goals/Self-Care Activities: - call for medicine refill 2 or 3 days before it runs out - call if I am sick and can't take my medicine - keep a list of all the medicines I take; vitamins and herbals too - learn to read medicine labels - use a pillbox to sort medicine - use an alarm clock or phone to remind me to take my medicine - drink 6 to 8 glasses of water each day - eat 3 to 5 servings of fruits and vegetables each day - eat 5 or 6 small meals each day - fill half the plate with nonstarchy vegetables - limit fast food meals to no more than 1 per week - manage portion size - prepare main meal at home 3 to 5 days each week - read food labels for fat, fiber, carbohydrates and portion size - be open to making changes - I can manage, know and watch for signs of a heart attack - if I have chest pain, call for help - learn about small changes that will make a big difference - learn my personal risk factors  - advance care planning facilitated - barriers to meeting goals identified - change-talk evoked - choices provided - collaboration with team encouraged - decision-making supported - difficulty of making  life-long changes acknowledged - health risks reviewed - problem-solving facilitated - questions answered - readiness for change evaluated - reassurance provided - self-reflection promoted - self-reliance encouraged  Follow Up Plan: Telephone follow up appointment with care management team member scheduled for: 10-29-2020 at 0900 am     Task: RNCM: Mutually Develop and Royce Macadamia Achievement of Patient Goals   Note:   Care Management Activities:    - advance care planning facilitated - barriers  to meeting goals identified - change-talk evoked - choices provided - collaboration with team encouraged - decision-making supported - difficulty of making life-long changes acknowledged - health risks reviewed - problem-solving facilitated - questions answered - readiness for change evaluated - reassurance provided - self-reflection promoted - self-reliance encouraged         Patient verbalizes understanding of instructions provided today and agrees to view in [place].   Telephone follow up appointment with care management team member scheduled for: 10-29-2020 at 0900  [place], MSN, [place] Mobile: [phone]

## 2020-09-10 NOTE — Chronic Care Management (AMB) (Signed)
Chronic Care Management   CCM RN Visit Note  09/10/2020 Name: Jesse Figueroa MRN: [id number] DOB: 1950/10/30  Subjective: Jesse Figueroa is a 70 y.o. year old male who is a primary care patient of Olin Hauser, DO. The care management team was consulted for assistance with disease management and care coordination needs.    Engaged with patient by telephone for follow up visit in response to provider referral for case management and/or care coordination services.   Consent to Services:  The patient was given information about Chronic Care Management services, agreed to services, and gave verbal consent prior to initiation of services.  Please see initial visit note for detailed documentation.   Patient agreed to services and verbal consent obtained.   Assessment: Review of patient past medical history, allergies, medications, health status, including review of consultants reports, laboratory and other test data, was performed as part of comprehensive evaluation and provision of chronic care management services.   SDOH (Social Determinants of Health) assessments and interventions performed:    CCM Care Plan  No Known Allergies  Outpatient Encounter Medications as of 09/10/2020  Medication Sig Note  . aspirin 81 MG chewable tablet Chew by mouth.   Marland Kitchen atorvastatin (LIPITOR) 10 MG tablet TAKE 1 TABLET DAILY AT 6 PM.   . baclofen (LIORESAL) 10 MG tablet Take 0.5-1 tablets (5-10 mg total) by mouth 2 (two) times daily as needed for muscle spasms.   . calcium carbonate (TUMS - DOSED IN MG ELEMENTAL CALCIUM) 500 MG chewable tablet Chew 1 tablet by mouth daily as needed for indigestion or heartburn. 04/20/2020: Reports using ~ once/week  . diclofenac Sodium (VOLTAREN) 1 % GEL Apply 2 g topically 4 (four) times daily as needed (joint pain arthritis shoulder).   . fluticasone (FLONASE) 50 MCG/ACT nasal spray USE 2 SPRAYS IN BOTH NOSTRILS DAILY. USE FOR 4 TO 6 WEEKS THEN STOP AND USE  SEASONALLY OR AS NEEDED.   Marland Kitchen losartan (COZAAR) 100 MG tablet TAKE 1 TABLET EVERY DAY   . propranolol (INDERAL) 20 MG tablet TAKE 1 TABLET TWICE DAILY    No facility-administered encounter medications on file as of 09/10/2020.    Patient Active Problem List   Diagnosis Date Noted  . Rash and nonspecific skin eruption 03/27/2020  . Influenza vaccine needed 03/27/2020  . Cyst of right kidney 04/18/2019  . CKD (chronic kidney disease), stage II 09/21/2017  . Arthritis of hand 05/01/2016  . Bradycardia 12/04/2015  . Pre-diabetes 10/15/2015  . Hyperlipidemia 07/09/2015  . Benign hypertension with CKD (chronic kidney disease), stage II 04/09/2015  . Insomnia 10/03/2014  . Essential tremor 10/03/2014    Conditions to be addressed/monitored:HTN, HLD and Chronic Pain  Care Plan : RNCM: Hypertension (Adult)  Updates made by Vanita Ingles since 09/10/2020 12:00 AM    Problem: RNCM: Hypertension (Hypertension)   Priority: Medium    Long-Range Goal: RNCM: Hypertension Monitored   Priority: Medium  Note:   Objective:  . Last practice recorded BP readings:  BP Readings from Last 3 Encounters:  09/04/20 126/77  03/27/20 132/83  02/09/20 122/70 .   Marland Kitchen Most recent eGFR/CrCl: No results found for: EGFR  No components found for: CRCL Current Barriers:  Marland Kitchen Knowledge Deficits related to basic understanding of hypertension pathophysiology and self care management . Knowledge Deficits related to understanding of medications prescribed for management of hypertension . Does not contact provider office for questions/concerns Case Manager Clinical Goal(s):  . patient will verbalize understanding of plan  for hypertension management . patient will demonstrate improved adherence to prescribed treatment plan for hypertension as evidenced by taking all medications as prescribed, monitoring and recording blood pressure as directed, adhering to low sodium/DASH diet . patient will demonstrate improved health  management independence as evidenced by checking blood pressure as directed and notifying PCP if SBP>160 or DBP > 90, taking all medications as prescribe, and adhering to a low sodium diet as discussed. . patient will verbalize basic understanding of hypertension disease process and self health management plan as evidenced by compliance with medications, compliance with heart healthy diet, and working with the CCM team to manage health and well being.  Interventions:  . Collaboration with Olin Hauser, DO regarding development and update of comprehensive plan of care as evidenced by provider attestation and co-signature . Inter-disciplinary care team collaboration (see longitudinal plan of care) . Evaluation of current treatment plan related to hypertension self management and patient's adherence to plan as established by provider. . Provided education to patient re: stroke prevention, s/s of heart attack and stroke, DASH diet, complications of uncontrolled blood pressure . Reviewed medications with patient and discussed importance of compliance . Discussed plans with patient for ongoing care management follow up and provided patient with direct contact information for care management team . Advised patient, providing education and rationale, to monitor blood pressure daily and record, calling PCP for findings outside established parameters.  Self-Care Activities: - Self administers medications as prescribed Attends all scheduled provider appointments Calls provider office for new concerns, questions, or BP outside discussed parameters Checks BP and records as discussed Follows a low sodium diet/DASH diet Patient Goals: - check blood pressure weekly - choose a place to take my blood pressure (home, clinic or office, retail store) - write blood pressure results in a log or diary - agree on reward when goals are met - agree to work together to make changes - ask questions to  understand - learn about high blood pressure - blood pressure equipment and technique reviewed - blood pressure trends reviewed - depression screen reviewed - home or ambulatory blood pressure monitoring encouraged  Follow Up Plan: Telephone follow up appointment with care management team member scheduled for: 10-29-2020 at 0900 am   Task: RNCM: Identify and Monitor Blood Pressure Elevation   Note:   Care Management Activities:    - blood pressure equipment and technique reviewed - blood pressure trends reviewed - depression screen reviewed - home or ambulatory blood pressure monitoring encouraged       Care Plan : RNCM: Chronic Pain (Adult)  Updates made by Vanita Ingles since 09/10/2020 12:00 AM    Problem: RNCM: Pain Management Plan (Chronic Pain)   Priority: High    Goal: RNCM: Pain Management Plan Developed   Priority: High  Note:   Current Barriers:  Marland Kitchen Knowledge Deficits related to managing acute/chronic pain . Non-adherence to scheduled provider appointments . Non-adherence to prescribed medication regimen . Difficulty obtaining medications . Chronic Disease Management support and education needs related to chronic pain . Unable to independently manage chronic pain and discomfort effectively  . Does not contact provider office for questions/concerns Nurse Case Manager Clinical Goal(s):  . patient will verbalize understanding of plan for managing pain . patient will attend all scheduled medical appointments: patient to schedule appointment for 2 weeks . patient will demonstrate use of different relaxation  skills and/or diversional activities to assist with pain reduction (distraction, imagery, relaxation, massage, acupressure, TENS, heat, and cold  application . patient will report pain at a level less than 3 to 4 on a 10-10 rating scale . patient will use pharmacological and nonpharmacological pain relief strategies . patient will verbalize acceptable level of pain  relief and ability to engage in desired activities . patient will engage in desired activities without an increase in pain level Interventions:  . Collaboration with Olin Hauser, DO regarding development and update of comprehensive plan of care as evidenced by provider attestation and co-signature . Inter-disciplinary care team collaboration (see longitudinal plan of care) . - careful application of heat or ice encouraged . - deep breathing, relaxation and mindfulness use promoted . - effectiveness of pharmacologic therapy monitored . - medication-induced side effects managed . - misuse of pain medication assessed . - motivation and barriers to change assessed and addressed . - mutually acceptable comfort goal set . - pain assessed . - pain treatment goals reviewed . - premedication prior to activity encouraged . Evaluation of current treatment plan related to pain management to pain in bilateral shoulders with left rotator cuff involvement  and patient's adherence to plan as established by provider. . Advised patient to call the office for changes in level or intensity of pain  . Provided education to patient re: alternative pain relieve measures, reporting changes in pain, working with the pcp and CCM team to effectively manage pain and discomfort.  . Reviewed medications with patient and discussed compliance.  The patient states he can not see any difference in his pain level with medications use. The stimulation device is helpful. The patient will follow up with the pcp in 2 weeks for reevaluation.  . Discussed plans with patient for ongoing care management follow up and provided patient with direct contact information for care management team . Allow patient to maintain a diary of pain ratings, timing, precipitating events, medications, treatments, and what works best to relieve pain,  . Refer to support groups and self-help groups . Educate patient about the use of  pharmacological interventions for pain management- antianxiety, antidepressants, NSAIDS, opioid analgesics,  . Explain the importance of lifestyle modifications to effective pain management  Patient Goals/Self Care Activities:  . - mutually acceptable comfort goal set . - pain assessed . - pain management plan developed . - pain treatment goals reviewed . - patient response to treatment assessed . - sharing of pain management plan with teachers and other caregivers encouraged . Self-administers medications as prescribed . Attends all scheduled provider appointments . Calls pharmacy for medication refills . Calls provider office for new concerns or questions Follow Up Plan: Telephone follow up appointment with care management team member scheduled for: 10-29-2020 at 0900     Task: Partner to Develop Chronic Pain Management Plan   Note:   Care Management Activities:    - mutually acceptable comfort goal set - pain assessed - pain management plan developed - pain treatment goals reviewed - patient response to treatment assessed - sharing of pain management plan with teachers and other caregivers encouraged       Care Plan : RNCM: HLD Management  Updates made by Vanita Ingles since 09/10/2020 12:00 AM    Problem: Health Promotion or Disease Self-Management (General Plan of Care)     Long-Range Goal: RNCM: Management of HLD   Priority: Medium  Note:   Current Barriers:  . Poorly controlled hyperlipidemia, complicated by chronic pain . Current antihyperlipidemic regimen: Lipitor 10 mg . Most recent lipid panel:  Component Value Date/Time   CHOL 135 11/16/2019 0817   CHOL 121 10/15/2015 0914   TRIG 59 11/16/2019 0817   HDL 49 11/16/2019 0817   HDL 47 10/15/2015 0914   CHOLHDL 2.8 11/16/2019 0817   VLDL 24 08/28/2016 0001   LDLCALC 72 11/16/2019 0817 .   Marland Kitchen ASCVD risk enhancing conditions: age >71, HTN, CKD . Unable to independently manage HLD . Does not contact  provider office for questions/concerns RN Care Manager Clinical Goal(s):  . patient will work with Consulting civil engineer, providers, and care team towards execution of optimized self-health management plan . patient will verbalize understanding of plan for effective management of HLD . patient will work with RNCM, pcp, and CCM team to address needs related to effective management of HLD Interventions: . Collaboration with Olin Hauser, DO regarding development and update of comprehensive plan of care as evidenced by provider attestation and co-signature . Inter-disciplinary care team collaboration (see longitudinal plan of care) . Medication review performed; medication list updated in electronic medical record.  Bertram Savin care team collaboration (see longitudinal plan of care) . Referred to pharmacy team for assistance with HLD medication management . Evaluation of current treatment plan related to HLD and patient's adherence to plan as established by provider. . Advised patient to call the office for changes or questions . Provided education to patient re: heart healthy diet, activity and effective management of HLD . Discussed plans with patient for ongoing care management follow up and provided patient with direct contact information for care management team Patient Goals/Self-Care Activities: - call for medicine refill 2 or 3 days before it runs out - call if I am sick and can't take my medicine - keep a list of all the medicines I take; vitamins and herbals too - learn to read medicine labels - use a pillbox to sort medicine - use an alarm clock or phone to remind me to take my medicine - drink 6 to 8 glasses of water each day - eat 3 to 5 servings of fruits and vegetables each day - eat 5 or 6 small meals each day - fill half the plate with nonstarchy vegetables - limit fast food meals to no more than 1 per week - manage portion size - prepare main meal at home 3  to 5 days each week - read food labels for fat, fiber, carbohydrates and portion size - be open to making changes - I can manage, know and watch for signs of a heart attack - if I have chest pain, call for help - learn about small changes that will make a big difference - learn my personal risk factors  - advance care planning facilitated - barriers to meeting goals identified - change-talk evoked - choices provided - collaboration with team encouraged - decision-making supported - difficulty of making life-long changes acknowledged - health risks reviewed - problem-solving facilitated - questions answered - readiness for change evaluated - reassurance provided - self-reflection promoted - self-reliance encouraged  Follow Up Plan: Telephone follow up appointment with care management team member scheduled for: 10-29-2020 at 0900 am     Task: RNCM: Mutually Develop and Royce Macadamia Achievement of Patient Goals   Note:   Care Management Activities:    - advance care planning facilitated - barriers to meeting goals identified - change-talk evoked - choices provided - collaboration with team encouraged - decision-making supported - difficulty of making life-long changes acknowledged - health risks reviewed - problem-solving facilitated -  questions answered - readiness for change evaluated - reassurance provided - self-reflection promoted - self-reliance encouraged         Plan:Telephone follow up appointment with care management team member scheduled for:  10-29-2020 at 0900 am  [place] Grant, MSN, [place] Mobile: [phone]

## 2020-09-14 ENCOUNTER — Telehealth: Payer: Self-pay | Admitting: Family Medicine

## 2020-09-14 NOTE — Telephone Encounter (Signed)
Copied from [place] [id number]. Topic: Medicare AWV >> Sep 14, 2020  1:41 PM Cher Nakai R wrote: Reason for CRM:   Left message for patient to call back and schedule the Medicare Annual Wellness Visit (AWV) virtually or by telephone.  Last AWV 02/08/2019  Please schedule at anytime with [facility].  40 minute appointment  Any questions, please call me at [phone]

## 2020-09-21 ENCOUNTER — Other Ambulatory Visit: Payer: Self-pay

## 2020-09-21 ENCOUNTER — Telehealth: Payer: Self-pay | Admitting: Family Medicine

## 2020-09-21 DIAGNOSIS — R251 Tremor, unspecified: Secondary | ICD-10-CM

## 2020-09-21 MED ORDER — PROPRANOLOL HCL 20 MG PO TABS: 20.0000 mg | ORAL_TABLET | Freq: Two times a day (BID) | ORAL | 3 refills | Status: DC

## 2020-09-21 NOTE — Telephone Encounter (Signed)
Pt is calling back to schedule AWV. Please advise CB- [phone]

## 2020-09-21 NOTE — Telephone Encounter (Signed)
Medication: propranolol (INDERAL) 20 MG tablet [[id number]]   Has the patient contacted their pharmacy? YES (Agent: If no, request that the patient contact the pharmacy for the refill.) (Agent: If yes, when and what did the pharmacy advise?)  Preferred Pharmacy (with phone number or street name): [id number] - [place], [address] [place] [address] Phone: [phone] Fax: [phone] Hours: Not open 24 hours    Agent: Please be advised that RX refills may take up to 3 business days. We ask that you follow-up with your pharmacy.

## 2020-09-21 NOTE — Telephone Encounter (Signed)
Refill has been sent to [facility]

## 2020-10-02 ENCOUNTER — Ambulatory Visit (INDEPENDENT_AMBULATORY_CARE_PROVIDER_SITE_OTHER): Payer: Medicare HMO

## 2020-10-02 VITALS — Ht 72.0 in | Wt 168.0 lb

## 2020-10-02 DIAGNOSIS — Z0000 Encounter for general adult medical examination without abnormal findings: Secondary | ICD-10-CM | POA: Diagnosis not present

## 2020-10-02 NOTE — Patient Instructions (Signed)
Jesse Figueroa , Thank you for taking time to come for your Medicare Wellness Visit. I appreciate your ongoing commitment to your health goals. Please review the following plan we discussed and let me know if I can assist you in the future.   Screening recommendations/referrals: Colonoscopy: completed 02/05/2016, due 02/04/2021 Recommended yearly ophthalmology/optometry visit for glaucoma screening and checkup Recommended yearly dental visit for hygiene and checkup  Vaccinations: Influenza vaccine: completed 03/27/2020, due 01/14/2021 Pneumococcal vaccine: completed 05/01/2016 Tdap vaccine: due Shingles vaccine: discussed   Covid-19:  04/04/2020, 08/08/2019, 07/18/2019  Advanced directives: Please bring a copy of your POA (Power of Attorney) and/or Living Will to your next appointment.   Conditions/risks identified: none  Next appointment: Follow up in one year for your annual wellness visit.   Preventive Care 33 Years and Older, Male Preventive care refers to lifestyle choices and visits with your health care provider that can promote health and wellness. What does preventive care include?  A yearly physical exam. This is also called an annual well check.  Dental exams once or twice a year.  Routine eye exams. Ask your health care provider how often you should have your eyes checked.  Personal lifestyle choices, including:  Daily care of your teeth and gums.  Regular physical activity.  Eating a healthy diet.  Avoiding tobacco and drug use.  Limiting alcohol use.  Practicing safe sex.  Taking low doses of aspirin every day.  Taking vitamin and mineral supplements as recommended by your health care provider. What happens during an annual well check? The services and screenings done by your health care provider during your annual well check will depend on your age, overall health, lifestyle risk factors, and family history of disease. Counseling  Your health care provider may  ask you questions about your:  Alcohol use.  Tobacco use.  Drug use.  Emotional well-being.  Home and relationship well-being.  Sexual activity.  Eating habits.  History of falls.  Memory and ability to understand (cognition).  Work and work Statistician. Screening  You may have the following tests or measurements:  Height, weight, and BMI.  Blood pressure.  Lipid and cholesterol levels. These may be checked every 5 years, or more frequently if you are over 27 years old.  Skin check.  Lung cancer screening. You may have this screening every year starting at age 57 if you have a 30-pack-year history of smoking and currently smoke or have quit within the past 15 years.  Fecal occult blood test (FOBT) of the stool. You may have this test every year starting at age 83.  Flexible sigmoidoscopy or colonoscopy. You may have a sigmoidoscopy every 5 years or a colonoscopy every 10 years starting at age 36.  Prostate cancer screening. Recommendations will vary depending on your family history and other risks.  Hepatitis C blood test.  Hepatitis B blood test.  Sexually transmitted disease (STD) testing.  Diabetes screening. This is done by checking your blood sugar (glucose) after you have not eaten for a while (fasting). You may have this done every 1-3 years.  Abdominal aortic aneurysm (AAA) screening. You may need this if you are a current or former smoker.  Osteoporosis. You may be screened starting at age 85 if you are at high risk. Talk with your health care provider about your test results, treatment options, and if necessary, the need for more tests. Vaccines  Your health care provider may recommend certain vaccines, such as:  Influenza vaccine. This is  recommended every year.  Tetanus, diphtheria, and acellular pertussis (Tdap, Td) vaccine. You may need a Td booster every 10 years.  Zoster vaccine. You may need this after age 55.  Pneumococcal 13-valent  conjugate (PCV13) vaccine. One dose is recommended after age 58.  Pneumococcal polysaccharide (PPSV23) vaccine. One dose is recommended after age 59. Talk to your health care provider about which screenings and vaccines you need and how often you need them. This information is not intended to replace advice given to you by your health care provider. Make sure you discuss any questions you have with your health care provider. Document Released: 06/29/2015 Document Revised: 02/20/2016 Document Reviewed: 04/03/2015 Elsevier Interactive Patient Education  2017 [place] Prevention in the Home Falls can cause injuries. They can happen to people of all ages. There are many things you can do to make your home safe and to help prevent falls. What can I do on the outside of my home?  Regularly fix the edges of walkways and driveways and fix any cracks.  Remove anything that might make you trip as you walk through a door, such as a raised step or threshold.  Trim any bushes or trees on the path to your home.  Use bright outdoor lighting.  Clear any walking paths of anything that might make someone trip, such as rocks or tools.  Regularly check to see if handrails are loose or broken. Make sure that both sides of any steps have handrails.  Any raised decks and porches should have guardrails on the edges.  Have any leaves, snow, or ice cleared regularly.  Use sand or salt on walking paths during winter.  Clean up any spills in your garage right away. This includes oil or grease spills. What can I do in the bathroom?  Use night lights.  Install grab bars by the toilet and in the tub and shower. Do not use towel bars as grab bars.  Use non-skid mats or decals in the tub or shower.  If you need to sit down in the shower, use a plastic, non-slip stool.  Keep the floor dry. Clean up any water that spills on the floor as soon as it happens.  Remove soap buildup in the tub or  shower regularly.  Attach bath mats securely with double-sided non-slip rug tape.  Do not have throw rugs and other things on the floor that can make you trip. What can I do in the bedroom?  Use night lights.  Make sure that you have a light by your bed that is easy to reach.  Do not use any sheets or blankets that are too big for your bed. They should not hang down onto the floor.  Have a firm chair that has side arms. You can use this for support while you get dressed.  Do not have throw rugs and other things on the floor that can make you trip. What can I do in the kitchen?  Clean up any spills right away.  Avoid walking on wet floors.  Keep items that you use a lot in easy-to-reach places.  If you need to reach something above you, use a strong step stool that has a grab bar.  Keep electrical cords out of the way.  Do not use floor polish or wax that makes floors slippery. If you must use wax, use non-skid floor wax.  Do not have throw rugs and other things on the floor that can make you trip.  What can I do with my stairs?  Do not leave any items on the stairs.  Make sure that there are handrails on both sides of the stairs and use them. Fix handrails that are broken or loose. Make sure that handrails are as long as the stairways.  Check any carpeting to make sure that it is firmly attached to the stairs. Fix any carpet that is loose or worn.  Avoid having throw rugs at the top or bottom of the stairs. If you do have throw rugs, attach them to the floor with carpet tape.  Make sure that you have a light switch at the top of the stairs and the bottom of the stairs. If you do not have them, ask someone to add them for you. What else can I do to help prevent falls?  Wear shoes that:  Do not have high heels.  Have rubber bottoms.  Are comfortable and fit you well.  Are closed at the toe. Do not wear sandals.  If you use a stepladder:  Make sure that it is fully  opened. Do not climb a closed stepladder.  Make sure that both sides of the stepladder are locked into place.  Ask someone to hold it for you, if possible.  Clearly mark and make sure that you can see:  Any grab bars or handrails.  First and last steps.  Where the edge of each step is.  Use tools that help you move around (mobility aids) if they are needed. These include:  Canes.  Walkers.  Scooters.  Crutches.  Turn on the lights when you go into a dark area. Replace any light bulbs as soon as they burn out.  Set up your furniture so you have a clear path. Avoid moving your furniture around.  If any of your floors are uneven, fix them.  If there are any pets around you, be aware of where they are.  Review your medicines with your doctor. Some medicines can make you feel dizzy. This can increase your chance of falling. Ask your doctor what other things that you can do to help prevent falls. This information is not intended to replace advice given to you by your health care provider. Make sure you discuss any questions you have with your health care provider. Document Released: 03/29/2009 Document Revised: 11/08/2015 Document Reviewed: 07/07/2014 Elsevier Interactive Patient Education  2017 [employer].

## 2020-10-02 NOTE — Progress Notes (Signed)
I connected with Jesse Figueroa today by telephone and verified that I am speaking with the correct person using two identifiers. Location patient: home Location provider: work Persons participating in the virtual visit: Jesse Figueroa, Glenna Durand LPN.   I discussed the limitations, risks, security and privacy concerns of performing an evaluation and management service by telephone and the availability of in person appointments. I also discussed with the patient that there may be a patient responsible charge related to this service. The patient expressed understanding and verbally consented to this telephonic visit.    Interactive audio and video telecommunications were attempted between this provider and patient, however failed, due to patient having technical difficulties OR patient did not have access to video capability.  We continued and completed visit with audio only.     Vital signs may be patient reported or missing.  Subjective:   Jesse Figueroa is a 70 y.o. male who presents for Medicare Annual/Subsequent preventive examination.  Review of Systems     Cardiac Risk Factors include: advanced age (>75men, >19 women);hypertension;male gender     Objective:    Today's Vitals   10/02/20 0856  Weight: 168 lb (76.2 kg)  Height: 6' (1.829 m)   Body mass index is 22.78 kg/m.  Advanced Directives 10/02/2020 02/08/2019 09/15/2017 04/09/2015  Does Patient Have a Medical Advance Directive? Yes Yes Yes Yes  Type of Paramedic of [place];Living will Living will;Healthcare Power of Attorney Living will Living will  Copy of [place] in Chart? No - copy requested No - copy requested - No - copy requested    Current Medications (verified) Outpatient Encounter Medications as of 10/02/2020  Medication Sig  . aspirin 81 MG chewable tablet Chew by mouth.  Marland Kitchen atorvastatin (LIPITOR) 10 MG tablet TAKE 1 TABLET DAILY AT 6 PM.  . baclofen (LIORESAL) 10  MG tablet Take 0.5-1 tablets (5-10 mg total) by mouth 2 (two) times daily as needed for muscle spasms.  . calcium carbonate (TUMS - DOSED IN MG ELEMENTAL CALCIUM) 500 MG chewable tablet Chew 1 tablet by mouth daily as needed for indigestion or heartburn.  . diclofenac Sodium (VOLTAREN) 1 % GEL Apply 2 g topically 4 (four) times daily as needed (joint pain arthritis shoulder).  . fluticasone (FLONASE) 50 MCG/ACT nasal spray USE 2 SPRAYS IN BOTH NOSTRILS DAILY. USE FOR 4 TO 6 WEEKS THEN STOP AND USE SEASONALLY OR AS NEEDED.  Marland Kitchen losartan (COZAAR) 100 MG tablet TAKE 1 TABLET EVERY DAY  . propranolol (INDERAL) 20 MG tablet Take 1 tablet (20 mg total) by mouth 2 (two) times daily.   No facility-administered encounter medications on file as of 10/02/2020.    Allergies (verified) Patient has no known allergies.   History: Past Medical History:  Diagnosis Date  . Arthritis of hand   . Benign hypertension with CKD (chronic kidney disease), stage II   . CKD (chronic kidney disease), stage II   . Hyperlipidemia   . Hypertension   . Tremor    Past Surgical History:  Procedure Laterality Date  . COLONOSCOPY WITH PROPOFOL N/A 02/05/2016   Procedure: COLONOSCOPY WITH PROPOFOL;  Surgeon: Lollie Sails, MD;  Location: [facility] ENDOSCOPY;  Service: Endoscopy;  Laterality: N/A;  . EYE SURGERY  Lasic   Family History  Problem Relation Age of Onset  . Heart disease Mother   . Hypertension Mother   . Diabetes Mother   . Cancer Father    Social History  Socioeconomic History  . Marital status: Married    Spouse name: Not on file  . Number of children: Not on file  . Years of education: Not on file  . Highest education level: Not on file  Occupational History  . Occupation: Psychologist, clinical    Comment: Pension scheme manager  Tobacco Use  . Smoking status: Never Smoker  . Smokeless tobacco: Never Used  Vaping Use  . Vaping Use: Never used  Substance and Sexual Activity  . Alcohol  use: No  . Drug use: No  . Sexual activity: Yes  Other Topics Concern  . Not on file  Social History Narrative   Track coach 5 days a week    Social Determinants of Health   Financial Resource Strain: Low Risk   . Difficulty of Paying Living Expenses: Not hard at all  Food Insecurity: No Food Insecurity  . Worried About Charity fundraiser in the Last Year: Never true  . Ran Out of Food in the Last Year: Never true  Transportation Needs: No Transportation Needs  . Lack of Transportation (Medical): No  . Lack of Transportation (Non-Medical): No  Physical Activity: Inactive  . Days of Exercise per Week: 0 days  . Minutes of Exercise per Session: 0 min  Stress: No Stress Concern Present  . Feeling of Stress : Not at all  Social Connections: Moderately Integrated  . Frequency of Communication with Friends and Family: More than three times a week  . Frequency of Social Gatherings with Friends and Family: More than three times a week  . Attends Religious Services: Never  . Active Member of Clubs or Organizations: Yes  . Attends Archivist Meetings: More than 4 times per year  . Marital Status: Married    Tobacco Counseling Counseling given: Not Answered   Clinical Intake:  Pre-visit preparation completed: Yes  Pain : No/denies pain     Nutritional Risks: None Diabetes: No  How often do you need to have someone help you when you read instructions, pamphlets, or other written materials from your doctor or pharmacy?: 1 - Never  Diabetic? no  Interpreter Needed?: No  Information entered by :: NAllen LPN   Activities of Daily Living In your present state of health, do you have any difficulty performing the following activities: 10/02/2020  Hearing? N  Vision? N  Difficulty concentrating or making decisions? N  Walking or climbing stairs? N  Dressing or bathing? N  Doing errands, shopping? N  Preparing Food and eating ? N  Using the Toilet? N  In the  past six months, have you accidently leaked urine? N  Do you have problems with loss of bowel control? N  Managing your Medications? N  Managing your Finances? N  Housekeeping or managing your Housekeeping? N  Some recent data might be hidden    Patient Care Team: Olin Hauser, DO as PCP - General (Family Medicine) Vanita Ingles, RN as Registered Nurse ([place]) [place], Virl Diamond, RPH-CPP as Pharmacist  Indicate any recent Medical Services you may have received from other than [facility] providers in the past year (date may be approximate).     Assessment:   This is a routine wellness examination for [place].  Hearing/Vision screen No exam data present  Dietary issues and exercise activities discussed: Current Exercise Habits: The patient has a physically strenuous job, but has no regular exercise apart from work.  Goals    . DIET -  INCREASE WATER INTAKE     Recommend drinking at least 6-8 glasses of water a day     . Patient Stated     10/02/2020, get rid of aches and pains    . RNCM: Manage Chronic Pain     Timeframe:  Short-Term Goal Priority:  High Start Date:   09-10-2020                          Expected End Date:      12-13-2020                 Follow Up Date 10-29-2020   - call for medicine refill 2 or 3 days before it runs out - develop a personal pain management plan - keep track of prescription refills - plan exercise or activity when pain is best controlled - prioritize tasks for the day - track times pain is worst and when it is best - track what makes the pain worse and what makes it better - use ice or heat for pain relief - work slower and less intense when having pain    Why is this important?    Day-to-day life can be hard when you have chronic pain.   Pain medicine is just one piece of the treatment puzzle.   You can try these action steps to help you manage your pain.    Notes: Pain bilateral shoulders with left rotator cuff  pain      Depression Screen PHQ 2/9 Scores 10/02/2020 11/25/2019 10/27/2019 08/11/2019 07/07/2019 03/01/2019 02/08/2019  PHQ - 2 Score 0 0 0 0 0 0 0    Fall Risk Fall Risk  10/02/2020 11/25/2019 08/11/2019 03/01/2019 02/08/2019  Falls in the past year? 0 0 0 0 0  Number falls in past yr: - 0 0 - -  Injury with Fall? - 0 0 - -  Risk for fall due to : Medication side effect - - History of fall(s) -  Follow up Falls evaluation completed;Education provided;Falls prevention discussed Falls evaluation completed Falls evaluation completed - -    FALL RISK PREVENTION PERTAINING TO THE HOME:  Any stairs in or around the home? Yes  If so, are there any without handrails? No  Home free of loose throw rugs in walkways, pet beds, electrical cords, etc? Yes  Adequate lighting in your home to reduce risk of falls? Yes   ASSISTIVE DEVICES UTILIZED TO PREVENT FALLS:  Life alert? No  Use of a cane, walker or w/c? No  Grab bars in the bathroom? No  Shower chair or bench in shower? No  Elevated toilet seat or a handicapped toilet? No   TIMED UP AND GO:  Was the test performed? No .   Cognitive Function:     6CIT Screen 10/02/2020 02/08/2019 09/15/2017  What Year? 0 points 0 points 0 points  What month? 0 points 0 points 0 points  What time? 0 points 0 points 0 points  Count back from 20 0 points 0 points 0 points  Months in reverse 0 points 0 points 0 points  Repeat phrase 2 points 0 points 2 points  Total Score 2 0 2    Immunizations Immunization History  Administered Date(s) Administered  . Fluad Quad(high Dose 65+) 02/08/2019, 03/27/2020  . Influenza, High Dose Seasonal PF 03/06/2016, 06/17/2017  . Influenza,inj,quad, With Preservative 03/16/2016  . Influenza-Unspecified 03/16/2013, 04/07/2015, 02/04/2018  . PFIZER(Purple Top)SARS-COV-2 Vaccination 07/18/2019, 08/08/2019, 04/04/2020  .  Pneumococcal Conjugate-13 04/09/2015  . Pneumococcal Polysaccharide-23 05/01/2016  .  Pneumococcal-Unspecified 03/16/2016  . Tdap 06/17/2007  . Zoster 06/16/2013    TDAP status: Due, Education has been provided regarding the importance of this vaccine. Advised may receive this vaccine at local pharmacy or Health Dept. Aware to provide a copy of the vaccination record if obtained from local pharmacy or Health Dept. Verbalized acceptance and understanding.  Flu Vaccine status: Up to date  Pneumococcal vaccine status: Up to date  Covid-19 vaccine status: Completed vaccines  Qualifies for Shingles Vaccine? Yes   Zostavax completed Yes   Shingrix Completed?: No.    Education has been provided regarding the importance of this vaccine. Patient has been advised to call insurance company to determine out of pocket expense if they have not yet received this vaccine. Advised may also receive vaccine at local pharmacy or Health Dept. Verbalized acceptance and understanding.  Screening Tests Health Maintenance  Topic Date Due  . TETANUS/TDAP  06/16/2017  . INFLUENZA VACCINE  01/14/2021  . COLONOSCOPY (Pts 45-81yrs Insurance coverage will need to be confirmed)  02/04/2021  . COVID-19 Vaccine  Completed  . Hepatitis C Screening  Completed  . PNA vac Low Risk Adult  Completed  . HPV VACCINES  Aged Out    Health Maintenance  Health Maintenance Due  Topic Date Due  . TETANUS/TDAP  06/16/2017    Colorectal cancer screening: Type of screening: Colonoscopy. Completed 02/05/2016. Repeat every 5 years  Lung Cancer Screening: (Low Dose CT Chest recommended if Age 50-80 years, 30 pack-year currently smoking OR have quit w/in 15years.) does not qualify.   Lung Cancer Screening Referral: no  Additional Screening:  Hepatitis C Screening: does qualify; Completed 4/2/209  Vision Screening: Recommended annual ophthalmology exams for early detection of glaucoma and other disorders of the eye. Is the patient up to date with their annual eye exam?  Yes  Who is the provider or what is the  name of the office in which the patient attends annual eye exams? [place] If pt is not established with a provider, would they like to be referred to a provider to establish care? No .   Dental Screening: Recommended annual dental exams for proper oral hygiene  Community Resource Referral / Chronic Care Management: CRR required this visit?  No   CCM required this visit?  No      Plan:     I have personally reviewed and noted the following in the patient's chart:   . Medical and social history . Use of alcohol, tobacco or illicit drugs  . Current medications and supplements . Functional ability and status . Nutritional status . Physical activity . Advanced directives . List of other physicians . Hospitalizations, surgeries, and ER visits in previous 12 months . Vitals . Screenings to include cognitive, depression, and falls . Referrals and appointments  In addition, I have reviewed and discussed with patient certain preventive protocols, quality metrics, and best practice recommendations. A written personalized care plan for preventive services as well as general preventive health recommendations were provided to patient.     Kellie Simmering, LPN   01/27/4817   Nurse Notes:

## 2020-10-29 ENCOUNTER — Telehealth: Payer: Self-pay | Admitting: General Practice

## 2020-10-29 ENCOUNTER — Ambulatory Visit (INDEPENDENT_AMBULATORY_CARE_PROVIDER_SITE_OTHER): Payer: Medicare HMO | Admitting: General Practice

## 2020-10-29 DIAGNOSIS — I129 Hypertensive chronic kidney disease with stage 1 through stage 4 chronic kidney disease, or unspecified chronic kidney disease: Secondary | ICD-10-CM | POA: Diagnosis not present

## 2020-10-29 DIAGNOSIS — M25511 Pain in right shoulder: Secondary | ICD-10-CM

## 2020-10-29 DIAGNOSIS — E7849 Other hyperlipidemia: Secondary | ICD-10-CM | POA: Diagnosis not present

## 2020-10-29 DIAGNOSIS — N182 Chronic kidney disease, stage 2 (mild): Secondary | ICD-10-CM | POA: Diagnosis not present

## 2020-10-29 DIAGNOSIS — M7542 Impingement syndrome of left shoulder: Secondary | ICD-10-CM

## 2020-10-29 DIAGNOSIS — G8929 Other chronic pain: Secondary | ICD-10-CM

## 2020-10-29 NOTE — Patient Instructions (Signed)
Visit Information  PATIENT GOALS: Goals Addressed            This Visit's Progress   . RNCM: Manage Chronic Pain       Timeframe:  Long-Range Goal Priority:  High Start Date:   09-10-2020                          Expected End Date:      12-13-2021               Follow Up Date 12-31-2020   - call for medicine refill 2 or 3 days before it runs out - develop a personal pain management plan - keep track of prescription refills - plan exercise or activity when pain is best controlled - prioritize tasks for the day - track times pain is worst and when it is best - track what makes the pain worse and what makes it better - use ice or heat for pain relief - work slower and less intense when having pain    Why is this important?    Day-to-day life can be hard when you have chronic pain.   Pain medicine is just one piece of the treatment puzzle.   You can try these action steps to help you manage your pain.    Notes: Pain bilateral shoulders with left rotator cuff pain. 10-29-2020: The patient states his pain is about the same. He states that it is not worse but symptoms are worse in the am. Heat makes it feel better.       Patient Care Plan: RNCM: Hypertension (Adult)    Problem Identified: RNCM: Hypertension (Hypertension)   Priority: Medium    Long-Range Goal: RNCM: Hypertension Monitored   Priority: Medium  Note:   Objective:  . Last practice recorded BP readings:  BP Readings from Last 3 Encounters:  09/04/20 126/77  03/27/20 132/83  02/09/20 122/70 .   Marland Kitchen Most recent eGFR/CrCl: No results found for: EGFR  No components found for: CRCL Current Barriers:  Marland Kitchen Knowledge Deficits related to basic understanding of hypertension pathophysiology and self care management . Knowledge Deficits related to understanding of medications prescribed for management of hypertension . Does not contact provider office for questions/concerns Case Manager Clinical Goal(s):  . patient will  verbalize understanding of plan for hypertension management . patient will demonstrate improved adherence to prescribed treatment plan for hypertension as evidenced by taking all medications as prescribed, monitoring and recording blood pressure as directed, adhering to low sodium/DASH diet . patient will demonstrate improved health management independence as evidenced by checking blood pressure as directed and notifying PCP if SBP>160 or DBP > 90, taking all medications as prescribe, and adhering to a low sodium diet as discussed. . patient will verbalize basic understanding of hypertension disease process and self health management plan as evidenced by compliance with medications, compliance with heart healthy diet, and working with the CCM team to manage health and well being.  Interventions:  . Collaboration with Olin Hauser, DO regarding development and update of comprehensive plan of care as evidenced by provider attestation and co-signature . Inter-disciplinary care team collaboration (see longitudinal plan of care) . Evaluation of current treatment plan related to hypertension self management and patient's adherence to plan as established by provider. 10-29-2020: The patient is compliant with the poc and denies any new concerns related to HTN at this time. . Provided education to patient re: stroke prevention, s/s of heart  attack and stroke, DASH diet, complications of uncontrolled blood pressure. 10-29-2020: The patient states he is eating good and denies any acute findings. The patient states he is not resting as well as he would like to but does not mind the heat outside.  States his conditions are stable at this time. . Reviewed medications with patient and discussed importance of compliance . Discussed plans with patient for ongoing care management follow up and provided patient with direct contact information for care management team . Advised patient, providing education and  rationale, to monitor blood pressure daily and record, calling PCP for findings outside established parameters.  Self-Care Activities: - Self administers medications as prescribed Attends all scheduled provider appointments Calls provider office for new concerns, questions, or BP outside discussed parameters Checks BP and records as discussed Follows a low sodium diet/DASH diet Patient Goals: - check blood pressure weekly - choose a place to take my blood pressure (home, clinic or office, retail store) - write blood pressure results in a log or diary - agree on reward when goals are met - agree to work together to make changes - ask questions to understand - learn about high blood pressure - blood pressure equipment and technique reviewed - blood pressure trends reviewed - depression screen reviewed - home or ambulatory blood pressure monitoring encouraged  Follow Up Plan: Telephone follow up appointment with care management team member scheduled for: 12-31-2020 at 0945 am   Task: RNCM: Identify and Monitor Blood Pressure Elevation   Note:   Care Management Activities:    - blood pressure equipment and technique reviewed - blood pressure trends reviewed - depression screen reviewed - home or ambulatory blood pressure monitoring encouraged       Patient Care Plan: RNCM: Chronic Pain (Adult)    Problem Identified: RNCM: Pain Management Plan (Chronic Pain)   Priority: High    Long-Range Goal: RNCM: Pain Management Plan Developed   Priority: High  Note:   Current Barriers:  Marland Kitchen Knowledge Deficits related to managing acute/chronic pain . Non-adherence to scheduled provider appointments . Non-adherence to prescribed medication regimen . Difficulty obtaining medications . Chronic Disease Management support and education needs related to chronic pain . Unable to independently manage chronic pain and discomfort effectively  . Does not contact provider office for  questions/concerns Nurse Case Manager Clinical Goal(s):  . patient will verbalize understanding of plan for managing pain . patient will attend all scheduled medical appointments: patient to schedule appointment for 2 weeks . patient will demonstrate use of different relaxation  skills and/or diversional activities to assist with pain reduction (distraction, imagery, relaxation, massage, acupressure, TENS, heat, and cold application . patient will report pain at a level less than 3 to 4 on a 10-10 rating scale . patient will use pharmacological and nonpharmacological pain relief strategies . patient will verbalize acceptable level of pain relief and ability to engage in desired activities . patient will engage in desired activities without an increase in pain level Interventions:  . Collaboration with Olin Hauser, DO regarding development and update of comprehensive plan of care as evidenced by provider attestation and co-signature . Inter-disciplinary care team collaboration (see longitudinal plan of care) . - careful application of heat or ice encouraged- 10-29-2020: The patient is using heat application without difficulty. The patient states the heat does help with his pain at times. Review of 20 minutes on and then 20 minutes off.  . - deep breathing, relaxation and mindfulness use promoted . -  effectiveness of pharmacologic therapy monitored. 10-29-2020: The patient states that the gel is not very effective in helping with his pain. He has some to use.  . - medication-induced side effects managed . - misuse of pain medication assessed- 10-29-2020: No issues noted.  . - motivation and barriers to change assessed and addressed . - mutually acceptable comfort goal set . - pain assessed. 10-29-2020: The patient denies any pain at this time. States most of the time his pain is worse in the am when he first gets up. Will continue to monitor.  . - pain treatment goals reviewed. 10-29-2020:  The patient says his pain is about the same, not worse,  . - premedication prior to activity encouraged . Evaluation of current treatment plan related to pain management to pain in bilateral shoulders with left rotator cuff involvement  and patient's adherence to plan as established by provider. . Advised patient to call the office for changes in level or intensity of pain. 10-29-2020: The patient has no upcoming appointments to see the pcp. Knows to call for changes or needs. . Provided education to patient re: alternative pain relieve measures, reporting changes in pain, working with the pcp and CCM team to effectively manage pain and discomfort.  . Reviewed medications with patient and discussed compliance.  The patient states he can not see any difference in his pain level with medications use. The stimulation device is helpful. The patient will follow up with the pcp in 2 weeks for reevaluation.  . Discussed plans with patient for ongoing care management follow up and provided patient with direct contact information for care management team . Allow patient to maintain a diary of pain ratings, timing, precipitating events, medications, treatments, and what works best to relieve pain,  . Refer to support groups and self-help groups . Educate patient about the use of pharmacological interventions for pain management- antianxiety, antidepressants, NSAIDS, opioid analgesics,  . Explain the importance of lifestyle modifications to effective pain management  Patient Goals/Self Care Activities:  . - mutually acceptable comfort goal set . - pain assessed . - pain management plan developed . - pain treatment goals reviewed . - patient response to treatment assessed . - sharing of pain management plan with teachers and other caregivers encouraged . Self-administers medications as prescribed . Attends all scheduled provider appointments . Calls pharmacy for medication refills . Calls provider office for  new concerns or questions Follow Up Plan: Telephone follow up appointment with care management team member scheduled for: 10-29-2020 at 0900    imeframe:  Long-Range Goal Priority:  High Start Date:   09-10-2020                          Expected End Date:      12-13-2021               Follow Up Date 12-31-2020   - call for medicine refill 2 or 3 days before it runs out - develop a personal pain management plan - keep track of prescription refills - plan exercise or activity when pain is best controlled - prioritize tasks for the day - track times pain is worst and when it is best - track what makes the pain worse and what makes it better - use ice or heat for pain relief - work slower and less intense when having pain    Why is this important?    Day-to-day life can be hard  when you have chronic pain.   Pain medicine is just one piece of the treatment puzzle.   You can try these action steps to help you manage your pain.    Notes: Pain bilateral shoulders with left rotator cuff pain. 10-29-2020: The patient states his pain is about the same. He states that it is not worse but symptoms are worse in the am. Heat makes it feel better.    Task: Partner to Develop Chronic Pain Management Plan   Note:   Care Management Activities:    - mutually acceptable comfort goal set - pain assessed - pain management plan developed - pain treatment goals reviewed - patient response to treatment assessed - sharing of pain management plan with teachers and other caregivers encouraged       Patient Care Plan: RNCM: HLD Management    Problem Identified: Health Promotion or Disease Self-Management (General Plan of Care)     Long-Range Goal: RNCM: Management of HLD   Priority: Medium  Note:   Current Barriers:  . Poorly controlled hyperlipidemia, complicated by chronic pain . Current antihyperlipidemic regimen: Lipitor 10 mg . Most recent lipid panel:     Component Value Date/Time   CHOL  135 11/16/2019 0817   CHOL 121 10/15/2015 0914   TRIG 59 11/16/2019 0817   HDL 49 11/16/2019 0817   HDL 47 10/15/2015 0914   CHOLHDL 2.8 11/16/2019 0817   VLDL 24 08/28/2016 0001   LDLCALC 72 11/16/2019 0817 .   Marland Kitchen ASCVD risk enhancing conditions: age >58, HTN, CKD . Unable to independently manage HLD . Does not contact provider office for questions/concerns RN Care Manager Clinical Goal(s):  . patient will work with Consulting civil engineer, providers, and care team towards execution of optimized self-health management plan . patient will verbalize understanding of plan for effective management of HLD . patient will work with RNCM, pcp, and CCM team to address needs related to effective management of HLD Interventions: . Collaboration with Olin Hauser, DO regarding development and update of comprehensive plan of care as evidenced by provider attestation and co-signature . Inter-disciplinary care team collaboration (see longitudinal plan of care) . Medication review performed; medication list updated in electronic medical record.  Bertram Savin care team collaboration (see longitudinal plan of care) . Referred to pharmacy team for assistance with HLD medication management . Evaluation of current treatment plan related to HLD and patient's adherence to plan as established by provider. 10-29-2020: The patient denies any new concerns with HLD management. The patient states that he is eating well and following Heart healthy diet. Will continue to monitor.  . Advised patient to call the office for changes or questions . Provided education to patient re: heart healthy diet, activity and effective management of HLD . Discussed plans with patient for ongoing care management follow up and provided patient with direct contact information for care management team Patient Goals/Self-Care Activities: - call for medicine refill 2 or 3 days before it runs out - call if I am sick and can't take  my medicine - keep a list of all the medicines I take; vitamins and herbals too - learn to read medicine labels - use a pillbox to sort medicine - use an alarm clock or phone to remind me to take my medicine - drink 6 to 8 glasses of water each day - eat 3 to 5 servings of fruits and vegetables each day - eat 5 or 6 small meals each day - fill half  the plate with nonstarchy vegetables - limit fast food meals to no more than 1 per week - manage portion size - prepare main meal at home 3 to 5 days each week - read food labels for fat, fiber, carbohydrates and portion size - be open to making changes - I can manage, know and watch for signs of a heart attack - if I have chest pain, call for help - learn about small changes that will make a big difference - learn my personal risk factors  - advance care planning facilitated - barriers to meeting goals identified - change-talk evoked - choices provided - collaboration with team encouraged - decision-making supported - difficulty of making life-long changes acknowledged - health risks reviewed - problem-solving facilitated - questions answered - readiness for change evaluated - reassurance provided - self-reflection promoted - self-reliance encouraged  Follow Up Plan: Telephone follow up appointment with care management team member scheduled for: 12-31-2020 at 0945am     Task: RNCM: Mutually Develop and Royce Macadamia Achievement of Patient Goals   Note:   Care Management Activities:    - advance care planning facilitated - barriers to meeting goals identified - change-talk evoked - choices provided - collaboration with team encouraged - decision-making supported - difficulty of making life-long changes acknowledged - health risks reviewed - problem-solving facilitated - questions answered - readiness for change evaluated - reassurance provided - self-reflection promoted - self-reliance encouraged        Patient verbalizes  understanding of instructions provided today and agrees to view in [place].   Telephone follow up appointment with care management team member scheduled for: 12-31-2020 at [place] am  Noreene Larsson RN, MSN, [place] [facility] [facility] Mobile: [phone]

## 2020-10-29 NOTE — Chronic Care Management (AMB) (Signed)
Chronic Care Management   CCM RN Visit Note  10/29/2020 Name: DAMIANO STAMPER MRN: [id number] DOB: Apr 01, 1951  Subjective: Jesse Figueroa is a 70 y.o. year old male who is a primary care patient of Olin Hauser, DO. The care management team was consulted for assistance with disease management and care coordination needs.    Engaged with patient by telephone for follow up visit in response to provider referral for case management and/or care coordination services.   Consent to Services:  The patient was given information about Chronic Care Management services, agreed to services, and gave verbal consent prior to initiation of services.  Please see initial visit note for detailed documentation.   Patient agreed to services and verbal consent obtained.   Assessment: Review of patient past medical history, allergies, medications, health status, including review of consultants reports, laboratory and other test data, was performed as part of comprehensive evaluation and provision of chronic care management services.   SDOH (Social Determinants of Health) assessments and interventions performed:    CCM Care Plan  No Known Allergies  Outpatient Encounter Medications as of 10/29/2020  Medication Sig Note  . aspirin 81 MG chewable tablet Chew by mouth.   Marland Kitchen atorvastatin (LIPITOR) 10 MG tablet TAKE 1 TABLET DAILY AT 6 PM.   . baclofen (LIORESAL) 10 MG tablet Take 0.5-1 tablets (5-10 mg total) by mouth 2 (two) times daily as needed for muscle spasms.   . calcium carbonate (TUMS - DOSED IN MG ELEMENTAL CALCIUM) 500 MG chewable tablet Chew 1 tablet by mouth daily as needed for indigestion or heartburn. 04/20/2020: Reports using ~ once/week  . diclofenac Sodium (VOLTAREN) 1 % GEL Apply 2 g topically 4 (four) times daily as needed (joint pain arthritis shoulder).   . fluticasone (FLONASE) 50 MCG/ACT nasal spray USE 2 SPRAYS IN BOTH NOSTRILS DAILY. USE FOR 4 TO 6 WEEKS THEN STOP AND USE  SEASONALLY OR AS NEEDED.   Marland Kitchen losartan (COZAAR) 100 MG tablet TAKE 1 TABLET EVERY DAY   . propranolol (INDERAL) 20 MG tablet Take 1 tablet (20 mg total) by mouth 2 (two) times daily.    No facility-administered encounter medications on file as of 10/29/2020.    Patient Active Problem List   Diagnosis Date Noted  . Rash and nonspecific skin eruption 03/27/2020  . Influenza vaccine needed 03/27/2020  . Cyst of right kidney 04/18/2019  . CKD (chronic kidney disease), stage II 09/21/2017  . Arthritis of hand 05/01/2016  . Bradycardia 12/04/2015  . Pre-diabetes 10/15/2015  . Hyperlipidemia 07/09/2015  . Benign hypertension with CKD (chronic kidney disease), stage II 04/09/2015  . Insomnia 10/03/2014  . Essential tremor 10/03/2014    Conditions to be addressed/monitored:HTN, HLD and Chronic pain  Care Plan : RNCM: Hypertension (Adult)  Updates made by Vanita Ingles since 10/29/2020 12:00 AM    Problem: RNCM: Hypertension (Hypertension)   Priority: Medium    Long-Range Goal: RNCM: Hypertension Monitored   Priority: Medium  Note:   Objective:  . Last practice recorded BP readings:  BP Readings from Last 3 Encounters:  09/04/20 126/77  03/27/20 132/83  02/09/20 122/70 .   Marland Kitchen Most recent eGFR/CrCl: No results found for: EGFR  No components found for: CRCL Current Barriers:  Marland Kitchen Knowledge Deficits related to basic understanding of hypertension pathophysiology and self care management . Knowledge Deficits related to understanding of medications prescribed for management of hypertension . Does not contact provider office for questions/concerns Case Manager Clinical Goal(s):  .  patient will verbalize understanding of plan for hypertension management . patient will demonstrate improved adherence to prescribed treatment plan for hypertension as evidenced by taking all medications as prescribed, monitoring and recording blood pressure as directed, adhering to low sodium/DASH diet . patient  will demonstrate improved health management independence as evidenced by checking blood pressure as directed and notifying PCP if SBP>160 or DBP > 90, taking all medications as prescribe, and adhering to a low sodium diet as discussed. . patient will verbalize basic understanding of hypertension disease process and self health management plan as evidenced by compliance with medications, compliance with heart healthy diet, and working with the CCM team to manage health and well being.  Interventions:  . Collaboration with Olin Hauser, DO regarding development and update of comprehensive plan of care as evidenced by provider attestation and co-signature . Inter-disciplinary care team collaboration (see longitudinal plan of care) . Evaluation of current treatment plan related to hypertension self management and patient's adherence to plan as established by provider. 10-29-2020: The patient is compliant with the poc and denies any new concerns related to HTN at this time. . Provided education to patient re: stroke prevention, s/s of heart attack and stroke, DASH diet, complications of uncontrolled blood pressure. 10-29-2020: The patient states he is eating good and denies any acute findings. The patient states he is not resting as well as he would like to but does not mind the heat outside.  States his conditions are stable at this time. . Reviewed medications with patient and discussed importance of compliance . Discussed plans with patient for ongoing care management follow up and provided patient with direct contact information for care management team . Advised patient, providing education and rationale, to monitor blood pressure daily and record, calling PCP for findings outside established parameters.  Self-Care Activities: - Self administers medications as prescribed Attends all scheduled provider appointments Calls provider office for new concerns, questions, or BP outside discussed  parameters Checks BP and records as discussed Follows a low sodium diet/DASH diet Patient Goals: - check blood pressure weekly - choose a place to take my blood pressure (home, clinic or office, retail store) - write blood pressure results in a log or diary - agree on reward when goals are met - agree to work together to make changes - ask questions to understand - learn about high blood pressure - blood pressure equipment and technique reviewed - blood pressure trends reviewed - depression screen reviewed - home or ambulatory blood pressure monitoring encouraged  Follow Up Plan: Telephone follow up appointment with care management team member scheduled for: 12-31-2020 at 0945 am   Care Plan : RNCM: Chronic Pain (Adult)  Updates made by Vanita Ingles since 10/29/2020 12:00 AM    Problem: RNCM: Pain Management Plan (Chronic Pain)   Priority: High    Long-Range Goal: RNCM: Pain Management Plan Developed   Priority: High  Note:   Current Barriers:  Marland Kitchen Knowledge Deficits related to managing acute/chronic pain . Non-adherence to scheduled provider appointments . Non-adherence to prescribed medication regimen . Difficulty obtaining medications . Chronic Disease Management support and education needs related to chronic pain . Unable to independently manage chronic pain and discomfort effectively  . Does not contact provider office for questions/concerns Nurse Case Manager Clinical Goal(s):  . patient will verbalize understanding of plan for managing pain . patient will attend all scheduled medical appointments: patient to schedule appointment for 2 weeks . patient will demonstrate use of different  relaxation  skills and/or diversional activities to assist with pain reduction (distraction, imagery, relaxation, massage, acupressure, TENS, heat, and cold application . patient will report pain at a level less than 3 to 4 on a 10-10 rating scale . patient will use pharmacological and  nonpharmacological pain relief strategies . patient will verbalize acceptable level of pain relief and ability to engage in desired activities . patient will engage in desired activities without an increase in pain level Interventions:  . Collaboration with Smitty Cords, DO regarding development and update of comprehensive plan of care as evidenced by provider attestation and co-signature . Inter-disciplinary care team collaboration (see longitudinal plan of care) . - careful application of heat or ice encouraged- 10-29-2020: The patient is using heat application without difficulty. The patient states the heat does help with his pain at times. Review of 20 minutes on and then 20 minutes off.  . - deep breathing, relaxation and mindfulness use promoted . - effectiveness of pharmacologic therapy monitored. 10-29-2020: The patient states that the gel is not very effective in helping with his pain. He has some to use.  . - medication-induced side effects managed . - misuse of pain medication assessed- 10-29-2020: No issues noted.  . - motivation and barriers to change assessed and addressed . - mutually acceptable comfort goal set . - pain assessed. 10-29-2020: The patient denies any pain at this time. States most of the time his pain is worse in the am when he first gets up. Will continue to monitor.  . - pain treatment goals reviewed. 10-29-2020: The patient says his pain is about the same, not worse,  . - premedication prior to activity encouraged . Evaluation of current treatment plan related to pain management to pain in bilateral shoulders with left rotator cuff involvement  and patient's adherence to plan as established by provider. . Advised patient to call the office for changes in level or intensity of pain. 10-29-2020: The patient has no upcoming appointments to see the pcp. Knows to call for changes or needs. . Provided education to patient re: alternative pain relieve measures,  reporting changes in pain, working with the pcp and CCM team to effectively manage pain and discomfort.  . Reviewed medications with patient and discussed compliance.  The patient states he can not see any difference in his pain level with medications use. The stimulation device is helpful. The patient will follow up with the pcp in 2 weeks for reevaluation.  . Discussed plans with patient for ongoing care management follow up and provided patient with direct contact information for care management team . Allow patient to maintain a diary of pain ratings, timing, precipitating events, medications, treatments, and what works best to relieve pain,  . Refer to support groups and self-help groups . Educate patient about the use of pharmacological interventions for pain management- antianxiety, antidepressants, NSAIDS, opioid analgesics,  . Explain the importance of lifestyle modifications to effective pain management  Patient Goals/Self Care Activities:  . - mutually acceptable comfort goal set . - pain assessed . - pain management plan developed . - pain treatment goals reviewed . - patient response to treatment assessed . - sharing of pain management plan with teachers and other caregivers encouraged . Self-administers medications as prescribed . Attends all scheduled provider appointments . Calls pharmacy for medication refills . Calls provider office for new concerns or questions Follow Up Plan: Telephone follow up appointment with care management team member scheduled for: 10-29-2020 at 0900  imeframe:  Long-Range Goal Priority:  High Start Date:   09-10-2020                          Expected End Date:      12-13-2021               Follow Up Date 12-31-2020   - call for medicine refill 2 or 3 days before it runs out - develop a personal pain management plan - keep track of prescription refills - plan exercise or activity when pain is best controlled - prioritize tasks for the day -  track times pain is worst and when it is best - track what makes the pain worse and what makes it better - use ice or heat for pain relief - work slower and less intense when having pain    Why is this important?    Day-to-day life can be hard when you have chronic pain.   Pain medicine is just one piece of the treatment puzzle.   You can try these action steps to help you manage your pain.    Notes: Pain bilateral shoulders with left rotator cuff pain. 10-29-2020: The patient states his pain is about the same. He states that it is not worse but symptoms are worse in the am. Heat makes it feel better.    Care Plan : RNCM: HLD Management  Updates made by Vanita Ingles since 10/29/2020 12:00 AM    Problem: Health Promotion or Disease Self-Management (General Plan of Care)     Long-Range Goal: RNCM: Management of HLD   Priority: Medium  Note:   Current Barriers:  . Poorly controlled hyperlipidemia, complicated by chronic pain . Current antihyperlipidemic regimen: Lipitor 10 mg . Most recent lipid panel:     Component Value Date/Time   CHOL 135 11/16/2019 0817   CHOL 121 10/15/2015 0914   TRIG 59 11/16/2019 0817   HDL 49 11/16/2019 0817   HDL 47 10/15/2015 0914   CHOLHDL 2.8 11/16/2019 0817   VLDL 24 08/28/2016 0001   LDLCALC 72 11/16/2019 0817 .   Marland Kitchen ASCVD risk enhancing conditions: age >47, HTN, CKD . Unable to independently manage HLD . Does not contact provider office for questions/concerns RN Care Manager Clinical Goal(s):  . patient will work with Consulting civil engineer, providers, and care team towards execution of optimized self-health management plan . patient will verbalize understanding of plan for effective management of HLD . patient will work with RNCM, pcp, and CCM team to address needs related to effective management of HLD Interventions: . Collaboration with Olin Hauser, DO regarding development and update of comprehensive plan of care as evidenced by  provider attestation and co-signature . Inter-disciplinary care team collaboration (see longitudinal plan of care) . Medication review performed; medication list updated in electronic medical record.  Bertram Savin care team collaboration (see longitudinal plan of care) . Referred to pharmacy team for assistance with HLD medication management . Evaluation of current treatment plan related to HLD and patient's adherence to plan as established by provider. 10-29-2020: The patient denies any new concerns with HLD management. The patient states that he is eating well and following Heart healthy diet. Will continue to monitor.  . Advised patient to call the office for changes or questions . Provided education to patient re: heart healthy diet, activity and effective management of HLD . Discussed plans with patient for ongoing care management follow up and provided  patient with direct contact information for care management team Patient Goals/Self-Care Activities: - call for medicine refill 2 or 3 days before it runs out - call if I am sick and can't take my medicine - keep a list of all the medicines I take; vitamins and herbals too - learn to read medicine labels - use a pillbox to sort medicine - use an alarm clock or phone to remind me to take my medicine - drink 6 to 8 glasses of water each day - eat 3 to 5 servings of fruits and vegetables each day - eat 5 or 6 small meals each day - fill half the plate with nonstarchy vegetables - limit fast food meals to no more than 1 per week - manage portion size - prepare main meal at home 3 to 5 days each week - read food labels for fat, fiber, carbohydrates and portion size - be open to making changes - I can manage, know and watch for signs of a heart attack - if I have chest pain, call for help - learn about small changes that will make a big difference - learn my personal risk factors  - advance care planning facilitated - barriers to  meeting goals identified - change-talk evoked - choices provided - collaboration with team encouraged - decision-making supported - difficulty of making life-long changes acknowledged - health risks reviewed - problem-solving facilitated - questions answered - readiness for change evaluated - reassurance provided - self-reflection promoted - self-reliance encouraged  Follow Up Plan: Telephone follow up appointment with care management team member scheduled for: 12-31-2020 at 0945am       Plan:Telephone follow up appointment with care management team member scheduled for:  12-31-2020 at [place] am  Noreene Larsson RN, MSN, [place] Mobile: [phone]

## 2020-11-05 ENCOUNTER — Other Ambulatory Visit: Payer: Self-pay | Admitting: Family Medicine

## 2020-11-05 DIAGNOSIS — I10 Essential (primary) hypertension: Secondary | ICD-10-CM

## 2020-11-05 DIAGNOSIS — E7849 Other hyperlipidemia: Secondary | ICD-10-CM

## 2020-11-06 NOTE — Telephone Encounter (Signed)
Patient called and advised he is due for a follow up with Dr. Jerene Pitch as noted last OV to return in April, patient verbalized understanding. Appointment scheduled for 11/15/20 at [place].

## 2020-11-06 NOTE — Telephone Encounter (Signed)
Requested Prescriptions  Pending Prescriptions Disp Refills  . losartan (COZAAR) 100 MG tablet [Pharmacy Med Name: LOSARTAN POTASSIUM 100 MG Tablet] 30 tablet 0    Sig: TAKE 1 TABLET EVERY DAY     Cardiovascular:  Angiotensin Receptor Blockers Failed - 11/05/2020 10:03 PM      Failed - Cr in normal range and within 180 days    Creat  Date Value Ref Range Status  11/16/2019 1.17 0.70 - 1.18 mg/dL Final    Comment:    For patients >72 years of age, the reference limit for Creatinine is approximately 13% higher for people identified as African-American. .          Failed - K in normal range and within 180 days    Potassium  Date Value Ref Range Status  11/16/2019 4.5 3.5 - 5.3 mmol/L Final         Passed - Patient is not pregnant      Passed - Last BP in normal range    BP Readings from Last 1 Encounters:  09/04/20 126/77         Passed - Valid encounter within last 6 months    Recent Outpatient Visits          2 months ago Rotator cuff impingement syndrome, left   [place], DO   7 months ago Rash and nonspecific skin eruption   [facility], Lupita Raider, FNP   9 months ago Pain in right elbow   [facility] Olin Hauser, DO   11 months ago Annual physical exam   [facility] Olin Hauser, DO   1 year ago External hemorrhoid   [facility], Devonne Doughty, DO      Future Appointments            In 1 week [place], Devonne Doughty, [facility], [place]   In 11 months  [facility] [place], [place]           . atorvastatin (LIPITOR) 10 MG tablet [Pharmacy Med Name: ATORVASTATIN CALCIUM 10 MG Tablet] 30 tablet 0    Sig: TAKE 1 TABLET DAILY AT 6 PM.     Cardiovascular:  Antilipid - Statins Passed - 11/05/2020 10:03 PM      Passed - Total Cholesterol in normal range and within 360 days    Cholesterol, Total   Date Value Ref Range Status  10/15/2015 121 100 - 199 mg/dL Final   Cholesterol  Date Value Ref Range Status  11/16/2019 135 <200 mg/dL Final         Passed - LDL in normal range and within 360 days    LDL Cholesterol (Calc)  Date Value Ref Range Status  11/16/2019 72 mg/dL (calc) Final    Comment:    Reference range: <100 . Desirable range <100 mg/dL for primary prevention;   <70 mg/dL for patients with CHD or diabetic patients  with > or = 2 CHD risk factors. Marland Kitchen LDL-C is now calculated using the Martin-Hopkins  calculation, which is a validated novel method providing  better accuracy than the Friedewald equation in the  estimation of LDL-C.  Cresenciano Genre et al. Annamaria Helling. 5465;681(27): 2061-2068  (http://education.[employer].com/faq/FAQ164)          Passed - HDL in normal range and within 360 days    HDL  Date Value Ref Range Status  11/16/2019 49 > OR =  40 mg/dL Final  10/15/2015 47 >39 mg/dL Final         Passed - Triglycerides in normal range and within 360 days    Triglycerides  Date Value Ref Range Status  11/16/2019 59 <150 mg/dL Final         Passed - Patient is not pregnant      Passed - Valid encounter within last 12 months    Recent Outpatient Visits          2 months ago Rotator cuff impingement syndrome, left   [place], DO   7 months ago Rash and nonspecific skin eruption   [facility], Lupita Raider, FNP   9 months ago Pain in right elbow   [facility] Olin Hauser, DO   11 months ago Annual physical exam   [facility] Olin Hauser, DO   1 year ago External hemorrhoid   [facility], Devonne Doughty, DO      Future Appointments            In 1 week [place] [facility], Devonne Doughty, [facility], [place]   In 11 months  [facility], [place]

## 2020-11-15 ENCOUNTER — Other Ambulatory Visit: Payer: Self-pay

## 2020-11-15 ENCOUNTER — Ambulatory Visit (INDEPENDENT_AMBULATORY_CARE_PROVIDER_SITE_OTHER): Payer: Medicare HMO | Admitting: Family Medicine

## 2020-11-15 ENCOUNTER — Encounter: Payer: Self-pay | Admitting: Family Medicine

## 2020-11-15 VITALS — BP 122/80 | HR 55 | Ht 72.0 in | Wt 171.6 lb

## 2020-11-15 DIAGNOSIS — E7849 Other hyperlipidemia: Secondary | ICD-10-CM

## 2020-11-15 DIAGNOSIS — G250 Essential tremor: Secondary | ICD-10-CM

## 2020-11-15 DIAGNOSIS — I129 Hypertensive chronic kidney disease with stage 1 through stage 4 chronic kidney disease, or unspecified chronic kidney disease: Secondary | ICD-10-CM | POA: Diagnosis not present

## 2020-11-15 DIAGNOSIS — N182 Chronic kidney disease, stage 2 (mild): Secondary | ICD-10-CM | POA: Diagnosis not present

## 2020-11-15 DIAGNOSIS — Z0000 Encounter for general adult medical examination without abnormal findings: Secondary | ICD-10-CM

## 2020-11-15 DIAGNOSIS — Z1211 Encounter for screening for malignant neoplasm of colon: Secondary | ICD-10-CM | POA: Diagnosis not present

## 2020-11-15 DIAGNOSIS — R251 Tremor, unspecified: Secondary | ICD-10-CM

## 2020-11-15 DIAGNOSIS — I10 Essential (primary) hypertension: Secondary | ICD-10-CM | POA: Diagnosis not present

## 2020-11-15 DIAGNOSIS — R351 Nocturia: Secondary | ICD-10-CM | POA: Diagnosis not present

## 2020-11-15 MED ORDER — ATORVASTATIN CALCIUM 10 MG PO TABS: 10.0000 mg | ORAL_TABLET | Freq: Every day | ORAL | 3 refills | Status: DC

## 2020-11-15 MED ORDER — PROPRANOLOL HCL 20 MG PO TABS: 20.0000 mg | ORAL_TABLET | Freq: Two times a day (BID) | ORAL | 3 refills | Status: DC

## 2020-11-15 MED ORDER — LOSARTAN POTASSIUM 100 MG PO TABS: 1.0000 | ORAL_TABLET | Freq: Every day | ORAL | 3 refills | Status: DC

## 2020-11-15 NOTE — Assessment & Plan Note (Addendum)
Controlled cholesterol on statin and lifestyle Check fasting lipid  Plan: 1. Continue current meds - Atorvastatin 10mg  daily 2. Encourage improved lifestyle - low carb/cholesterol, reduce portion size, continue improving regular exercise

## 2020-11-15 NOTE — Assessment & Plan Note (Signed)
Controlled HTN - Home BP readings appropriate  Complication with CKD-II    Plan:  1. Continue current BP regimen - Losartan 100mg  daily, Propranolol 20mg  BID 2. Encourage improved lifestyle - low sodium diet, regular exercise 3. Continue monitor BP outside office, bring readings to next visit, if persistently >140/90 or new symptoms notify office sooner

## 2020-11-15 NOTE — Assessment & Plan Note (Signed)
Controlled on Propranolol

## 2020-11-15 NOTE — Progress Notes (Signed)
Subjective:    [name], male    DOB: February 18, 1951, 70 y.o.   MRN: [id number]  Jesse Figueroa is a 70 y.o. male presenting on 11/15/2020 for Hypertension and Chronic Kidney Disease   HPI    Elevated A1c /Pre-Diabetes Improved diet and more water A1c down to 6.1, previously 6.3 to 6.4 CBGs:Not check CBG at home Meds:None (never on) Currently on ARB - Family history of DM, Mother Denies hypoglycemia, polyuria, polydipsia  CHRONIC HTN CKD-II Improved creatinine Reportsdoing well. He does report some elevated BP at times, usually in afternoon, ranging 130-140 at times, not always. Checks BP on wrist regularly. Current Meds -Losartan 100mg  daily, Propranolol 20mg  BID for essential tremor and HTN Reports good compliance, took meds today. Tolerating well, w/o complaints. Lifestyle: - Diet:improving - Exercise:regular Admits rare headache Denies CP, dyspnea, edema, dizziness / lightheadedness  Kidney Cyst, non specific lesion R upper kidney 3.5cm Bosniak 73F Cystic lesion. He was advised to repeat MRI in 6 months from 04/2019 Asymptomatic. Asking about follow-up MRI  HYPERLIPIDEMIA: Last lipid panel6/2021,normalized - Currently takingAtorvastatin 10mg , tolerating well without side effects or myalgias Lifestyle - Diet:limited - Exercise:improved activity exercise now  Health Maintenance: Last colonoscopy 2017, next due in 5 years from that date, approx 2022  Depression screen [facility] 2/9 10/02/2020 11/25/2019 10/27/2019  Decreased Interest 0 0 0  Down, Depressed, Hopeless 0 0 0  PHQ - 2 Score 0 0 0    Social History   Tobacco Use  . Smoking status: Never Smoker  . Smokeless tobacco: Never Used  Vaping Use  . Vaping Use: Never used  Substance Use Topics  . Alcohol use: No  . Drug use: No    Review of Systems  Constitutional: Negative for activity change, appetite change, chills, diaphoresis, fatigue and fever.  HENT: Negative for  congestion and hearing loss.   Eyes: Negative for visual disturbance.  Respiratory: Negative for cough, chest tightness, shortness of breath and wheezing.   Cardiovascular: Negative for chest pain, palpitations and leg swelling.  Gastrointestinal: Negative for abdominal pain, constipation, diarrhea, nausea and vomiting.  Genitourinary: Negative for dysuria, frequency and hematuria.  Musculoskeletal: Negative for arthralgias and neck pain.  Skin: Negative for rash.  Neurological: Negative for dizziness, weakness, light-headedness, numbness and headaches.  Hematological: Negative for adenopathy.  Psychiatric/Behavioral: Negative for behavioral problems, dysphoric mood and sleep disturbance.   Per HPI unless specifically indicated above     Objective:    BP 122/80 (BP Location: Left Arm)   Pulse (!) 55   Ht 6' (1.829 m)   Wt 171 lb 9.6 oz (77.8 kg)   SpO2 100%   BMI 23.27 kg/m   Wt Readings from Last 3 Encounters:  11/15/20 171 lb 9.6 oz (77.8 kg)  10/02/20 168 lb (76.2 kg)  09/04/20 174 lb 9.6 oz (79.2 kg)    Physical Exam Vitals and nursing note reviewed.  Constitutional:      General: He is not in acute distress.    Appearance: He is well-developed. He is not diaphoretic.     Comments: Well-appearing, comfortable, cooperative  HENT:     Head: Normocephalic and atraumatic.  Eyes:     General:        Right eye: No discharge.        Left eye: No discharge.     Conjunctiva/sclera: Conjunctivae normal.     Pupils: Pupils are equal, round, and reactive to light.  Neck:     Thyroid:  No thyromegaly.  Cardiovascular:     Rate and Rhythm: Normal rate and regular rhythm.     Heart sounds: Normal heart sounds. No murmur heard.   Pulmonary:     Effort: Pulmonary effort is normal. No respiratory distress.     Breath sounds: Normal breath sounds. No wheezing or rales.  Abdominal:     General: Bowel sounds are normal. There is no distension.     Palpations: Abdomen is soft.  There is no mass.     Tenderness: There is no abdominal tenderness.  Musculoskeletal:        General: No tenderness. Normal range of motion.     Cervical back: Normal range of motion and neck supple.     Comments: Upper / Lower Extremities: - Normal muscle tone, strength bilateral upper extremities 5/5, lower extremities 5/5  Lymphadenopathy:     Cervical: No cervical adenopathy.  Skin:    General: Skin is warm and dry.     Findings: No erythema or rash.  Neurological:     Mental Status: He is alert and oriented to person, place, and time.     Comments: Distal sensation intact to light touch all extremities  Psychiatric:        Behavior: Behavior normal.     Comments: Well groomed, good eye contact, normal speech and thoughts        Results for orders placed or performed in visit on 11/16/19  TSH  Result Value Ref Range   TSH 3.15 0.40 - 4.50 mIU/L  PSA  Result Value Ref Range   PSA 0.9 < OR = 4.0 ng/mL  Lipid panel  Result Value Ref Range   Cholesterol 135 <200 mg/dL   HDL 49 > OR = 40 mg/dL   Triglycerides 59 <150 mg/dL   LDL Cholesterol (Calc) 72 mg/dL (calc)   Total CHOL/HDL Ratio 2.8 <5.0 (calc)   Non-HDL Cholesterol (Calc) 86 <130 mg/dL (calc)  COMPLETE METABOLIC PANEL WITH GFR  Result Value Ref Range   Glucose, Bld 105 (H) 65 - 99 mg/dL   BUN 17 7 - 25 mg/dL   Creat 1.17 0.70 - 1.18 mg/dL   GFR, Est Non African American 62 > OR = 60 mL/min/1.57m2   GFR, Est African American 72 > OR = 60 mL/min/1.60m2   BUN/Creatinine Ratio NOT APPLICABLE 6 - 22 (calc)   Sodium 141 135 - 146 mmol/L   Potassium 4.5 3.5 - 5.3 mmol/L   Chloride 104 98 - 110 mmol/L   CO2 31 20 - 32 mmol/L   Calcium 9.5 8.6 - 10.3 mg/dL   Total Protein 6.8 6.1 - 8.1 g/dL   Albumin 4.2 3.6 - 5.1 g/dL   Globulin 2.6 1.9 - 3.7 g/dL (calc)   AG Ratio 1.6 1.0 - 2.5 (calc)   Total Bilirubin 0.5 0.2 - 1.2 mg/dL   Alkaline phosphatase (APISO) 99 35 - 144 U/L   AST 22 10 - 35 U/L   ALT 22 9 - 46 U/L   CBC with Differential/Platelet  Result Value Ref Range   WBC 4.1 3.8 - 10.8 Thousand/uL   RBC 5.14 4.20 - 5.80 Million/uL   Hemoglobin 14.2 13.2 - 17.1 g/dL   HCT 44.8 38.5 - 50.0 %   MCV 87.2 80.0 - 100.0 fL   MCH 27.6 27.0 - 33.0 pg   MCHC 31.7 (L) 32.0 - 36.0 g/dL   RDW 13.1 11.0 - 15.0 %   Platelets 292 140 - 400 Thousand/uL  MPV 11.1 7.5 - 12.5 fL   Neutro Abs 1,427 (L) 1,500 - 7,800 cells/uL   Lymphs Abs 1,989 850 - 3,900 cells/uL   Absolute Monocytes 455 200 - 950 cells/uL   Eosinophils Absolute 201 15 - 500 cells/uL   Basophils Absolute 29 0 - 200 cells/uL   Neutrophils Relative % 34.8 %   Total Lymphocyte 48.5 %   Monocytes Relative 11.1 %   Eosinophils Relative 4.9 %   Basophils Relative 0.7 %  Hemoglobin A1c  Result Value Ref Range   Hgb A1c MFr Bld 6.1 (H) <5.7 % of total Hgb   Mean Plasma Glucose 128 (calc)   eAG (mmol/L) 7.1 (calc)      Assessment & Plan:   Problem List Items Addressed This Visit    Hyperlipidemia    Controlled cholesterol on statin and lifestyle Check fasting lipid  Plan: 1. Continue current meds - Atorvastatin 10mg  daily 2. Encourage improved lifestyle - low carb/cholesterol, reduce portion size, continue improving regular exercise      Relevant Medications   atorvastatin (LIPITOR) 10 MG tablet   losartan (COZAAR) 100 MG tablet   propranolol (INDERAL) 20 MG tablet   Other Relevant Orders   COMPLETE METABOLIC PANEL WITH GFR   Lipid panel   Essential tremor    Controlled on Propranolol      Relevant Medications   propranolol (INDERAL) 20 MG tablet   Benign hypertension with CKD (chronic kidney disease), stage II    Controlled HTN - Home BP readings appropriate  Complication with CKD-II    Plan:  1. Continue current BP regimen - Losartan 100mg  daily, Propranolol 20mg  BID 2. Encourage improved lifestyle - low sodium diet, regular exercise 3. Continue monitor BP outside office, bring readings to next visit, if persistently  >140/90 or new symptoms notify office sooner      Relevant Medications   atorvastatin (LIPITOR) 10 MG tablet   losartan (COZAAR) 100 MG tablet   propranolol (INDERAL) 20 MG tablet   Other Relevant Orders   CBC with Differential/Platelet   COMPLETE METABOLIC PANEL WITH GFR    Other Visit Diagnoses    Annual physical exam    -  Primary   Relevant Orders   Hemoglobin A1c   CBC with Differential/Platelet   COMPLETE METABOLIC PANEL WITH GFR   Lipid panel   Screening for colon cancer       Relevant Orders   Ambulatory referral to Gastroenterology   Nocturia       Relevant Orders   PSA   Essential hypertension       Relevant Medications   atorvastatin (LIPITOR) 10 MG tablet   losartan (COZAAR) 100 MG tablet   propranolol (INDERAL) 20 MG tablet   Has a tremor       Relevant Medications   propranolol (INDERAL) 20 MG tablet      Updated Health Maintenance information - referral to [facility] for colonoscopy, due from 01/2016 now 5 year later repeat - 2nd covid booster due at pharmacy Fasting labs ordered Encouraged improvement to lifestyle with diet and exercise Goal of weight loss   Meds ordered this encounter  Medications  . atorvastatin (LIPITOR) 10 MG tablet    Sig: Take 1 tablet (10 mg total) by mouth daily.    Dispense:  90 tablet    Refill:  3  . losartan (COZAAR) 100 MG tablet    Sig: Take 1 tablet (100 mg total) by mouth daily.    Dispense:  90 tablet    Refill:  3  . propranolol (INDERAL) 20 MG tablet    Sig: Take 1 tablet (20 mg total) by mouth 2 (two) times daily.    Dispense:  180 tablet    Refill:  3     Follow up plan: Return in about 1 day (around 11/16/2020) for fasting lab only 1 day tomorrow 6/3.    [name], DO [facility] 11/15/2020, 8:33 AM

## 2020-11-15 NOTE — Patient Instructions (Addendum)
Thank you for coming to the office today.  2nd covid booster anytime you are ready at pharmacy.  meds refilled [facility].  Blood work tomorrow, fasting.  Please schedule a Follow-up Appointment to: Return in about 1 day (around 11/16/2020) for fasting lab only 1 day tomorrow 6/3.  If you have any other questions or concerns, please feel free to call the office or send a message through [place]. You may also schedule an earlier appointment if necessary.  Additionally, you may be receiving a survey about your experience at our office within a few days to 1 week by e-mail or mail. We value your feedback.  [name], DO [place]

## 2020-11-16 ENCOUNTER — Other Ambulatory Visit: Payer: Medicare HMO

## 2020-11-16 DIAGNOSIS — N182 Chronic kidney disease, stage 2 (mild): Secondary | ICD-10-CM | POA: Diagnosis not present

## 2020-11-16 DIAGNOSIS — Z0000 Encounter for general adult medical examination without abnormal findings: Secondary | ICD-10-CM | POA: Diagnosis not present

## 2020-11-16 DIAGNOSIS — E7849 Other hyperlipidemia: Secondary | ICD-10-CM

## 2020-11-16 DIAGNOSIS — R351 Nocturia: Secondary | ICD-10-CM

## 2020-11-16 DIAGNOSIS — E119 Type 2 diabetes mellitus without complications: Secondary | ICD-10-CM | POA: Diagnosis not present

## 2020-11-16 DIAGNOSIS — I129 Hypertensive chronic kidney disease with stage 1 through stage 4 chronic kidney disease, or unspecified chronic kidney disease: Secondary | ICD-10-CM | POA: Diagnosis not present

## 2020-11-16 LAB — HM DIABETES EYE EXAM

## 2020-11-17 LAB — HEMOGLOBIN A1C
Hgb A1c MFr Bld: 6.3 % of total Hgb — ABNORMAL HIGH (ref <5.7)
Mean Plasma Glucose: 134 mg/dL
eAG (mmol/L): 7.4 mmol/L

## 2020-11-17 LAB — CBC WITH DIFFERENTIAL/PLATELET
Absolute Monocytes: 417 cells/uL (ref 200–950)
Basophils Absolute: 30 cells/uL (ref 0–200)
Basophils Relative: 0.7 %
Eosinophils Absolute: 387 cells/uL (ref 15–500)
Eosinophils Relative: 9 %
HCT: 43.6 % (ref 38.5–50.0)
Hemoglobin: 13.9 g/dL (ref 13.2–17.1)
Lymphs Abs: 1883 cells/uL (ref 850–3900)
MCH: 27.3 pg (ref 27.0–33.0)
MCHC: 31.9 g/dL — ABNORMAL LOW (ref 32.0–36.0)
MCV: 85.7 fL (ref 80.0–100.0)
MPV: 10.2 fL (ref 7.5–12.5)
Monocytes Relative: 9.7 %
Neutro Abs: 1582 cells/uL (ref 1500–7800)
Neutrophils Relative %: 36.8 %
Platelets: 296 10*3/uL (ref 140–400)
RBC: 5.09 10*6/uL (ref 4.20–5.80)
RDW: 13.1 % (ref 11.0–15.0)
Total Lymphocyte: 43.8 %
WBC: 4.3 10*3/uL (ref 3.8–10.8)

## 2020-11-17 LAB — COMPLETE METABOLIC PANEL WITH GFR
AG Ratio: 1.6 (calc) (ref 1.0–2.5)
ALT: 24 U/L (ref 9–46)
AST: 20 U/L (ref 10–35)
Albumin: 4.2 g/dL (ref 3.6–5.1)
Alkaline phosphatase (APISO): 99 U/L (ref 35–144)
BUN/Creatinine Ratio: 13 (calc) (ref 6–22)
BUN: 15 mg/dL (ref 7–25)
CO2: 28 mmol/L (ref 20–32)
Calcium: 9.4 mg/dL (ref 8.6–10.3)
Chloride: 105 mmol/L (ref 98–110)
Creat: 1.19 mg/dL — ABNORMAL HIGH (ref 0.70–1.18)
GFR, Est African American: 70 mL/min/{1.73_m2} (ref >=60)
GFR, Est Non African American: 61 mL/min/{1.73_m2} (ref >=60)
Globulin: 2.7 g/dL (calc) (ref 1.9–3.7)
Glucose, Bld: 101 mg/dL — ABNORMAL HIGH (ref 65–99)
Potassium: 4.3 mmol/L (ref 3.5–5.3)
Sodium: 140 mmol/L (ref 135–146)
Total Bilirubin: 0.7 mg/dL (ref 0.2–1.2)
Total Protein: 6.9 g/dL (ref 6.1–8.1)

## 2020-11-17 LAB — LIPID PANEL
Cholesterol: 144 mg/dL (ref <200)
HDL: 48 mg/dL (ref >=40)
LDL Cholesterol (Calc): 76 mg/dL (calc)
Non-HDL Cholesterol (Calc): 96 mg/dL (calc) (ref <130)
Total CHOL/HDL Ratio: 3 (calc) (ref <5.0)
Triglycerides: 110 mg/dL (ref <150)

## 2020-11-17 LAB — PSA: PSA: 0.91 ng/mL (ref <=4.00)

## 2020-12-31 ENCOUNTER — Telehealth: Payer: Self-pay

## 2020-12-31 ENCOUNTER — Telehealth: Payer: Self-pay | Admitting: General Practice

## 2020-12-31 NOTE — Telephone Encounter (Signed)
  Care Management   Follow Up Note   12/31/2020 Name: Jesse Figueroa MRN: [id number] DOB: April 27, 1951   Referred by: Olin Hauser, DO Reason for referral : Chronic Care Management (RNCM: Follow up for Chronic Disease Management and Care Coordination Needs )   An unsuccessful telephone outreach was attempted today. The patient was referred to the case management team for assistance with care management and care coordination.   Follow Up Plan: A HIPPA compliant phone message was left for the patient providing contact information and requesting a return call.  Noreene Larsson RN, MSN, [place] Mobile: [phone]

## 2021-01-07 ENCOUNTER — Telehealth: Payer: Self-pay

## 2021-01-07 NOTE — Chronic Care Management (AMB) (Signed)
  Care Management   Note  01/07/2021 Name: Jesse Figueroa MRN: [id number] DOB: 06/27/1950  Jesse Figueroa is a 70 y.o. year old male who is a primary care patient of Olin Hauser, DO and is actively engaged with the care management team. I reached out to Frederico Hamman by phone today to assist with re-scheduling a follow up visit with the RN Case Manager  Follow up plan: Unsuccessful telephone outreach attempt made. A HIPAA compliant phone message was left for the patient providing contact information and requesting a return call.  The care management team will reach out to the patient again over the next 7 days.  If patient returns call to provider office, please advise to call [facility]  [facility] [place], [address], [place], [place] [address] Direct Dial: [phone] [email or web address]: [email or web address]

## 2021-01-14 ENCOUNTER — Telehealth: Payer: Self-pay

## 2021-01-21 ENCOUNTER — Telehealth: Payer: Medicare HMO | Admitting: General Practice

## 2021-01-21 ENCOUNTER — Ambulatory Visit (INDEPENDENT_AMBULATORY_CARE_PROVIDER_SITE_OTHER): Payer: Medicare HMO | Admitting: General Practice

## 2021-01-21 DIAGNOSIS — I10 Essential (primary) hypertension: Secondary | ICD-10-CM

## 2021-01-21 DIAGNOSIS — E7849 Other hyperlipidemia: Secondary | ICD-10-CM

## 2021-01-21 DIAGNOSIS — N182 Chronic kidney disease, stage 2 (mild): Secondary | ICD-10-CM

## 2021-01-21 DIAGNOSIS — M25511 Pain in right shoulder: Secondary | ICD-10-CM

## 2021-01-21 DIAGNOSIS — I129 Hypertensive chronic kidney disease with stage 1 through stage 4 chronic kidney disease, or unspecified chronic kidney disease: Secondary | ICD-10-CM | POA: Diagnosis not present

## 2021-01-21 DIAGNOSIS — G8929 Other chronic pain: Secondary | ICD-10-CM

## 2021-01-21 NOTE — Patient Instructions (Signed)
Visit Information  PATIENT GOALS:  Goals Addressed             This Visit's Progress    RNCM: Manage Chronic Pain       Timeframe:  Long-Range Goal Priority:  High Start Date:   09-10-2020                          Expected End Date:      12-13-2021               Follow Up Date 04-01-2021   - call for medicine refill 2 or 3 days before it runs out - develop a personal pain management plan - keep track of prescription refills - plan exercise or activity when pain is best controlled - prioritize tasks for the day - track times pain is worst and when it is best - track what makes the pain worse and what makes it better - use ice or heat for pain relief - work slower and less intense when having pain    Why is this important?   Day-to-day life can be hard when you have chronic pain.  Pain medicine is just one piece of the treatment puzzle.  You can try these action steps to help you manage your pain.    Notes: Pain bilateral shoulders with left rotator cuff pain. 10-29-2020: The patient states his pain is about the same. He states that it is not worse but symptoms are worse in the am. Heat makes it feel better. 01-21-2021: The patient states that his pain is actually better. He has not had pain in the last several days. The patient says it comes and goes. He feels it is stable at this time. Will continue to monitor.         Patient verbalizes understanding of instructions provided today and agrees to view in [place].   Telephone follow up appointment with care management team member scheduled for: 04-01-2021 at 0900 am  Noreene Larsson RN, MSN, [place] Mobile: [phone]

## 2021-01-21 NOTE — Chronic Care Management (AMB) (Signed)
Chronic Care Management   CCM RN Visit Note  01/21/2021 Name: Jesse Figueroa MRN: [id number] DOB: 1950/12/09  Subjective: Jesse Figueroa is a 70 y.o. year old male who is a primary care patient of Olin Hauser, DO. The care management team was consulted for assistance with disease management and care coordination needs.    Engaged with patient by telephone for follow up visit in response to provider referral for case management and/or care coordination services.   Consent to Services:  The patient was given information about Chronic Care Management services, agreed to services, and gave verbal consent prior to initiation of services.  Please see initial visit note for detailed documentation.   Patient agreed to services and verbal consent obtained.   Assessment: Review of patient past medical history, allergies, medications, health status, including review of consultants reports, laboratory and other test data, was performed as part of comprehensive evaluation and provision of chronic care management services.   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    atorvastatin (LIPITOR) 10 MG tablet Take 1 tablet (10 mg total) by mouth daily.    calcium carbonate (TUMS - DOSED IN MG ELEMENTAL CALCIUM) 500 MG chewable tablet Chew 1 tablet by mouth daily as needed for indigestion or heartburn. 04/20/2020: Reports using ~ once/week   diclofenac Sodium (VOLTAREN) 1 % GEL Apply 2 g topically 4 (four) times daily as needed (joint pain arthritis shoulder).    fluticasone (FLONASE) 50 MCG/ACT nasal spray USE  2 SPRAYS IN BOTH NOSTRILS DAILY. USE FOR 4 TO 6 WEEKS THEN STOP AND USE SEASONALLY OR AS NEEDED.    losartan (COZAAR) 100 MG tablet Take 1 tablet (100 mg total) by mouth daily.    propranolol (INDERAL) 20 MG tablet Take 1 tablet (20 mg total) by mouth 2 (two) times daily.    No facility-administered encounter medications on file as of 01/21/2021.    Patient Active Problem List   Diagnosis Date Noted   Rash and nonspecific skin eruption 03/27/2020   Influenza vaccine needed 03/27/2020   Cyst of right kidney 04/18/2019   CKD (chronic kidney disease), stage II 09/21/2017   Arthritis of hand 05/01/2016   Bradycardia 12/04/2015   Pre-diabetes 10/15/2015   Hyperlipidemia 07/09/2015   Benign hypertension with CKD (chronic kidney disease), stage II 04/09/2015   Insomnia 10/03/2014   Essential tremor 10/03/2014    Conditions to be addressed/monitored:HTN, HLD, and Chronic pain in bilateral shoulders  Care Plan : RNCM: Hypertension (Adult)  Updates made by Vanita Ingles since 01/21/2021 12:00 AM     Problem: RNCM: Hypertension (Hypertension)   Priority: Medium     Long-Range Goal: RNCM: Hypertension Monitored   Start Date: 09/10/2020  Expected End Date: 01/16/2022  This Visit's Progress: On track  Priority: Medium  Note:   Objective:  Last practice recorded BP readings:  BP Readings from Last 3 Encounters:  11/15/20 122/80  09/04/20 126/77  03/27/20 132/83    Most recent eGFR/CrCl: No results found for: EGFR  No components found for: CRCL Current Barriers:  Knowledge Deficits related to  basic understanding of hypertension pathophysiology and self care management Knowledge Deficits related to understanding of medications prescribed for management of hypertension Does not contact provider office for questions/concerns Case Manager Clinical Goal(s):  patient will verbalize understanding of plan for hypertension management patient will demonstrate improved adherence to prescribed  treatment plan for hypertension as evidenced by taking all medications as prescribed, monitoring and recording blood pressure as directed, adhering to low sodium/DASH diet patient will demonstrate improved health management independence as evidenced by checking blood pressure as directed and notifying PCP if SBP>160 or DBP > 90, taking all medications as prescribe, and adhering to a low sodium diet as discussed. patient will verbalize basic understanding of hypertension disease process and self health management plan as evidenced by compliance with medications, compliance with heart healthy diet, and working with the CCM team to manage health and well being.  Interventions:  Collaboration with Olin Hauser, DO regarding development and update of comprehensive plan of care as evidenced by provider attestation and co-signature Inter-disciplinary care team collaboration (see longitudinal plan of care) Evaluation of current treatment plan related to hypertension self management and patient's adherence to plan as established by provider. 01-21-2021: The patient is compliant with the poc and denies any new concerns related to HTN at this time. Follow up with the pcp in June. No new concerns. Did go on 11-19-2020 to get 2nd booster shot for COVID- record updated.  Discussed flu vaccine and the patient states he received in the office. The patient has follow up consultation for colonoscopy on 01-28-2021. Provided education to patient re: stroke prevention, s/s of heart attack and stroke, DASH diet, complications of uncontrolled blood pressure. 01-28-2021: The patient states he is eating good and denies any acute findings. The patient states he is not resting as well as he would like to.  He says he has no problem going to sleep but if he wakes up it is hard for him to go back to sleep. Discussed sleep hygiene.  He does not mind the heat outside.  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 He also mows his yard. He stays pretty active.   States his conditions are stable at this time. Reviewed medications with patient and discussed importance of compliance. 01-21-2021: Verbalized compliance with his medications regimen.  Discussed plans with patient for ongoing care management follow up and provided patient with direct contact information for care management team Advised patient, providing education and rationale, to monitor blood pressure daily and record, calling PCP for findings outside established parameters.  Self-Care Activities: - Self administers medications as prescribed Attends all scheduled provider appointments Calls provider office for new concerns, questions, or BP outside discussed parameters Checks BP and records as discussed Follows a low sodium diet/DASH diet Patient Goals: - check blood pressure weekly - choose a place to take my blood pressure (home, clinic or office, retail store) - write blood pressure results in a log or diary - agree on reward when goals are met - agree to work together to make changes - ask questions to understand - learn about high blood pressure - blood pressure equipment and technique reviewed - blood pressure trends reviewed - depression screen reviewed - home or ambulatory blood pressure monitoring encouraged  Follow Up Plan: Telephone follow up appointment with care management team member scheduled for: 04-01-2021 at 0900 am    Task: RNCM: Identify and Monitor Blood Pressure Elevation Completed 01/21/2021  Outcome: Positive  Note:   Care Management Activities:    - blood pressure equipment and technique reviewed - blood pressure trends reviewed - depression screen reviewed - home or ambulatory blood pressure monitoring encouraged        Care Plan : RNCM: Chronic Pain (Adult)  Updates made by Vanita Ingles since 01/21/2021 12:00 AM     Problem: RNCM: Pain Management Plan (Chronic Pain)   Priority: High      Long-Range Goal: RNCM: Pain Management Plan Developed   Start Date: 09/10/2020  Expected End Date: 01/16/2022  This Visit's Progress: On track  Priority: High  Note:   Current Barriers:  Knowledge Deficits related to managing acute/chronic pain Non-adherence to scheduled provider appointments Non-adherence to prescribed medication regimen Difficulty obtaining medications Chronic Disease Management support and education needs related to chronic pain Unable to independently manage chronic pain and discomfort effectively  Does not contact provider office for questions/concerns Nurse Case Manager Clinical Goal(s):  patient will verbalize understanding of plan for managing pain patient will attend all scheduled medical appointments: patient to schedule appointment for 2 weeks patient will demonstrate use of different relaxation  skills and/or diversional activities to assist with pain reduction (distraction, imagery, relaxation, massage, acupressure, TENS, heat, and cold application patient will report pain at a level less than 3 to 4 on a 10-10 rating scale patient will use pharmacological and nonpharmacological pain relief strategies patient will verbalize acceptable level of pain relief and ability to engage in desired activities patient will engage in desired activities without an increase in pain level Interventions:  Collaboration with Parks Ranger Devonne Doughty, DO regarding development and update of comprehensive plan of care as evidenced by provider attestation and co-signature Inter-disciplinary care team collaboration (see longitudinal plan of care) - careful application of heat or ice encouraged- 10-29-2020: The patient is using heat application without difficulty. The patient states the heat does help with his pain at times. Review of 20 minutes on and then 20 minutes off.  - deep breathing, relaxation and mindfulness use promoted - effectiveness of pharmacologic therapy monitored.  01-21-2021: The patient states that the gel is not very effective in helping with his pain. He has some to use.  - medication-induced side effects managed - misuse of pain medication assessed- 01-21-2021: No issues noted.  - motivation and barriers to change assessed and addressed - mutually acceptable comfort goal set - pain assessed. 01-21-2021: The patient denies any pain at this time. States most of the time his pain is worse in the am when he first gets up. Will continue to monitor. The patient states he has not had pain in several days. He monitors closely. He denies any bad flare ups with his pain. States it comes and goes. He has not been limited in the activity he does. Will continue to monitor for changes in pain.  - pain treatment goals reviewed. 01-21-2021: The patient says his pain is about the same, not worse,  - premedication prior to activity encouraged Evaluation of current treatment plan related to pain management to pain in bilateral shoulders with left rotator cuff involvement  and patient's adherence to plan as established by provider. Advised patient to call the office for changes in level or intensity of pain. 8-8--2022: The patient has no upcoming appointments to see the pcp. Knows to call for changes or needs. Saw pcp in June for his yearly physical  Provided education to patient re: alternative pain relieve measures, reporting changes in pain, working with the pcp  and CCM team to effectively manage pain and discomfort.  Reviewed medications with patient and discussed compliance.  The patient states he can not see any difference in his pain level with medications use. The stimulation device is helpful. The patient will follow up with the pcp in 2 weeks for reevaluation. 01-21-2021: The patient is compliant with medications. No issues noted.  Discussed plans with patient for ongoing care management follow up and provided patient with direct contact information for care management  team Allow patient to maintain a diary of pain ratings, timing, precipitating events, medications, treatments, and what works best to relieve pain,  Refer to support groups and self-help groups Educate patient about the use of pharmacological interventions for pain management- antianxiety, antidepressants, NSAIDS, opioid analgesics,  Explain the importance of lifestyle modifications to effective pain management  Patient Goals/Self Care Activities:  - mutually acceptable comfort goal set - pain assessed - pain management plan developed - pain treatment goals reviewed - patient response to treatment assessed - sharing of pain management plan with teachers and other caregivers encouraged Self-administers medications as prescribed Attends all scheduled provider appointments Calls pharmacy for medication refills Calls provider office for new concerns or questions Follow Up Plan: Telephone follow up appointment with care management team member scheduled for: 04-01-2021 at 0900        Task: Partner to Develop Chronic Pain Management Plan Completed 01/21/2021  Outcome: Positive  Note:   Care Management Activities:    - mutually acceptable comfort goal set - pain assessed - pain management plan developed - pain treatment goals reviewed - patient response to treatment assessed - sharing of pain management plan with teachers and other caregivers encouraged        Care Plan : RNCM: HLD Management  Updates made by Vanita Ingles since 01/21/2021 12:00 AM     Problem: Health Promotion or Disease Self-Management (General Plan of Care)      Long-Range Goal: RNCM: Management of HLD   Start Date: 09/10/2020  Expected End Date: 01/16/2022  This Visit's Progress: On track  Priority: Medium  Note:   Current Barriers:  Poorly controlled hyperlipidemia, complicated by chronic pain Current antihyperlipidemic regimen: Lipitor 10 mg Most recent lipid panel:  Lab Results  Component Value Date    CHOL 144 11/16/2020   CHOL 135 11/16/2019   CHOL 195 11/23/2018   Lab Results  Component Value Date   HDL 48 11/16/2020   HDL 49 11/16/2019   HDL 46 11/23/2018   Lab Results  Component Value Date   LDLCALC 76 11/16/2020   LDLCALC 72 11/16/2019   LDLCALC 130 (H) 11/23/2018   Lab Results  Component Value Date   TRIG 110 11/16/2020   TRIG 59 11/16/2019   TRIG 86 11/23/2018   Lab Results  Component Value Date   CHOLHDL 3.0 11/16/2020   CHOLHDL 2.8 11/16/2019   CHOLHDL 4.2 11/23/2018   No results found for: LDLDIRECT  ASCVD risk enhancing conditions: age >44, HTN, CKD Unable to independently manage HLD Does not contact provider office for questions/concerns RN Care Manager Clinical Goal(s):  patient will work with Consulting civil engineer, providers, and care team towards execution of optimized self-health management plan patient will verbalize understanding of plan for effective management of HLD patient will work with RNCM, pcp, and CCM team to address needs related to effective management of HLD Interventions: Collaboration with Olin Hauser, DO regarding development and update of comprehensive plan of care as evidenced by provider attestation  and co-signature Inter-disciplinary care team collaboration (see longitudinal plan of care) Medication review performed; medication list updated in electronic medical record.  Inter-disciplinary care team collaboration (see longitudinal plan of care) Referred to pharmacy team for assistance with HLD medication management Evaluation of current treatment plan related to HLD and patient's adherence to plan as established by provider. 01-21-2021: The patient denies any new concerns with HLD management. The patient states that he is eating well and following Heart healthy diet. Will continue to monitor. Labs at recent physical were WNL. Praised the patient for good results.  Advised patient to call the office for changes or  questions Provided education to patient re: heart healthy diet, activity and effective management of HLD Discussed plans with patient for ongoing care management follow up and provided patient with direct contact information for care management team Patient Goals/Self-Care Activities: - call for medicine refill 2 or 3 days before it runs out - call if I am sick and can't take my medicine - keep a list of all the medicines I take; vitamins and herbals too - learn to read medicine labels - use a pillbox to sort medicine - use an alarm clock or phone to remind me to take my medicine - drink 6 to 8 glasses of water each day - eat 3 to 5 servings of fruits and vegetables each day - eat 5 or 6 small meals each day - fill half the plate with nonstarchy vegetables - limit fast food meals to no more than 1 per week - manage portion size - prepare main meal at home 3 to 5 days each week - read food labels for fat, fiber, carbohydrates and portion size - be open to making changes - I can manage, know and watch for signs of a heart attack - if I have chest pain, call for help - learn about small changes that will make a big difference - learn my personal risk factors  - advance care planning facilitated - barriers to meeting goals identified - change-talk evoked - choices provided - collaboration with team encouraged - decision-making supported - difficulty of making life-long changes acknowledged - health risks reviewed - problem-solving facilitated - questions answered - readiness for change evaluated - reassurance provided - self-reflection promoted - self-reliance encouraged  Follow Up Plan: Telephone follow up appointment with care management team member scheduled for: 04-01-2021 at 0900 am      Task: RNCM: Mutually Develop and Royce Macadamia Achievement of Patient Goals Completed 01/21/2021  Outcome: Positive  Note:   Care Management Activities:    - advance care planning  facilitated - barriers to meeting goals identified - change-talk evoked - choices provided - collaboration with team encouraged - decision-making supported - difficulty of making life-long changes acknowledged - health risks reviewed - problem-solving facilitated - questions answered - readiness for change evaluated - reassurance provided - self-reflection promoted - self-reliance encouraged         Plan:Telephone follow up appointment with care management team member scheduled for:  04-01-2021 at 0900 am  Noreene Larsson RN, MSN, [place] Mobile: [phone]

## 2021-02-07 NOTE — Progress Notes (Signed)
 PATIENT PROFILE: Jesse Figueroa is a 70 y.o. male who presents for a visit to the GI clinic at [facility]  for a visit to follow up  PROBLEM LIST:  Patient Active Problem List  Diagnosis  . Difficulty sleeping  . Type 2 diabetes mellitus without complications (CMS-HCC)  . Benign essential tremor  . 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 Dry eye syndrome of both eyes  . Age-related nuclear cataract of both eyes  . Disorder of refraction    Endoscopic History:  Colonoscopy 02/05/16- Dr Gaylyn- couple of polyps which were benign lymphoid aggregates on pathology Colonoscopy 2012-hyperplastic polyps Colonoscopy 2004- hyperplastic polyp   INTERVAL HISTORY:  Jesse Figueroa was last seen on 11/16/15 to arrange screening colonoscopy due to his brother's history of colon polyps, done as above. Since the last visit, states he has been well. Denies abdominal pain, dyspepsia, NVD, problems swallowing, bowel habit changes, melena/hematochezia, unplanned loss of weight, and all other GI related complaints.  Recent cmp/cbc unremarkable Denies any problems with sedated procedures  Medications:  Outpatient Encounter Medications as of 02/07/2021  Medication Sig Dispense Refill  . aspirin 81 MG chewable tablet Take 81 mg by mouth once daily    . atorvastatin  (LIPITOR) 10 MG tablet Take by mouth.    . losartan  (COZAAR ) 50 MG tablet Take by mouth.    . traZODone  (DESYREL ) 50 MG tablet TAKE 2 TABLETS (100 MG TOTAL) BY MOUTH NIGHTLY. 60 tablet 3  . neomycin-polymyxin-dexAMETHasone  (MAXITROL) ophthalmic suspension One drop right eye 3 times a day for 1 week (Patient not taking: Reported on 02/07/2021) 5 mL 0   No facility-administered encounter medications on file as of 02/07/2021.     ALLERGIES: Patient has no known allergies.   PMHx:  Past Medical History:  Diagnosis Date  . Diverticulosis 02/05/2016  . Hyperlipidemia   . Hypertension   . Tremor       PSHx: Past Surgical History:  Procedure Laterality Date  . COLONOSCOPY  02/05/2016   Diverticulosis/Hyperplastic epithelial changes/FHx colon polyps-Brother/Repeat 43yrs/MUS  . LENS EYE SURGERY     Lasik O[facility]      FAMILY HISTORY: Family History  Problem Relation Age of Onset  . Diabetes Mother   . Heart disease Mother   . High blood pressure (Hypertension) Mother   . Glaucoma Mother   . Cancer Father   . Glaucoma Father   . Colon polyps Brother      Social History: Social History   Socioeconomic History  . Marital status: Married  Tobacco Use  . Smoking status: Never Smoker  . Smokeless tobacco: Never Used  Substance and Sexual Activity  . Alcohol use: No    Alcohol/week: 0.0 standard drinks  . Drug use: No     REVIEW OF SYSTEMS:   10 systems reviewed, unremarkable other than what is written above, with the exception of tremors PHYSICAL EXAM:  Vitals:   02/07/21 1103  BP: 136/88  Pulse: 52   Body mass index is 23.46 kg/m. Weight: 78.5 kg (173 lb)   General Appearance:    Alert, cooperative, no distress, appears stated age Head:     Atraumatic, normocephalic Eyes:   Anciteric, conjunctiva pink. Mouth: no oral ulcers Lungs:     Respirations eupneic.  Clear to auscultation bilaterally  Heart:    Regular rate and rhythm, S1 and S2 normal, no murmur, rub   or gallop Abdomen:     Flat, bowel sounds  x 4.Soft, non-tender/nondistended. No guarding, rigidity, rebound tenderness or other peritoneal signs Extremities:   No cyanosis or edema, moves all extremities well. Strength 5/5. Skin:   Skin color, texture, turgor normal, no rashes or lesions Neuro: alert, oriented x 3, cranial nerves II-XII intact Psych: pleasant, calm, cooperative, Logical thought and good insight.    REVIEW OF DATA: I have reviewed the following data today:  Prior notes, labs, endoscopy reports    ASSESSMENT AND PLAN:   1. Family history of colon polyps. Schedule screening  colonoscopy.  Procedure information given: indications, benefits, risks- including, but not limited to bleeding, infection, perforation, difficulty with sedation, were discussed with the patient, and they are agreeable to the procedure 2. Follow up as needed after procedure, sooner if problems

## 2021-03-20 ENCOUNTER — Encounter: Payer: Self-pay | Admitting: Family Medicine

## 2021-03-20 ENCOUNTER — Other Ambulatory Visit: Payer: Self-pay

## 2021-03-20 ENCOUNTER — Ambulatory Visit (INDEPENDENT_AMBULATORY_CARE_PROVIDER_SITE_OTHER): Payer: Medicare HMO | Admitting: Family Medicine

## 2021-03-20 VITALS — BP 138/84 | HR 58 | Ht 72.0 in | Wt 175.4 lb

## 2021-03-20 DIAGNOSIS — M7552 Bursitis of left shoulder: Secondary | ICD-10-CM

## 2021-03-20 DIAGNOSIS — M7551 Bursitis of right shoulder: Secondary | ICD-10-CM | POA: Diagnosis not present

## 2021-03-20 DIAGNOSIS — Z23 Encounter for immunization: Secondary | ICD-10-CM | POA: Diagnosis not present

## 2021-03-20 MED ORDER — BACLOFEN 10 MG PO TABS: 5.0000 mg | ORAL_TABLET | Freq: Three times a day (TID) | ORAL | 3 refills | Status: DC | PRN

## 2021-03-20 NOTE — Patient Instructions (Addendum)
Thank you for coming to the office today.  [name]  If not quite resolving the problem, we can refer to Orthopedics or Sports Medicine ([facility]igmund [facility])   Start taking Baclofen (Lioresal) 10mg  (muscle relaxant) - start with half (cut) to one whole pill at night as needed for next 1-3 nights (may make you drowsy, caution with driving) see how it affects you, then if tolerated increase to one pill 2 to 3 times a day or (every 8 hours as needed)    Please schedule a Follow-up Appointment to: Return in about 4 weeks (around 04/17/2021), or if symptoms worsen or fail to improve.  If you have any other questions or concerns, please feel free to call the office or send a message through [name]. You may also schedule an earlier appointment if necessary.  Additionally, you may be receiving a survey about your experience at our office within a few days to 1 week by e-mail or mail. We value your feedback.  [name], DO [place]

## 2021-03-20 NOTE — Progress Notes (Signed)
Subjective:    [name], male    DOB: 01-14-1951, 70 y.o.   MRN: [id number]  Jesse Figueroa is a 70 y.o. male presenting on 03/20/2021 for Shoulder Pain   HPI  Bilateral Shoulder Pain, chronic  - Last visit with me for this topic 09/04/20, for same problem shoulder pain, treated with X-ray L Shoulder negative see below, and baclofen, tylenol, voltaren, see prior notes for background information. - Interval update with mixed results, temporary improvement but not resolving the problem. - Today patient reports still has pain in shoulder R>L now, he is R handed, has pinching pain if lift shoulder up or with certain movements, at rest, not having pain. When waking up in morning he feels weaker with the shoulder - Rarely waking up at night due to shoulder pain - Seems to have resolved paresthesia on R upper extremity now. Previously on baclofen PRN needs refill   Depression screen [facility] 2/9 03/20/2021 10/02/2020 11/25/2019  Decreased Interest 0 0 0  Down, Depressed, Hopeless 0 0 0  PHQ - 2 Score 0 0 0  Altered sleeping 2 - -  Tired, decreased energy 0 - -  Change in appetite 0 - -  Feeling bad or failure about yourself  0 - -  Trouble concentrating 0 - -  Moving slowly or fidgety/restless 0 - -  Suicidal thoughts 0 - -  PHQ-9 Score 2 - -  Difficult doing work/chores Not difficult at all - -    Social History   Tobacco Use   Smoking status: Never   Smokeless tobacco: Never  Vaping Use   Vaping Use: Never used  Substance Use Topics   Alcohol use: No   Drug use: No    Review of Systems Per HPI unless specifically indicated above     Objective:    BP 138/84   Pulse (!) 58   Ht 6' (1.829 m)   Wt 175 lb 6.4 oz (79.6 kg)   SpO2 100%   BMI 23.79 kg/m   Wt Readings from Last 3 Encounters:  03/20/21 175 lb 6.4 oz (79.6 kg)  11/15/20 171 lb 9.6 oz (77.8 kg)  10/02/20 168 lb (76.2 kg)    Physical Exam Vitals and nursing note reviewed.  Constitutional:       General: He is not in acute distress.    Appearance: Normal appearance. He is well-developed. He is not diaphoretic.     Comments: Well-appearing, comfortable, cooperative  HENT:     Head: Normocephalic and atraumatic.  Eyes:     General:        Right eye: No discharge.        Left eye: No discharge.     Conjunctiva/sclera: Conjunctivae normal.  Cardiovascular:     Rate and Rhythm: Normal rate.  Pulmonary:     Effort: Pulmonary effort is normal.  Musculoskeletal:     Comments: R shoulder with full range of motion, some pain reproduced impingement with flexion abduction. Positive impingement test.  L Shoulder has full range of motion  Skin:    General: Skin is warm and dry.     Findings: No erythema or rash.  Neurological:     Mental Status: He is alert and oriented to person, place, and time.  Psychiatric:        Mood and Affect: Mood normal.        Behavior: Behavior normal.        Thought Content: Thought content normal.  Comments: Well groomed, good eye contact, normal speech and thoughts    I have personally reviewed the radiology report from 09/04/20 on Left Shoulder X-ray.  DG Shoulder LeftPerformed 09/04/2020 Final result  Study Result CLINICAL DATA: Left shoulder pain without known injury.  EXAM: LEFT SHOULDER - 2+ VIEW  COMPARISON: None.  FINDINGS: There is no evidence of fracture or dislocation. There is no evidence of arthropathy or other focal bone abnormality. Soft tissues are unremarkable.  IMPRESSION: Negative.   Electronically Signed By: Marijo Conception M.D. On: 09/05/2020 09:26     Results for orders placed or performed in visit on 01/11/21  HM DIABETES EYE EXAM  Result Value Ref Range   HM Diabetic Eye Exam No Retinopathy No Retinopathy      Assessment & Plan:   Problem List Items Addressed This Visit   None Visit Diagnoses     Bilateral shoulder bursitis    -  Primary   Relevant Medications   baclofen (LIORESAL) 10 MG tablet    Needs flu shot       Relevant Orders   Flu Vaccine QUAD High Dose(Fluad)       Nicole [facility] PT Future Ortho vs Sports  Chronic bilateral shoulder pain impingement Likely rotator cuff bursitis with flare Now >1 + years, last seen 08/2020 Additionally history of neck  And shoulder muscle spasm Has had some repetitive overhead activities to provoke Limited symptoms at rest and daily activity, usually provoked activities only   No evidence of radicular symptoms or neurological deficits or weakness. Has had some paresthesia in past, now improved No injury   L Shoulder X-ray no arthropathy as above  Handwritten referral to [name] today   - Re order Baclofen 5-10mg  QHS PRN muscle relaxant, caution sedation Tylenol PRN, OTC Voltaren coupon given Heating pad / ice, consider muscle rub ROM stretching   - Follow up if not improving - we can refer to Orthpedics vs Sports medicine for next opinion, he preferred PT only at first.   Meds ordered this encounter  Medications   baclofen (LIORESAL) 10 MG tablet    Sig: Take 0.5-1 tablets (5-10 mg total) by mouth 3 (three) times daily as needed for muscle spasms.    Dispense:  30 each    Refill:  3     Follow up plan: Return in about 4 weeks (around 04/17/2021), or if symptoms worsen or fail to improve.   [name], [facility] 03/20/2021, 10:53 AM

## 2021-04-01 ENCOUNTER — Ambulatory Visit (INDEPENDENT_AMBULATORY_CARE_PROVIDER_SITE_OTHER): Payer: Medicare HMO

## 2021-04-01 ENCOUNTER — Telehealth: Payer: Medicare HMO | Admitting: General Practice

## 2021-04-01 DIAGNOSIS — I129 Hypertensive chronic kidney disease with stage 1 through stage 4 chronic kidney disease, or unspecified chronic kidney disease: Secondary | ICD-10-CM

## 2021-04-01 DIAGNOSIS — E7849 Other hyperlipidemia: Secondary | ICD-10-CM

## 2021-04-01 DIAGNOSIS — I10 Essential (primary) hypertension: Secondary | ICD-10-CM

## 2021-04-01 DIAGNOSIS — G8929 Other chronic pain: Secondary | ICD-10-CM

## 2021-04-01 DIAGNOSIS — M7551 Bursitis of right shoulder: Secondary | ICD-10-CM

## 2021-04-01 DIAGNOSIS — M7552 Bursitis of left shoulder: Secondary | ICD-10-CM

## 2021-04-01 DIAGNOSIS — M25512 Pain in left shoulder: Secondary | ICD-10-CM

## 2021-04-01 NOTE — Chronic Care Management (AMB) (Signed)
Chronic Care Management   CCM RN Visit Note  04/01/2021 Name: Jesse Figueroa MRN: [id number] DOB: [birth year]  Subjective: Jesse Figueroa is a 70 y.o. year old male who is a primary care patient of Jesse Hauser, DO. The care management team was consulted for assistance with disease management and care coordination needs.    Engaged with patient by telephone for follow up visit in response to provider referral for case management and/or care coordination services.   Consent to Services:  The patient was given information about Chronic Care Management services, agreed to services, and gave verbal consent prior to initiation of services.  Please see initial visit note for detailed documentation.   Patient agreed to services and verbal consent obtained.   Assessment: Review of patient past medical history, allergies, medications, health status, including review of consultants reports, laboratory and other test data, was performed as part of comprehensive evaluation and provision of chronic care management services.   SDOH (Social Determinants of Health) assessments and interventions performed:    CCM Care Plan  No Known Allergies  Outpatient Encounter Medications as of 04/01/2021  Medication Sig Note   aspirin 81 MG chewable tablet Chew by mouth.    atorvastatin (LIPITOR) 10 MG tablet Take 1 tablet (10 mg total) by mouth daily.    baclofen (LIORESAL) 10 MG tablet Take 0.5-1 tablets (5-10 mg total) by mouth 3 (three) times daily as needed for muscle spasms.    calcium carbonate (TUMS - DOSED IN MG ELEMENTAL CALCIUM) 500 MG chewable tablet Chew 1 tablet by mouth daily as needed for indigestion or heartburn. 04/20/2020: Reports using ~ once/week   diclofenac Sodium (VOLTAREN) 1 % GEL Apply 2 g topically 4 (four) times daily as needed (joint pain arthritis shoulder).    fluticasone (FLONASE) 50 MCG/ACT nasal spray USE 2 SPRAYS IN BOTH NOSTRILS DAILY. USE FOR 4 TO 6 WEEKS THEN STOP  AND USE SEASONALLY OR AS NEEDED.    losartan (COZAAR) 100 MG tablet Take 1 tablet (100 mg total) by mouth daily.    propranolol (INDERAL) 20 MG tablet Take 1 tablet (20 mg total) by mouth 2 (two) times daily.    No facility-administered encounter medications on file as of 04/01/2021.    Patient Active Problem List   Diagnosis Date Noted   Rash and nonspecific skin eruption 03/27/2020   Influenza vaccine needed 03/27/2020   Cyst of right kidney 04/18/2019   CKD (chronic kidney disease), stage II 09/21/2017   Arthritis of hand 05/01/2016   Bradycardia 12/04/2015   Pre-diabetes 10/15/2015   Hyperlipidemia 07/09/2015   Benign hypertension with CKD (chronic kidney disease), stage II 04/09/2015   Insomnia 10/03/2014   Essential tremor 10/03/2014    Conditions to be addressed/monitored:HTN, HLD, and Chronic pain in bilateral shoulders  Care Plan : RNCM: Hypertension (Adult)  Updates made by Vanita Ingles, RN since 04/01/2021 12:00 AM     Problem: RNCM: Hypertension (Hypertension)   Priority: Medium     Long-Range Goal: RNCM: Hypertension Monitored   Start Date: 09/10/2020  Expected End Date: 01/16/2022  This Visit's Progress: On track  Recent Progress: On track  Priority: Medium  Note:   Objective:  Last practice recorded BP readings:  BP Readings from Last 3 Encounters:  03/20/21 138/84  11/15/20 122/80  09/04/20 126/77    Most recent eGFR/CrCl: No results found for: EGFR  No components found for: CRCL Current Barriers:  Knowledge Deficits related to basic understanding of hypertension pathophysiology and  self care management Knowledge Deficits related to understanding of medications prescribed for management of hypertension Does not contact provider office for questions/concerns Case Manager Clinical Goal(s):  patient will verbalize understanding of plan for hypertension management patient will demonstrate improved adherence to prescribed treatment plan for hypertension  as evidenced by taking all medications as prescribed, monitoring and recording blood pressure as directed, adhering to low sodium/DASH diet patient will demonstrate improved health management independence as evidenced by checking blood pressure as directed and notifying PCP if SBP>160 or DBP > 90, taking all medications as prescribe, and adhering to a low sodium diet as discussed. patient will verbalize basic understanding of hypertension disease process and self health management plan as evidenced by compliance with medications, compliance with heart healthy diet, and working with the CCM team to manage health and well being.  Interventions:  Collaboration with Jesse Hauser, DO regarding development and update of comprehensive plan of care as evidenced by provider attestation and co-signature Inter-disciplinary care team collaboration (see longitudinal plan of care) Evaluation of current treatment plan related to hypertension self management and patient's adherence to plan as established by provider. 01-21-2021: The patient is compliant with the poc and denies any new concerns related to HTN at this time. Follow up with the pcp in June. No new concerns. Did go on 11-19-2020 to get 2nd booster shot for COVID- record updated.  Discussed flu vaccine and the patient states he received in the office. The patient has follow up consultation for colonoscopy on 01-28-2021. 04-01-2021: The patient saw the pcp on 03-20-2021. Blood pressure a little elevated but he has been having pain in his bilateral shoulders (see care plan for chronic pain). The patient states he is having some stomach upset and takes tums when he needs to. Is scheduled for a colonoscopy in November. He states he has had them before.  He states he had his flu vaccine in the office on 03-20-2021 and is going to [facility] tomorrow for booster for COVID. Denies any issues with his blood pressures.  Provided education to patient re: stroke prevention,  s/s of heart attack and stroke, DASH diet, complications of uncontrolled blood pressure. 01-28-2021: The patient states he is eating good and denies any acute findings. The patient states he is not resting as well as he would like to.  He says he has no problem going to sleep but if he wakes up it is hard for him to go back to sleep. Discussed sleep hygiene.  He does not mind the heat outside.  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 He also mows his yard. He stays pretty active.   States his conditions are stable at this time. 04-01-2021: Denies any issues with medications compliance and dietary restrictions. Review of heart healthy habits.  Reviewed medications with patient and discussed importance of compliance. 04-01-2021: Verbalized compliance with his medications regimen.  Discussed plans with patient for ongoing care management follow up and provided patient with direct contact information for care management team Advised patient, providing education and rationale, to monitor blood pressure daily and record, calling PCP for findings outside established parameters.  Self-Care Activities: - Self administers medications as prescribed Attends all scheduled provider appointments Calls provider office for new concerns, questions, or BP outside discussed parameters Checks BP and records as discussed Follows a low sodium diet/DASH diet Patient Goals: - check blood pressure weekly - choose a place to take my blood pressure (home, clinic  or office, retail store) - write blood pressure results in a log or diary - agree on reward when goals are met - agree to work together to make changes - ask questions to understand - learn about high blood pressure - blood pressure equipment and technique reviewed - blood pressure trends reviewed - depression screen reviewed - home or ambulatory blood pressure monitoring encouraged  Follow Up Plan: Telephone follow up appointment  with care management team member scheduled for: 06-06-2021 at 0900 am    Care Plan : RNCM: Chronic Pain (Adult)  Updates made by Vanita Ingles, RN since 04/01/2021 12:00 AM     Problem: RNCM: Pain Management Plan (Chronic Pain)   Priority: High     Long-Range Goal: RNCM: Pain Management Plan Developed   Start Date: 09/10/2020  Expected End Date: 01/16/2022  This Visit's Progress: On track  Recent Progress: On track  Priority: High  Note:   Current Barriers:  Knowledge Deficits related to managing acute/chronic pain Non-adherence to scheduled provider appointments Non-adherence to prescribed medication regimen Difficulty obtaining medications Chronic Disease Management support and education needs related to chronic pain Unable to independently manage chronic pain and discomfort effectively  Does not contact provider office for questions/concerns Nurse Case Manager Clinical Goal(s):  patient will verbalize understanding of plan for managing pain patient will attend all scheduled medical appointments: patient to schedule appointment for 2 weeks patient will demonstrate use of different relaxation  skills and/or diversional activities to assist with pain reduction (distraction, imagery, relaxation, massage, acupressure, TENS, heat, and cold application patient will report pain at a level less than 3 to 4 on a 10-10 rating scale patient will use pharmacological and nonpharmacological pain relief strategies patient will verbalize acceptable level of pain relief and ability to engage in desired activities patient will engage in desired activities without an increase in pain level Interventions:  Collaboration with Parks Ranger Devonne Doughty, DO regarding development and update of comprehensive plan of care as evidenced by provider attestation and co-signature Inter-disciplinary care team collaboration (see longitudinal plan of care) - careful application of heat or ice encouraged-  10-29-2020: The patient is using heat application without difficulty. The patient states the heat does help with his pain at times. Review of 20 minutes on and then 20 minutes off.  - deep breathing, relaxation and mindfulness use promoted - effectiveness of pharmacologic therapy monitored. 01-21-2021: The patient states that the gel is not very effective in helping with his pain. He has some to use. 04-01-2021: The patient states the baclofen 10 mg TID prn is effective with helping with his pain and he takes as needed.  - medication-induced side effects managed - misuse of pain medication assessed- 04-01-2021: No issues noted.  - motivation and barriers to change assessed and addressed - mutually acceptable comfort goal set. 04-01-2021: the patient rates his pain level at a 3 today on a scale of 0-10. States he is feeling much better since seeing the pcp on 03-20-2021.  - pain assessed. 01-21-2021: The patient denies any pain at this time. States most of the time his pain is worse in the am when he first gets up. Will continue to monitor. The patient states he has not had pain in several days. He monitors closely. He denies any bad flare ups with his pain. States it comes and goes. He has not been limited in the activity he does. Will continue to monitor for changes in pain. 04-01-2021: Rates pain at a 3 today on  a scale of 0-10. States that he takes baclofen when needed. He also has an order for PT with [facility] but he has not called to set it up yet. Denies any acute findings today. Knows to call the pcp for changes in level or intensity of pain and discomfort.  - pain treatment goals reviewed. 01-21-2021: The patient says his pain is about the same, not worse. 04-01-2021: The patient states his pain level is better today and he feel better.  - premedication prior to activity encouraged Evaluation of current treatment plan related to pain management to pain in bilateral shoulders with left rotator cuff  involvement  and patient's adherence to plan as established by provider. Advised patient to call the office for changes in level or intensity of pain. Saw pcp on 03-20-2021 and is following the plan of care Provided education to patient re: alternative pain relieve measures, reporting changes in pain, working with the pcp and CCM team to effectively manage pain and discomfort.  Reviewed medications with patient and discussed compliance.  The patient states he can not see any difference in his pain level with medications use. The stimulation device is helpful. The patient will follow up with the pcp in 2 weeks for reevaluation. 04-01-2021: The patient is compliant with medications. No issues noted.  Discussed plans with patient for ongoing care management follow up and provided patient with direct contact information for care management team Allow patient to maintain a diary of pain ratings, timing, precipitating events, medications, treatments, and what works best to relieve pain,  Refer to support groups and self-help groups Educate patient about the use of pharmacological interventions for pain management- antianxiety, antidepressants, NSAIDS, opioid analgesics,  Explain the importance of lifestyle modifications to effective pain management  Patient Goals/Self Care Activities:  - mutually acceptable comfort goal set - pain assessed - pain management plan developed - pain treatment goals reviewed - patient response to treatment assessed - sharing of pain management plan with teachers and other caregivers encouraged Self-administers medications as prescribed Attends all scheduled provider appointments Calls pharmacy for medication refills Calls provider office for new concerns or questions Follow Up Plan: Telephone follow up appointment with care management team member scheduled for: 06-06-2021 at 0900        Care Plan : RNCM: HLD Management  Updates made by Vanita Ingles, RN since  04/01/2021 12:00 AM     Problem: Health Promotion or Disease Self-Management (General Plan of Care)      Long-Range Goal: RNCM: Management of HLD   Start Date: 09/10/2020  Expected End Date: 01/16/2022  This Visit's Progress: On track  Recent Progress: On track  Priority: Medium  Note:   Current Barriers:  Poorly controlled hyperlipidemia, complicated by chronic pain Current antihyperlipidemic regimen: Lipitor 10 mg Most recent lipid panel:  Lab Results  Component Value Date   CHOL 144 11/16/2020   CHOL 135 11/16/2019   CHOL 195 11/23/2018   Lab Results  Component Value Date   HDL 48 11/16/2020   HDL 49 11/16/2019   HDL 46 11/23/2018   Lab Results  Component Value Date   LDLCALC 76 11/16/2020   LDLCALC 72 11/16/2019   LDLCALC 130 (H) 11/23/2018   Lab Results  Component Value Date   TRIG 110 11/16/2020   TRIG 59 11/16/2019   TRIG 86 11/23/2018   Lab Results  Component Value Date   CHOLHDL 3.0 11/16/2020   CHOLHDL 2.8 11/16/2019   CHOLHDL 4.2 11/23/2018   No  results found for: LDLDIRECT  ASCVD risk enhancing conditions: age >8, HTN, CKD Unable to independently manage HLD Does not contact provider office for questions/concerns RN Care Manager Clinical Goal(s):  patient will work with RN Care Manager, providers, and care team towards execution of optimized self-health management plan patient will verbalize understanding of plan for effective management of HLD patient will work with RNCM, pcp, and CCM team to address needs related to effective management of HLD Interventions: Collaboration with Parks Ranger, Devonne Doughty, DO regarding development and update of comprehensive plan of care as evidenced by provider attestation and co-signature Inter-disciplinary care team collaboration (see longitudinal plan of care) Medication review performed; medication list updated in electronic medical record. 04-01-2021: The patient is compliant with medications.    Inter-disciplinary care team collaboration (see longitudinal plan of care) Referred to pharmacy team for assistance with HLD medication management Evaluation of current treatment plan related to HLD and patient's adherence to plan as established by provider. 04-01-2021: The patient denies any new concerns with HLD management. The patient states that he is eating well and following Heart healthy diet. Will continue to monitor. Labs at recent physical were WNL. Praised the patient for good results.  Advised patient to call the office for changes or questions Provided education to patient re: heart healthy diet, activity and effective management of HLD Discussed plans with patient for ongoing care management follow up and provided patient with direct contact information for care management team Patient Goals/Self-Care Activities: - call for medicine refill 2 or 3 days before it runs out - call if I am sick and can't take my medicine - keep a list of all the medicines I take; vitamins and herbals too - learn to read medicine labels - use a pillbox to sort medicine - use an alarm clock or phone to remind me to take my medicine - drink 6 to 8 glasses of water each day - eat 3 to 5 servings of fruits and vegetables each day - eat 5 or 6 small meals each day - fill half the plate with nonstarchy vegetables - limit fast food meals to no more than 1 per week - manage portion size - prepare main meal at home 3 to 5 days each week - read food labels for fat, fiber, carbohydrates and portion size - be open to making changes - I can manage, know and watch for signs of a heart attack - if I have chest pain, call for help - learn about small changes that will make a big difference - learn my personal risk factors  - advance care planning facilitated - barriers to meeting goals identified - change-talk evoked - choices provided - collaboration with team encouraged - decision-making supported -  difficulty of making life-long changes acknowledged - health risks reviewed - problem-solving facilitated - questions answered - readiness for change evaluated - reassurance provided - self-reflection promoted - self-reliance encouraged  Follow Up Plan: Telephone follow up appointment with care management team member scheduled for: 06-06-2021 at 0900 am       Plan:Telephone follow up appointment with care management team member scheduled for:  06-06-2021 at 0900 am  Noreene Larsson RN, MSN, [place] Mobile: [phone]

## 2021-04-01 NOTE — Patient Instructions (Signed)
Visit Information  PATIENT GOALS:  Goals Addressed             This Visit's Progress    RNCM: Manage Chronic Pain       Timeframe:  Long-Range Goal Priority:  High Start Date:   09-10-2020                          Expected End Date:      12-13-2021               Follow Up Date 06-06-2021   - call for medicine refill 2 or 3 days before it runs out - develop a personal pain management plan - keep track of prescription refills - plan exercise or activity when pain is best controlled - prioritize tasks for the day - track times pain is worst and when it is best - track what makes the pain worse and what makes it better - use ice or heat for pain relief - work slower and less intense when having pain    Why is this important?   Day-to-day life can be hard when you have chronic pain.  Pain medicine is just one piece of the treatment puzzle.  You can try these action steps to help you manage your pain.    Notes: Pain bilateral shoulders with left rotator cuff pain. 10-29-2020: The patient states his pain is about the same. He states that it is not worse but symptoms are worse in the am. Heat makes it feel better. 01-21-2021: The patient states that his pain is actually better. He has not had pain in the last several days. The patient says it comes and goes. He feels it is stable at this time. Will continue to monitor. 04-01-2021: Seen in the office on 03-20-2021 and evaluated for pain in bilateral shoulders. The patient states his pain level is a 3 today on a scale of 0-10. Has baclofen 10 mg tid prn as needed. Has a script for PT with [facility] but has not established with them at this time. Has information to call. The patient denies any acute findings at this time. Will continue to monitor for changes.         Patient verbalizes understanding of instructions provided today and agrees to view in [place].   Telephone follow up appointment with care management team member scheduled  for: 06-06-2021 at 0900 am  Noreene Larsson RN, MSN, [place] Mobile: [phone]

## 2021-04-04 DIAGNOSIS — T1502XA Foreign body in cornea, left eye, initial encounter: Secondary | ICD-10-CM | POA: Diagnosis not present

## 2021-04-15 DIAGNOSIS — I129 Hypertensive chronic kidney disease with stage 1 through stage 4 chronic kidney disease, or unspecified chronic kidney disease: Secondary | ICD-10-CM

## 2021-04-15 DIAGNOSIS — E7849 Other hyperlipidemia: Secondary | ICD-10-CM

## 2021-04-15 DIAGNOSIS — I10 Essential (primary) hypertension: Secondary | ICD-10-CM | POA: Diagnosis not present

## 2021-04-15 DIAGNOSIS — N182 Chronic kidney disease, stage 2 (mild): Secondary | ICD-10-CM

## 2021-05-04 IMAGING — MR MR ABDOMEN WO/W CM
18 series · 48 of 48 positions shown · IV contrast (gadavist)
Comparison: Abdominal MRI 04/18/2019.

CLINICAL DATA: 69-year-old male with history of renal cyst.
Follow-up study.

EXAM:
MRI ABDOMEN WITHOUT AND WITH CONTRAST
TECHNIQUE: Multiplanar multisequence MR imaging of the abdomen was performed
both before and after the administration of intravenous contrast.
CONTRAST:  7.5mL GADAVIST GADOBUTROL 1 MMOL/ML IV SOLN

[Series 2: cor haste · coronal · 6.0mm · 1.19mm/px · 2 of 32 slices shown]
[im 1/32]
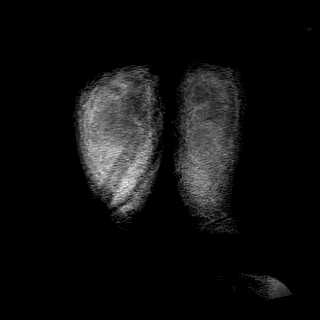
[im 32/32]
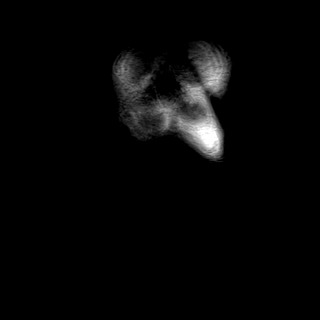

[Series 4: T2 fat-sat · axial · 6.0mm · 1.19mm/px · z∈[-144,+79]mm · 2 of 32 slices shown]
[im 1/32]
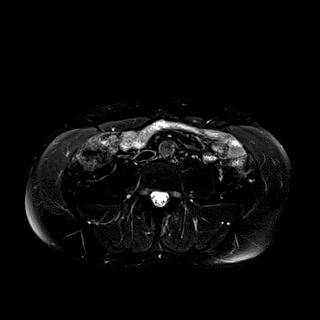
[im 32/32]
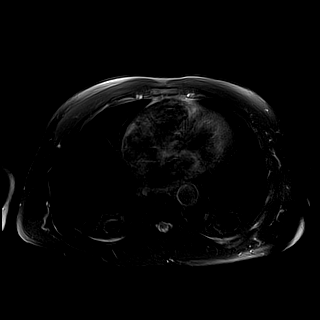

[Series 6: DWI · axial · 6.0mm · 1.42mm/px · z∈[-144,+79]mm · 5 of 96 slices shown (1 of 2)]
[im 1/96]
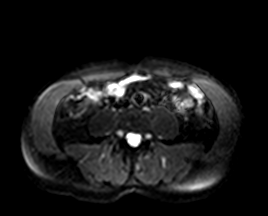
[im 24/96]
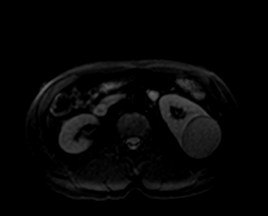
[im 48/96]
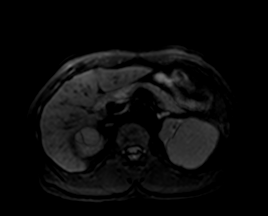
[im 72/96]
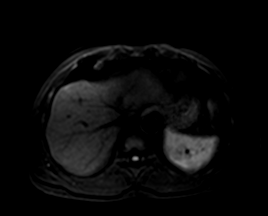
[im 96/96]
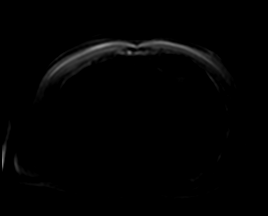

[Series 7: DWI · axial · 6.0mm · 1.42mm/px · 1 of 32 slices shown (2 of 2)]
[im 1/32]
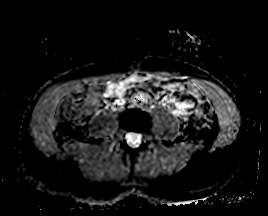

[Series 8: ax in & · axial · 3.0mm · 1.19mm/px · z∈[-139,+74]mm · 3 of 72 slices shown (1 of 2)]
[im 1/72]
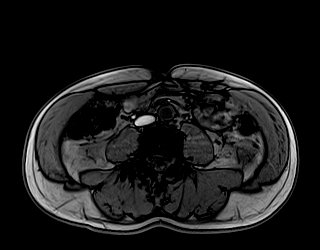
[im 36/72]
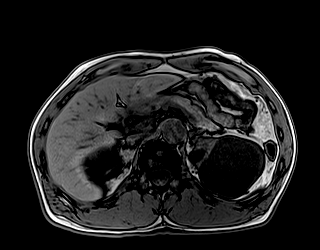
[im 72/72]
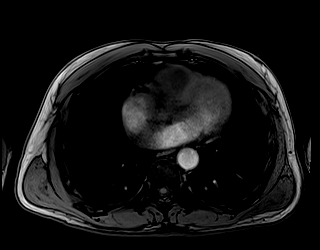

[Series 8: ax in & · axial · 3.0mm · 1.19mm/px · z∈[-139,+74]mm · 3 of 72 slices shown (2 of 2)]
[im 1/72]
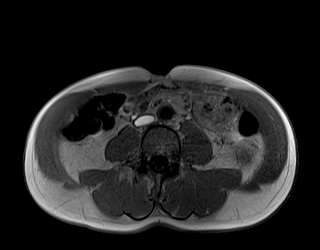
[im 36/72]
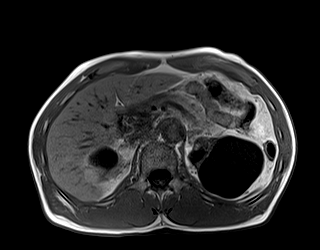
[im 72/72]
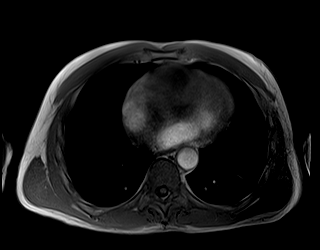

[Series 9: bSSFP · axial · 6.0mm · 0.74mm/px · 1 of 32 slices shown]
[im 1/32]
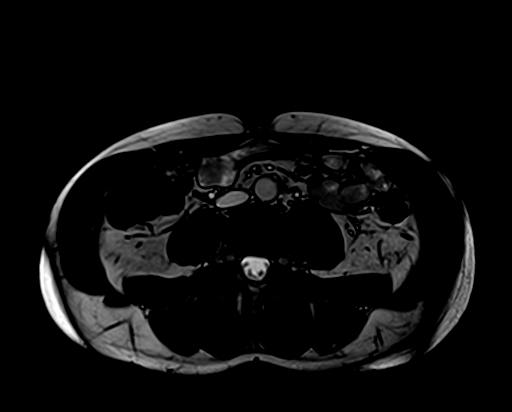

[Series 10: T1 dynamic · axial · non-contrast · 3.0mm · 1.19mm/px · z∈[-143,+70]mm · 3 of 72 slices shown (1 of 9)]
[im 1/72]
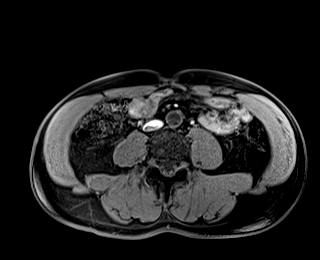
[im 36/72]
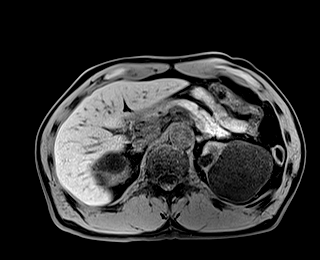
[im 72/72]
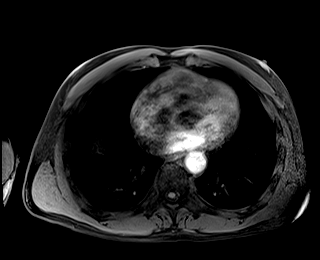

[Series 11: T1 dynamic · axial · 3.0mm · 1.19mm/px · z∈[-143,+70]mm · 3 of 72 slices shown (2 of 9)]
[im 1/72]
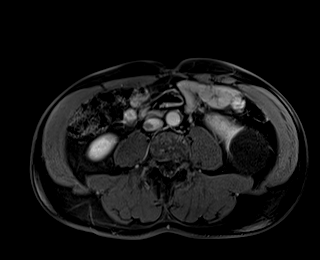
[im 36/72]
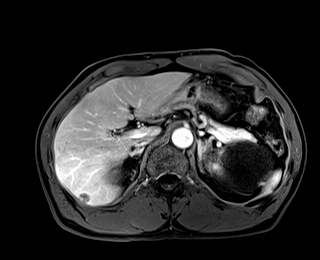
[im 72/72]
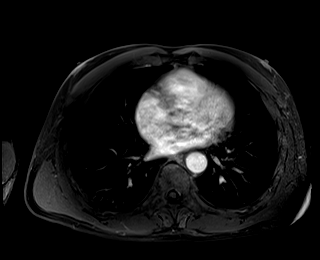

[Series 12: T1 dynamic · axial · 3.0mm · 1.19mm/px · z∈[-143,+70]mm · 3 of 72 slices shown (3 of 9)]
[im 1/72]
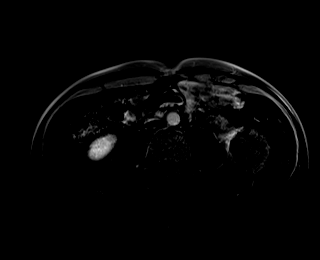
[im 36/72]
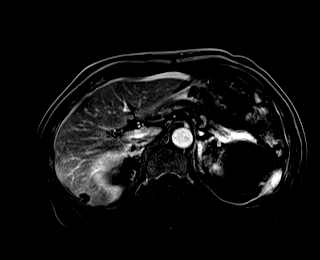
[im 72/72]
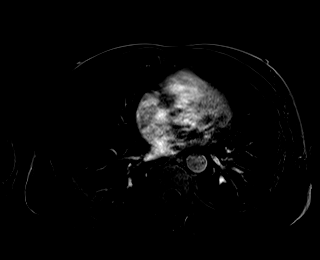

[Series 13: T1 dynamic · axial · 3.0mm · 1.19mm/px · z∈[-143,+70]mm · 3 of 72 slices shown (4 of 9)]
[im 1/72]
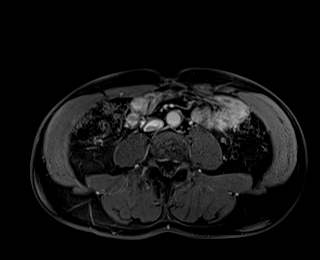
[im 36/72]
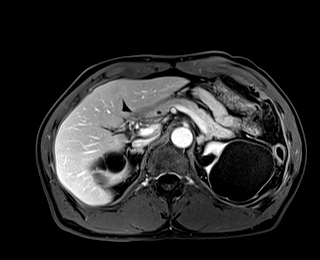
[im 72/72]
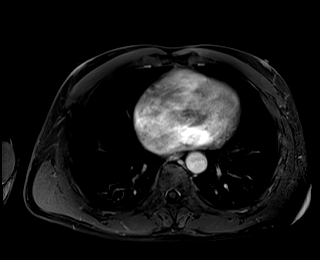

[Series 14: T1 dynamic · axial · 3.0mm · 1.19mm/px · z∈[-143,+70]mm · 3 of 72 slices shown (5 of 9)]
[im 1/72]
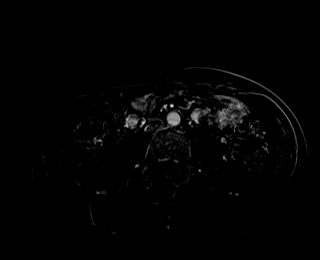
[im 36/72]
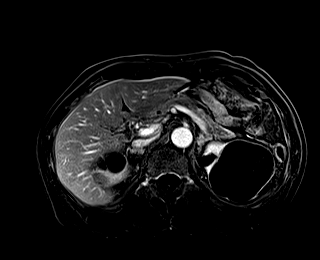
[im 72/72]
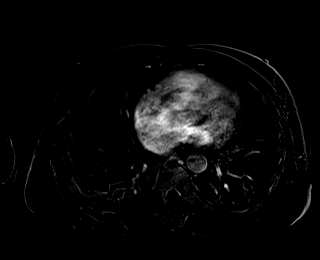

[Series 15: T1 dynamic · axial · 3.0mm · 1.19mm/px · z∈[-143,+70]mm · 3 of 72 slices shown (6 of 9)]
[im 1/72]
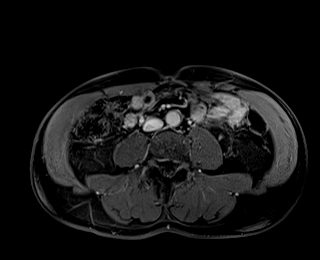
[im 36/72]
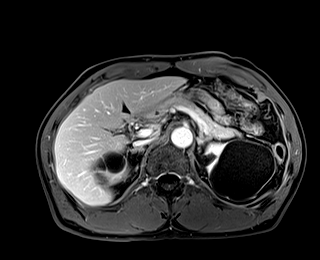
[im 72/72]
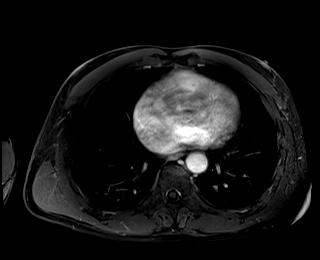

[Series 16: T1 dynamic · axial · 3.0mm · 1.19mm/px · z∈[-143,+70]mm · 3 of 72 slices shown (7 of 9)]
[im 1/72]
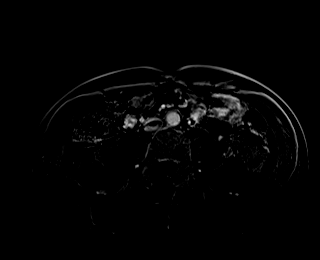
[im 36/72]
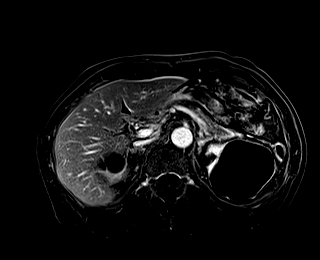
[im 72/72]
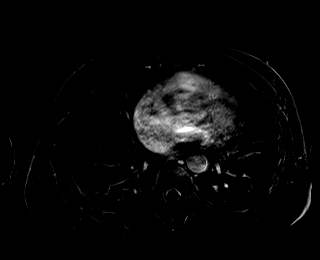

[Series 17: T1 dynamic post-contrast · coronal · 3.0mm · 1.31mm/px · 3 of 80 slices shown]
[im 1/80]
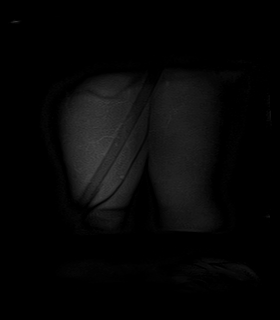
[im 40/80]
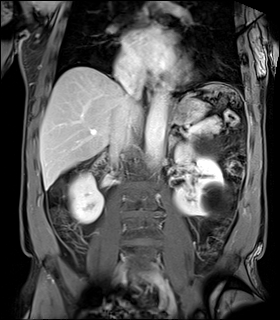
[im 80/80]
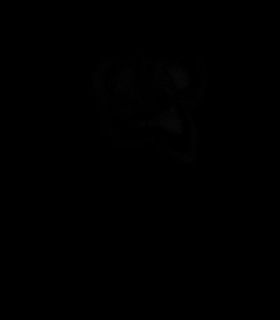

[Series 18: T1 dynamic · axial · 3.0mm · 1.19mm/px · z∈[-143,+70]mm · 3 of 72 slices shown (8 of 9)]
[im 1/72]
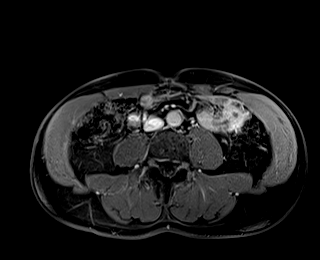
[im 36/72]
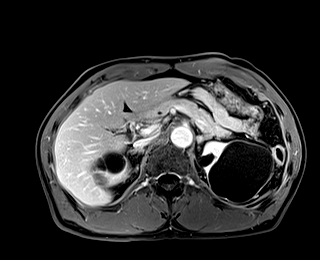
[im 72/72]
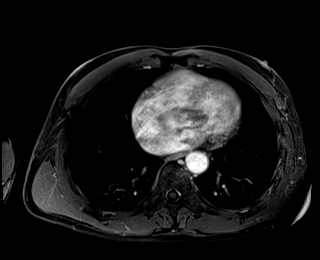

[Series 19: T1 dynamic · axial · 3.0mm · 1.19mm/px · z∈[-143,+70]mm · 3 of 72 slices shown (9 of 9)]
[im 1/72]
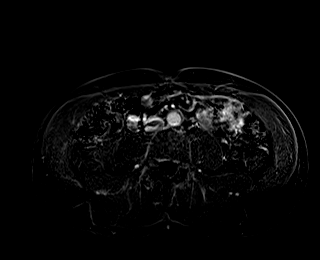
[im 36/72]
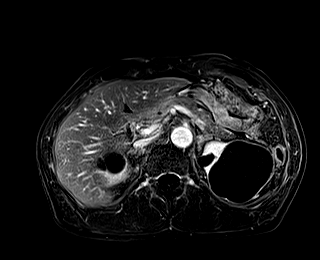
[im 72/72]
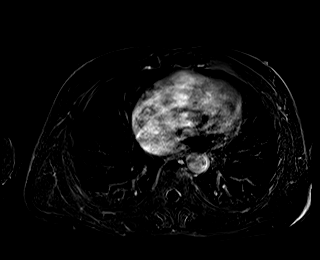

[Series 20: T2 · axial · 6.0mm · 1.19mm/px · 1 of 32 slices shown]
[im 1/32]
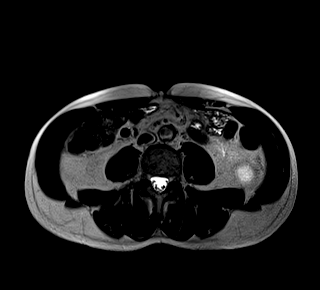

[48 of 48 positions shown; findings below may reference images not displayed]

FINDINGS: Lower chest: Unremarkable.

Hepatobiliary: 1.2 x 1.4 cm T1 hypointense, T2 hyperintense lesion
in segment 6 of the liver (axial image 15 of series 20) which
demonstrates some early peripheral nodular hyperenhancement with
progressive centripetal filling, diagnostic of a cavernous
hemangioma. No other suspicious hepatic lesions. No intra or
extrahepatic biliary ductal dilatation. Gallbladder is normal in
appearance.

Pancreas: No pancreatic mass. No pancreatic ductal dilatation. No
pancreatic or peripancreatic fluid collections or inflammatory
changes.

Spleen: Multiple T1 hypointense, T2 hyperintense lesions are noted
throughout the spleen which demonstrate enhancement on post
gadolinium images which persists on delayed phase imaging, similar
to that of blood pool, favored to represent multiple small flash
fill cavernous hemangiomas.

Adrenals/Urinary Tract: In the upper pole of the right kidney there
is again a 3.5 x 2.8 cm T1 hypointense, T2 hyperintense lesion which
has several thin internal septations which demonstrates some very
mild low level enhancement, but has no mural nodularity or solid
internal enhancement. Multiple other T1 hypointense, T2 hyperintense
simple appearing cysts with no internal enhancement are noted,
indicative of Bosniak class 2 cysts, largest of which is in the
upper pole the left kidney measuring 8.1 cm. No definite suspicious
renal lesions. No hydroureteronephrosis in the visualized portions
of the abdomen. Bilateral adrenal glands are normal in appearance.

Stomach/Bowel: Visualized portions are unremarkable.

Vascular/Lymphatic: No aneurysm identified in the visualized
abdominal vasculature. No lymphadenopathy noted in the abdomen.

Other: No significant volume of ascites noted in the visualized
portions of the peritoneal cavity.

Musculoskeletal: No aggressive appearing osseous lesions are noted
in the visualized portions of the skeleton.
IMPRESSION: 1. Stable appearance of Bosniak class 2F cyst in the upper pole of
the right kidney, strongly suggestive of a benign lesion. Repeat
abdominal MRI with and without IV gadolinium is recommended in 6
months to ensure continued stability. This recommendation follows
ACR consensus guidelines: Management of the Incidental Renal Mass on
CT: A White Paper of the ACR Incidental Findings Committee. [HOSPITAL] 8808;[DATE].
2. Small cavernous hemangioma in segment 6 of the liver incidentally
noted.
3. Multiple small lesions in the spleen again favored to represent
flash fill cavernous hemangiomas.

## 2021-05-08 ENCOUNTER — Encounter: Payer: Self-pay | Admitting: *Deleted

## 2021-05-13 ENCOUNTER — Ambulatory Visit: Payer: Medicare HMO | Admitting: Anesthesiology

## 2021-05-13 ENCOUNTER — Encounter
Admission: RE | Disposition: A | Discharge status: Home / Self Care | Payer: Self-pay | Source: Home / Self Care | Attending: Gastroenterology

## 2021-05-13 ENCOUNTER — Encounter: Payer: Self-pay | Admitting: *Deleted

## 2021-05-13 ENCOUNTER — Ambulatory Visit
Admission: RE | Admit: 2021-05-13 | Discharge: 2021-05-13 | Disposition: A | Discharge status: Home / Self Care | Payer: Medicare HMO | Attending: Gastroenterology | Admitting: Gastroenterology

## 2021-05-13 DIAGNOSIS — N182 Chronic kidney disease, stage 2 (mild): Secondary | ICD-10-CM | POA: Diagnosis not present

## 2021-05-13 DIAGNOSIS — E785 Hyperlipidemia, unspecified: Secondary | ICD-10-CM | POA: Diagnosis not present

## 2021-05-13 DIAGNOSIS — K640 First degree hemorrhoids: Secondary | ICD-10-CM | POA: Diagnosis not present

## 2021-05-13 DIAGNOSIS — I129 Hypertensive chronic kidney disease with stage 1 through stage 4 chronic kidney disease, or unspecified chronic kidney disease: Secondary | ICD-10-CM | POA: Diagnosis not present

## 2021-05-13 DIAGNOSIS — M1990 Unspecified osteoarthritis, unspecified site: Secondary | ICD-10-CM | POA: Insufficient documentation

## 2021-05-13 DIAGNOSIS — K635 Polyp of colon: Secondary | ICD-10-CM | POA: Diagnosis not present

## 2021-05-13 DIAGNOSIS — K649 Unspecified hemorrhoids: Secondary | ICD-10-CM | POA: Diagnosis not present

## 2021-05-13 DIAGNOSIS — Z8371 Family history of colonic polyps: Secondary | ICD-10-CM | POA: Insufficient documentation

## 2021-05-13 DIAGNOSIS — R7303 Prediabetes: Secondary | ICD-10-CM | POA: Diagnosis not present

## 2021-05-13 DIAGNOSIS — Z1211 Encounter for screening for malignant neoplasm of colon: Secondary | ICD-10-CM | POA: Diagnosis not present

## 2021-05-13 DIAGNOSIS — D122 Benign neoplasm of ascending colon: Secondary | ICD-10-CM | POA: Diagnosis not present

## 2021-05-13 HISTORY — PX: COLONOSCOPY: SHX5424

## 2021-05-13 SURGERY — COLONOSCOPY
Anesthesia: General

## 2021-05-13 MED ORDER — GLYCOPYRROLATE 0.2 MG/ML IJ SOLN
INTRAMUSCULAR | Status: AC
  Filled 2021-05-13: qty 1

## 2021-05-13 MED ORDER — LIDOCAINE 2% (20 MG/ML) 5 ML SYRINGE
INTRAMUSCULAR | Status: DC | PRN
  Administered 2021-05-13: 20 mg via INTRAVENOUS

## 2021-05-13 MED ORDER — PROPOFOL 500 MG/50ML IV EMUL
INTRAVENOUS | Status: AC
  Filled 2021-05-13: qty 150

## 2021-05-13 MED ORDER — SODIUM CHLORIDE 0.9 % IV SOLN: INTRAVENOUS | Status: DC

## 2021-05-13 MED ORDER — LIDOCAINE HCL (PF) 2 % IJ SOLN
INTRAMUSCULAR | Status: AC
  Filled 2021-05-13: qty 5

## 2021-05-13 MED ORDER — PROPOFOL 10 MG/ML IV BOLUS
INTRAVENOUS | Status: DC | PRN
  Administered 2021-05-13: 30 mg via INTRAVENOUS
  Administered 2021-05-13: 70 mg via INTRAVENOUS

## 2021-05-13 MED ORDER — PROPOFOL 500 MG/50ML IV EMUL
INTRAVENOUS | Status: DC | PRN
  Administered 2021-05-13: 120 ug/kg/min via INTRAVENOUS

## 2021-05-13 NOTE — Op Note (Signed)
[facility] Patient Name: Jesse Figueroa Procedure Date: 05/13/2021 7:01 AM MRN: [id number] Account #: [id number] Date of Birth: Dec 27, 1950 Admit Type: Outpatient Age: 70 Room: [facility] ENDO ROOM 3 Gender: Male Note Status: Finalized Instrument Name: Jasper Riling [id number] Procedure:             Colonoscopy Indications:           Colon cancer screening in patient at increased risk:                         Family history of 1st-degree relative with colon polyps Providers:             Andrey Farmer MD, MD Referring MD:          Olin Hauser (Referring MD) Medicines:             Monitored Anesthesia Care Complications:         No immediate complications. Estimated blood loss:                         Minimal. Procedure:             Pre-Anesthesia Assessment:                        - Prior to the procedure, a History and Physical was                         performed, and patient medications and allergies were                         reviewed. The patient is competent. The risks and                         benefits of the procedure and the sedation options and                         risks were discussed with the patient. All questions                         were answered and informed consent was obtained.                         Patient identification and proposed procedure were                         verified by the physician, the nurse, the anesthetist                         and the technician in the endoscopy suite. Mental                         Status Examination: alert and oriented. Airway                         Examination: normal oropharyngeal airway and neck                         mobility. Respiratory Examination: clear to  auscultation. CV Examination: normal. Prophylactic                         Antibiotics: The patient does not require prophylactic                         antibiotics. Prior  Anticoagulants: The patient has                         taken no previous anticoagulant or antiplatelet                         agents. ASA Grade Assessment: II - A patient with mild                         systemic disease. After reviewing the risks and                         benefits, the patient was deemed in satisfactory                         condition to undergo the procedure. The anesthesia                         plan was to use monitored anesthesia care (MAC).                         Immediately prior to administration of medications,                         the patient was re-assessed for adequacy to receive                         sedatives. The heart rate, respiratory rate, oxygen                         saturations, blood pressure, adequacy of pulmonary                         ventilation, and response to care were monitored                         throughout the procedure. The physical status of the                         patient was re-assessed after the procedure.                        After obtaining informed consent, the colonoscope was                         passed under direct vision. Throughout the procedure,                         the patient's blood pressure, pulse, and oxygen                         saturations were monitored continuously. The  Colonoscope was introduced through the anus and                         advanced to the the cecum, identified by appendiceal                         orifice and ileocecal valve. The colonoscopy was                         performed without difficulty. The patient tolerated                         the procedure well. The quality of the bowel                         preparation was good. Findings:      The perianal and digital rectal examinations were normal.      A 3 mm polyp was found in the ascending colon. The polyp was sessile.       The polyp was removed with a cold snare. Resection and  retrieval were       complete. Estimated blood loss was minimal.      Internal hemorrhoids were found during retroflexion. The hemorrhoids       were Grade I (internal hemorrhoids that do not prolapse).      The exam was otherwise without abnormality on direct and retroflexion       views. Impression:            - One 3 mm polyp in the ascending colon, removed with                         a cold snare. Resected and retrieved.                        - Internal hemorrhoids.                        - The examination was otherwise normal on direct and                         retroflexion views. Recommendation:        - Discharge patient to home.                        - Resume previous diet.                        - Continue present medications.                        - Await pathology results.                        - Repeat colonoscopy is not recommended due to current                         age (46 years or older) for surveillance.                        - Return to referring physician as previously  scheduled. Procedure Code(s):     --- Professional ---                        [phone], Colonoscopy, flexible; with removal of                         tumor(s), polyp(s), or other lesion(s) by snare                         technique Diagnosis Code(s):     --- Professional ---                        Z83.71, Family history of colonic polyps                        K63.5, Polyp of colon                        K64.0, First degree hemorrhoids CPT copyright 2019 American Medical Association. All rights reserved. The codes documented in this report are preliminary and upon coder review may  be revised to meet current compliance requirements. Andrey Farmer MD, MD 05/13/2021 8:03:21 AM Number of Addenda: 0 Note Initiated On: 05/13/2021 7:01 AM Scope Withdrawal Time: 0 hours 8 minutes 52 seconds  Total Procedure Duration: 0 hours 11 minutes 53 seconds  Estimated Blood Loss:   Estimated blood loss was minimal.      [facility]

## 2021-05-13 NOTE — Interval H&P Note (Signed)
History and Physical Interval Note:  05/13/2021 7:40 AM  Jesse Figueroa  has presented today for surgery, with the diagnosis of (Z83.71) FAMILY HISTORY OF POLYPS IN THE COLON.  The various methods of treatment have been discussed with the patient and family. After consideration of risks, benefits and other options for treatment, the patient has consented to  Procedure(s): COLONOSCOPY (N/A) as a surgical intervention.  The patient's history has been reviewed, patient examined, no change in status, stable for surgery.  I have reviewed the patient's chart and labs.  Questions were answered to the patient's satisfaction.     Lesly Rubenstein  Ok to proceed with colonoscopy

## 2021-05-13 NOTE — Anesthesia Postprocedure Evaluation (Signed)
Anesthesia Post Note  Patient: Jesse Figueroa  Procedure(s) Performed: COLONOSCOPY  Patient location during evaluation: PACU Anesthesia Type: General Level of consciousness: awake and alert, oriented and patient cooperative Pain management: pain level controlled Vital Signs Assessment: post-procedure vital signs reviewed and stable Respiratory status: spontaneous breathing, nonlabored ventilation and respiratory function stable Cardiovascular status: blood pressure returned to baseline and stable Postop Assessment: adequate PO intake Anesthetic complications: no   No notable events documented.   Last Vitals:  Vitals:   05/13/21 0803 05/13/21 0823  BP: 109/79 (!) 128/92  Pulse:    Resp:    Temp: (!) 36.4 C   SpO2:      Last Pain:  Vitals:   05/13/21 0823  TempSrc:   PainSc: 0-No pain                 Darrin Nipper

## 2021-05-13 NOTE — Anesthesia Preprocedure Evaluation (Signed)
Anesthesia Evaluation  Patient identified by MRN, date of birth, ID band Patient awake    Reviewed: Allergy & Precautions, NPO status , Patient's Chart, lab work & pertinent test results  History of Anesthesia Complications Negative for: history of anesthetic complications  Airway Mallampati: IV   Neck ROM: Full    Dental   Missing several molars:   Pulmonary neg pulmonary ROS,    Pulmonary exam normal breath sounds clear to auscultation       Cardiovascular Exercise Tolerance: Good hypertension, Normal cardiovascular exam Rhythm:Regular Rate:Normal     Neuro/Psych Essential tremor    GI/Hepatic negative GI ROS,   Endo/Other  Prediabetes   Renal/GU Renal disease (stage II CKD)     Musculoskeletal  (+) Arthritis ,   Abdominal   Peds  Hematology negative hematology ROS (+)   Anesthesia Other Findings   Reproductive/Obstetrics                             Anesthesia Physical Anesthesia Plan  ASA: 2  Anesthesia Plan: General   Post-op Pain Management:    Induction: Intravenous  PONV Risk Score and Plan: 2 and Propofol infusion, TIVA and Treatment may vary due to age or medical condition  Airway Management Planned: Natural Airway  Additional Equipment:   Intra-op Plan:   Post-operative Plan:   Informed Consent: I have reviewed the patients History and Physical, chart, labs and discussed the procedure including the risks, benefits and alternatives for the proposed anesthesia with the patient or authorized representative who has indicated his/her understanding and acceptance.       Plan Discussed with: CRNA  Anesthesia Plan Comments: (LMA/GETA backup discussed.  Patient consented for risks of anesthesia including but not limited to:  - adverse reactions to medications - damage to eyes, teeth, lips or other oral mucosa - nerve damage due to positioning  - sore throat or  hoarseness - damage to heart, brain, nerves, lungs, other parts of body or loss of life  Informed patient about role of CRNA in peri- and intra-operative care.  Patient voiced understanding.)        Anesthesia Quick Evaluation

## 2021-05-13 NOTE — H&P (Signed)
Outpatient short stay form Pre-procedure 05/13/2021  Lesly Rubenstein, MD  Primary Physician: Olin Hauser, DO  Reason for visit:  Screening  History of present illness:   70 y/o gentleman with history of hypertension here for screening colonoscopy due to family history of polyps. No blood thinners. No abdominal surgeries. Patient has never had adenomatous polyps and last colonoscopy was about 5 years.    Current Facility-Administered Medications:    0.9 %  sodium chloride infusion, , Intravenous, Continuous, Heran Campau, Hilton Cork, MD  Medications Prior to Admission  Medication Sig Dispense Refill Last Dose   aspirin 81 MG chewable tablet Chew by mouth.   Past Week   atorvastatin (LIPITOR) 10 MG tablet Take 1 tablet (10 mg total) by mouth daily. 90 tablet 3 Past Week   calcium carbonate (TUMS - DOSED IN MG ELEMENTAL CALCIUM) 500 MG chewable tablet Chew 1 tablet by mouth daily as needed for indigestion or heartburn.   Past Week   diclofenac Sodium (VOLTAREN) 1 % GEL Apply 2 g topically 4 (four) times daily as needed (joint pain arthritis shoulder). 100 g 2 Past Week   losartan (COZAAR) 100 MG tablet Take 1 tablet (100 mg total) by mouth daily. (Patient taking differently: Take 50 mg by mouth daily.) 90 tablet 3 05/12/2021   propranolol (INDERAL) 20 MG tablet Take 1 tablet (20 mg total) by mouth 2 (two) times daily. 180 tablet 3 05/13/2021 at 0600   traZODone (DESYREL) 50 MG tablet Take 100 mg by mouth at bedtime.   Past Week   baclofen (LIORESAL) 10 MG tablet Take 0.5-1 tablets (5-10 mg total) by mouth 3 (three) times daily as needed for muscle spasms. (Patient not taking: Reported on 05/13/2021) 30 each 3 Not Taking   fluticasone (FLONASE) 50 MCG/ACT nasal spray USE 2 SPRAYS IN BOTH NOSTRILS DAILY. USE FOR 4 TO 6 WEEKS THEN STOP AND USE SEASONALLY OR AS NEEDED. 48 g 1  at prn     No Known Allergies   Past Medical History:  Diagnosis Date   Arthritis of hand    Benign  hypertension with CKD (chronic kidney disease), stage II    CKD (chronic kidney disease), stage II    Hyperlipidemia    Hypertension    Tremor     Review of systems:  Otherwise negative.    Physical Exam  Gen: Alert, oriented. Appears stated age.  HEENT: PERRLA. Lungs: No respiratory distress CV: RRR Abd: soft, benign, no masses Ext: No edema    Planned procedures: Proceed with colonoscopy. The patient understands the nature of the planned procedure, indications, risks, alternatives and potential complications including but not limited to bleeding, infection, perforation, damage to internal organs and possible oversedation/side effects from anesthesia. The patient agrees and gives consent to proceed.  Please refer to procedure notes for findings, recommendations and patient disposition/instructions.     Lesly Rubenstein, MD [facility]

## 2021-05-13 NOTE — Transfer of Care (Signed)
Immediate Anesthesia Transfer of Care Note  Patient: Jesse Figueroa  Procedure(s) Performed: COLONOSCOPY  Patient Location: Endoscopy Unit  Anesthesia Type:General  Level of Consciousness: drowsy  Airway & Oxygen Therapy: Patient Spontanous Breathing  Post-op Assessment: Report given to RN and Post -op Vital signs reviewed and stable  Post vital signs: Reviewed  Last Vitals:  Vitals Value Taken Time  BP 109/79 05/13/21 0803  Temp 36.4 C 05/13/21 0803  Pulse 56 05/13/21 0802  Resp 13 05/13/21 0802  SpO2 98 % 05/13/21 0802  Vitals shown include unvalidated device data.  Last Pain:  Vitals:   05/13/21 0700  TempSrc: Temporal         Complications: No notable events documented.

## 2021-05-14 ENCOUNTER — Encounter: Payer: Self-pay | Admitting: Gastroenterology

## 2021-05-15 LAB — SURGICAL PATHOLOGY

## 2021-06-06 ENCOUNTER — Ambulatory Visit (INDEPENDENT_AMBULATORY_CARE_PROVIDER_SITE_OTHER): Payer: Medicare HMO

## 2021-06-06 ENCOUNTER — Telehealth: Payer: Medicare HMO

## 2021-06-06 DIAGNOSIS — E7849 Other hyperlipidemia: Secondary | ICD-10-CM

## 2021-06-06 DIAGNOSIS — M7552 Bursitis of left shoulder: Secondary | ICD-10-CM

## 2021-06-06 DIAGNOSIS — M25511 Pain in right shoulder: Secondary | ICD-10-CM

## 2021-06-06 DIAGNOSIS — N182 Chronic kidney disease, stage 2 (mild): Secondary | ICD-10-CM

## 2021-06-06 DIAGNOSIS — I10 Essential (primary) hypertension: Secondary | ICD-10-CM

## 2021-06-06 DIAGNOSIS — I129 Hypertensive chronic kidney disease with stage 1 through stage 4 chronic kidney disease, or unspecified chronic kidney disease: Secondary | ICD-10-CM

## 2021-06-06 NOTE — Patient Instructions (Signed)
Visit Information  Thank you for taking time to visit with me today. Please don't hesitate to contact me if I can be of assistance to you before our next scheduled telephone appointment.  Following are the goals we discussed today:  RNCM Clinical Goal(s):  Patient will verbalize basic understanding of HTN, HLD, CKD Stage 2, and Chronic pain  disease process and self health management plan as evidenced by keeping appointments, following the plan of care, and working with the pcp and CCM team to effectively manage health and well being  take all medications exactly as prescribed and will call provider for medication related questions as evidenced by compliance with medications and calling the refill needs before running out of medications     attend all scheduled medical appointments: no upcoming with the pcp but states he will call if needed  as evidenced by keeping appointments and calling for schedule change needs        demonstrate improved and ongoing health management independence as evidenced by stable conditions, VS WNL, regular lab work and working with the CCM team to optimize health and well being        demonstrate ongoing self health care management ability for effective management of Chronic conditions  as evidenced by  working  through collaboration with Consulting civil engineer, provider, and care team.    Interventions: 1:1 collaboration with primary care provider regarding development and update of comprehensive plan of care as evidenced by provider attestation and co-signature Inter-disciplinary care team collaboration (see longitudinal plan of care) Evaluation of current treatment plan related to  self management and patient's adherence to plan as established by provider     Chronic Kidney Disease (Status: Goal on Track (progressing): YES.)  Long Term Goal  Last practice recorded BP readings:     BP Readings from Last 3 Encounters:  06/06/21 129/84  05/13/21 (!) 128/92  03/20/21  138/84  Most recent eGFR/CrCl: No results found for: EGFR  No components found for: CRCL   Assessed the patient     understanding of chronic kidney disease    Evaluation of current treatment plan related to chronic kidney disease self management and patient's adherence to plan as established by provider      Provided education to patient re: stroke prevention, s/s of heart attack and stroke    Reviewed prescribed diet heart healthy Reviewed medications with patient and discussed importance of compliance    Advised patient, providing education and rationale, to monitor blood pressure daily and record, calling PCP for findings outside established parameters    Discussed complications of poorly controlled blood pressure such as heart disease, stroke, circulatory complications, vision complications, kidney impairment, sexual dysfunction    Reviewed scheduled/upcoming provider appointments including: No upcoming appointments, knows to call the office for changes   Discussed plans with patient for ongoing care management follow up and provided patient with direct contact information for care management team    Screening for signs and symptoms of depression related to chronic disease state      Discussed the impact of chronic kidney disease on daily life and mental health and acknowledged and normalized feelings of disempowerment, fear, and frustration    Assessed social determinant of health barriers    Provided education on kidney disease progression    Engage patient in early, proactive and ongoing discussion about goals of care and what matters most to them    Support coping and stress management by recognizing current strategies and assist in  developing new strategies such as mindfulness, journaling, relaxation techniques, problem-solving        Hyperlipidemia:  (Status: Goal on Track (progressing): YES.) Long Term Goal       Lab Results  Component Value Date    CHOL 144 11/16/2020    HDL 48  11/16/2020    LDLCALC 76 11/16/2020    TRIG 110 11/16/2020    CHOLHDL 3.0 11/16/2020      Medication review performed; medication list updated in electronic medical record.  Provider established cholesterol goals reviewed; Counseled on importance of regular laboratory monitoring as prescribed; Provided HLD educational materials; Reviewed role and benefits of statin for ASCVD risk reduction; Discussed strategies to manage statin-induced myalgias; Reviewed importance of limiting foods high in cholesterol; Screening for signs and symptoms of depression related to chronic disease state;  Assessed social determinant of health barriers;    Hypertension: (Status: Goal on Track (progressing): YES.) Last practice recorded BP readings:     BP Readings from Last 3 Encounters:  06/06/21 129/84  05/13/21 (!) 128/92  03/20/21 138/84  Most recent eGFR/CrCl: No results found for: EGFR  No components found for: CRCL   Evaluation of current treatment plan related to hypertension self management and patient's adherence to plan as established by provider;   Provided education to patient re: stroke prevention, s/s of heart attack and stroke; Reviewed prescribed diet heart healthy diet Reviewed medications with patient and discussed importance of compliance;  Discussed plans with patient for ongoing care management follow up and provided patient with direct contact information for care management team; Advised patient, providing education and rationale, to monitor blood pressure daily and record, calling PCP for findings outside established parameters;  Provided education on prescribed diet heart healthy diet ;  Discussed complications of poorly controlled blood pressure such as heart disease, stroke, circulatory complications, vision complications, kidney impairment, sexual dysfunction;    Pain:  (Status: Goal on Track (progressing): YES.) Long Term Goal  Pain assessment performed. 06-06-2021: The  patient denies any pain this morning. States it comes and goes. It is more intense when he works a lot and does things where he is raising his arms and hands. States it varies daily but does not wake him from his sleep. Will continue to monitor for changes and needs.  Medications reviewed Reviewed provider established plan for pain management; Discussed importance of adherence to all scheduled medical appointments; Counseled on the importance of reporting any/all new or changed pain symptoms or management strategies to pain management provider; Advised patient to report to care team affect of pain on daily activities; Discussed use of relaxation techniques and/or diversional activities to assist with pain reduction (distraction, imagery, relaxation, massage, acupressure, TENS, heat, and cold application; Reviewed with patient prescribed pharmacological and nonpharmacological pain relief strategies; Advised patient to discuss unresolved pain, changes in level or intensity of pain with provider;   Patient Goals/Self-Care Activities: Take medications as prescribed   Attend all scheduled provider appointments Call pharmacy for medication refills 3-7 days in advance of running out of medications Attend church or other social activities Perform all self care activities independently  Perform IADL's (shopping, preparing meals, housekeeping, managing finances) independently Call provider office for new concerns or questions  Work with the social worker to address care coordination needs and will continue to work with the clinical team to address health care and disease management related needs call the Suicide and Crisis Lifeline: 988 call the [place] National Suicide Prevention Lifeline: (636)724-3762 or TTY: (601)674-0154  TTY (339)812-5671) to talk to a trained counselor call 1-800-273-TALK (toll free, 24 hour hotline) if experiencing a Mental Health or [place]  check blood pressure  daily choose a place to take my blood pressure (home, clinic or office, retail store) write blood pressure results in a log or diary learn about high blood pressure keep a blood pressure log take blood pressure log to all doctor appointments call doctor for signs and symptoms of high blood pressure develop an action plan for high blood pressure keep all doctor appointments take medications for blood pressure exactly as prescribed report new symptoms to your doctor eat more whole grains, fruits and vegetables, lean meats and healthy fats - call for medicine refill 2 or 3 days before it runs out - take all medications exactly as prescribed - call doctor with any symptoms you believe are related to your medicine - call doctor when you experience any new symptoms - go to all doctor appointments as scheduled - adhere to prescribed diet: heart healthy diet      Our next appointment is by telephone on 08-08-2021 at 0900 am  Please call the care guide team at [phone] if you need to cancel or reschedule your appointment.   If you are experiencing a Mental Health or [place] or need someone to talk to, please call the Suicide and Crisis Lifeline: 988 call the [place] National Suicide Prevention Lifeline: (541)030-6911 or TTY: 484 355 8495 TTY 671-170-8596) to talk to a trained counselor call 1-800-273-TALK (toll free, 24 hour hotline)   Patient verbalizes understanding of instructions provided today and agrees to view in [place].   Noreene Larsson RN, MSN, [place] Mobile: [phone]

## 2021-06-06 NOTE — Chronic Care Management (AMB) (Signed)
Chronic Care Management   CCM RN Visit Note  06/06/2021 Name: Jesse Figueroa MRN: [id number] DOB: Sep 12, 1950  Subjective: Jesse Figueroa is a 70 y.o. year old male who is a primary care patient of Olin Hauser, DO. The care management team was consulted for assistance with disease management and care coordination needs.    Engaged with patient by telephone for follow up visit in response to provider referral for case management and/or care coordination services.   Consent to Services:  The patient was given information about Chronic Care Management services, agreed to services, and gave verbal consent prior to initiation of services.  Please see initial visit note for detailed documentation.   Patient agreed to services and verbal consent obtained.   Assessment: Review of patient past medical history, allergies, medications, health status, including review of consultants reports, laboratory and other test data, was performed as part of comprehensive evaluation and provision of chronic care management services.   SDOH (Social Determinants of Health) assessments and interventions performed:    CCM Care Plan  No Known Allergies  Outpatient Encounter Medications as of 06/06/2021  Medication Sig Note   aspirin 81 MG chewable tablet Chew by mouth.    atorvastatin (LIPITOR) 10 MG tablet Take 1 tablet (10 mg total) by mouth daily.    baclofen (LIORESAL) 10 MG tablet Take 0.5-1 tablets (5-10 mg total) by mouth 3 (three) times daily as needed for muscle spasms. (Patient not taking: Reported on 05/13/2021)    calcium carbonate (TUMS - DOSED IN MG ELEMENTAL CALCIUM) 500 MG chewable tablet Chew 1 tablet by mouth daily as needed for indigestion or heartburn. 04/20/2020: Reports using ~ once/week   diclofenac Sodium (VOLTAREN) 1 % GEL Apply 2 g topically 4 (four) times daily as needed (joint pain arthritis shoulder).    fluticasone (FLONASE) 50 MCG/ACT nasal spray USE 2 SPRAYS IN BOTH  NOSTRILS DAILY. USE FOR 4 TO 6 WEEKS THEN STOP AND USE SEASONALLY OR AS NEEDED.    losartan (COZAAR) 100 MG tablet Take 1 tablet (100 mg total) by mouth daily. (Patient taking differently: Take 50 mg by mouth daily.)    propranolol (INDERAL) 20 MG tablet Take 1 tablet (20 mg total) by mouth 2 (two) times daily.    traZODone (DESYREL) 50 MG tablet Take 100 mg by mouth at bedtime.    No facility-administered encounter medications on file as of 06/06/2021.    Patient Active Problem List   Diagnosis Date Noted   Rash and nonspecific skin eruption 03/27/2020   Influenza vaccine needed 03/27/2020   Cyst of right kidney 04/18/2019   CKD (chronic kidney disease), stage II 09/21/2017   Arthritis of hand 05/01/2016   Bradycardia 12/04/2015   Pre-diabetes 10/15/2015   Hyperlipidemia 07/09/2015   Benign hypertension with CKD (chronic kidney disease), stage II 04/09/2015   Insomnia 10/03/2014   Essential tremor 10/03/2014    Conditions to be addressed/monitored:HTN, HLD, CKD Stage 2, and chronic pain   Care Plan : RNCM: Hypertension (Adult)  Updates made by Vanita Ingles, RN since 06/06/2021 12:00 AM  Completed 06/06/2021   Problem: RNCM: Hypertension (Hypertension) Resolved 06/06/2021  Priority: Medium     Long-Range Goal: RNCM: Hypertension Monitored Completed 06/06/2021  Start Date: 09/10/2020  Expected End Date: 01/16/2022  Recent Progress: On track  Priority: Medium  Note:   Objective: Resolving, duplicated goal Last practice recorded BP readings:  BP Readings from Last 3 Encounters:  03/20/21 138/84  11/15/20 122/80  09/04/20 126/77  Most recent eGFR/CrCl: No results found for: EGFR  No components found for: CRCL Current Barriers:  Knowledge Deficits related to basic understanding of hypertension pathophysiology and self care management Knowledge Deficits related to understanding of medications prescribed for management of hypertension Does not contact provider office for  questions/concerns Case Manager Clinical Goal(s):  patient will verbalize understanding of plan for hypertension management patient will demonstrate improved adherence to prescribed treatment plan for hypertension as evidenced by taking all medications as prescribed, monitoring and recording blood pressure as directed, adhering to low sodium/DASH diet patient will demonstrate improved health management independence as evidenced by checking blood pressure as directed and notifying PCP if SBP>160 or DBP > 90, taking all medications as prescribe, and adhering to a low sodium diet as discussed. patient will verbalize basic understanding of hypertension disease process and self health management plan as evidenced by compliance with medications, compliance with heart healthy diet, and working with the CCM team to manage health and well being.  Interventions:  Collaboration with Olin Hauser, DO regarding development and update of comprehensive plan of care as evidenced by provider attestation and co-signature Inter-disciplinary care team collaboration (see longitudinal plan of care) Evaluation of current treatment plan related to hypertension self management and patient's adherence to plan as established by provider. 01-21-2021: The patient is compliant with the poc and denies any new concerns related to HTN at this time. Follow up with the pcp in June. No new concerns. Did go on 11-19-2020 to get 2nd booster shot for COVID- record updated.  Discussed flu vaccine and the patient states he received in the office. The patient has follow up consultation for colonoscopy on 01-28-2021. 04-01-2021: The patient saw the pcp on 03-20-2021. Blood pressure a little elevated but he has been having pain in his bilateral shoulders (see care plan for chronic pain). The patient states he is having some stomach upset and takes tums when he needs to. Is scheduled for a colonoscopy in November. He states he has had them  before.  He states he had his flu vaccine in the office on 03-20-2021 and is going to [facility] tomorrow for booster for COVID. Denies any issues with his blood pressures.  Provided education to patient re: stroke prevention, s/s of heart attack and stroke, DASH diet, complications of uncontrolled blood pressure. 01-28-2021: The patient states he is eating good and denies any acute findings. The patient states he is not resting as well as he would like to.  He says he has no problem going to sleep but if he wakes up it is hard for him to go back to sleep. Discussed sleep hygiene.  He does not mind the heat outside.  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 He also mows his yard. He stays pretty active.   States his conditions are stable at this time. 04-01-2021: Denies any issues with medications compliance and dietary restrictions. Review of heart healthy habits.  Reviewed medications with patient and discussed importance of compliance. 04-01-2021: Verbalized compliance with his medications regimen.  Discussed plans with patient for ongoing care management follow up and provided patient with direct contact information for care management team Advised patient, providing education and rationale, to monitor blood pressure daily and record, calling PCP for findings outside established parameters.  Self-Care Activities: - Self administers medications as prescribed Attends all scheduled provider appointments Calls provider office for new concerns, questions, or BP outside discussed parameters Checks BP and  records as discussed Follows a low sodium diet/DASH diet Patient Goals: - check blood pressure weekly - choose a place to take my blood pressure (home, clinic or office, retail store) - write blood pressure results in a log or diary - agree on reward when goals are met - agree to work together to make changes - ask questions to understand - learn about high blood pressure -  blood pressure equipment and technique reviewed - blood pressure trends reviewed - depression screen reviewed - home or ambulatory blood pressure monitoring encouraged  Follow Up Plan: Telephone follow up appointment with care management team member scheduled for: 06-06-2021 at 0900 am    Care Plan : RNCM: Chronic Pain (Adult)  Updates made by Vanita Ingles, RN since 06/06/2021 12:00 AM  Completed 06/06/2021   Problem: RNCM: Pain Management Plan (Chronic Pain) Resolved 06/06/2021  Priority: High     Long-Range Goal: RNCM: Pain Management Plan Developed Completed 06/06/2021  Start Date: 09/10/2020  Expected End Date: 01/16/2022  Recent Progress: On track  Priority: High  Note:   Current Barriers: Resolving, duplicate goal Knowledge Deficits related to managing acute/chronic pain Non-adherence to scheduled provider appointments Non-adherence to prescribed medication regimen Difficulty obtaining medications Chronic Disease Management support and education needs related to chronic pain Unable to independently manage chronic pain and discomfort effectively  Does not contact provider office for questions/concerns Nurse Case Manager Clinical Goal(s):  patient will verbalize understanding of plan for managing pain patient will attend all scheduled medical appointments: patient to schedule appointment for 2 weeks patient will demonstrate use of different relaxation  skills and/or diversional activities to assist with pain reduction (distraction, imagery, relaxation, massage, acupressure, TENS, heat, and cold application patient will report pain at a level less than 3 to 4 on a 10-10 rating scale patient will use pharmacological and nonpharmacological pain relief strategies patient will verbalize acceptable level of pain relief and ability to engage in desired activities patient will engage in desired activities without an increase in pain level Interventions:  Collaboration with  Parks Ranger Devonne Doughty, DO regarding development and update of comprehensive plan of care as evidenced by provider attestation and co-signature Inter-disciplinary care team collaboration (see longitudinal plan of care) - careful application of heat or ice encouraged- 10-29-2020: The patient is using heat application without difficulty. The patient states the heat does help with his pain at times. Review of 20 minutes on and then 20 minutes off.  - deep breathing, relaxation and mindfulness use promoted - effectiveness of pharmacologic therapy monitored. 01-21-2021: The patient states that the gel is not very effective in helping with his pain. He has some to use. 04-01-2021: The patient states the baclofen 10 mg TID prn is effective with helping with his pain and he takes as needed.  - medication-induced side effects managed - misuse of pain medication assessed- 04-01-2021: No issues noted.  - motivation and barriers to change assessed and addressed - mutually acceptable comfort goal set. 04-01-2021: the patient rates his pain level at a 3 today on a scale of 0-10. States he is feeling much better since seeing the pcp on 03-20-2021.  - pain assessed. 01-21-2021: The patient denies any pain at this time. States most of the time his pain is worse in the am when he first gets up. Will continue to monitor. The patient states he has not had pain in several days. He monitors closely. He denies any bad flare ups with his pain. States it comes and goes.  He has not been limited in the activity he does. Will continue to monitor for changes in pain. 04-01-2021: Rates pain at a 3 today on a scale of 0-10. States that he takes baclofen when needed. He also has an order for PT with [facility] but he has not called to set it up yet. Denies any acute findings today. Knows to call the pcp for changes in level or intensity of pain and discomfort.  - pain treatment goals reviewed. 01-21-2021: The patient says his pain is about the  same, not worse. 04-01-2021: The patient states his pain level is better today and he feel better.  - premedication prior to activity encouraged Evaluation of current treatment plan related to pain management to pain in bilateral shoulders with left rotator cuff involvement  and patient's adherence to plan as established by provider. Advised patient to call the office for changes in level or intensity of pain. Saw pcp on 03-20-2021 and is following the plan of care Provided education to patient re: alternative pain relieve measures, reporting changes in pain, working with the pcp and CCM team to effectively manage pain and discomfort.  Reviewed medications with patient and discussed compliance.  The patient states he can not see any difference in his pain level with medications use. The stimulation device is helpful. The patient will follow up with the pcp in 2 weeks for reevaluation. 04-01-2021: The patient is compliant with medications. No issues noted.  Discussed plans with patient for ongoing care management follow up and provided patient with direct contact information for care management team Allow patient to maintain a diary of pain ratings, timing, precipitating events, medications, treatments, and what works best to relieve pain,  Refer to support groups and self-help groups Educate patient about the use of pharmacological interventions for pain management- antianxiety, antidepressants, NSAIDS, opioid analgesics,  Explain the importance of lifestyle modifications to effective pain management  Patient Goals/Self Care Activities:  - mutually acceptable comfort goal set - pain assessed - pain management plan developed - pain treatment goals reviewed - patient response to treatment assessed - sharing of pain management plan with teachers and other caregivers encouraged Self-administers medications as prescribed Attends all scheduled provider appointments Calls pharmacy for medication  refills Calls provider office for new concerns or questions Follow Up Plan: Telephone follow up appointment with care management team member scheduled for: 06-06-2021 at [place] : RNCM: HLD Management  Updates made by Vanita Ingles, RN since 06/06/2021 12:00 AM  Completed 06/06/2021   Problem: Health Promotion or Disease Self-Management (General Plan of Care) Resolved 06/06/2021     Long-Range Goal: RNCM: Management of HLD Completed 06/06/2021  Start Date: 09/10/2020  Expected End Date: 01/16/2022  Recent Progress: On track  Priority: Medium  Note:   Current Barriers: Resolving, duplicate goal  Poorly controlled hyperlipidemia, complicated by chronic pain Current antihyperlipidemic regimen: Lipitor 10 mg Most recent lipid panel:  Lab Results  Component Value Date   CHOL 144 11/16/2020   CHOL 135 11/16/2019   CHOL 195 11/23/2018   Lab Results  Component Value Date   HDL 48 11/16/2020   HDL 49 11/16/2019   HDL 46 11/23/2018   Lab Results  Component Value Date   LDLCALC 76 11/16/2020   LDLCALC 72 11/16/2019   LDLCALC 130 (H) 11/23/2018   Lab Results  Component Value Date   TRIG 110 11/16/2020   TRIG 59 11/16/2019   TRIG 86 11/23/2018  Lab Results  Component Value Date   CHOLHDL 3.0 11/16/2020   CHOLHDL 2.8 11/16/2019   CHOLHDL 4.2 11/23/2018   No results found for: LDLDIRECT  ASCVD risk enhancing conditions: age >52, HTN, CKD Unable to independently manage HLD Does not contact provider office for questions/concerns RN Care Manager Clinical Goal(s):  patient will work with Consulting civil engineer, providers, and care team towards execution of optimized self-health management plan patient will verbalize understanding of plan for effective management of HLD patient will work with RNCM, pcp, and CCM team to address needs related to effective management of HLD Interventions: Collaboration with Olin Hauser, DO regarding development and update  of comprehensive plan of care as evidenced by provider attestation and co-signature Inter-disciplinary care team collaboration (see longitudinal plan of care) Medication review performed; medication list updated in electronic medical record. 04-01-2021: The patient is compliant with medications.   Inter-disciplinary care team collaboration (see longitudinal plan of care) Referred to pharmacy team for assistance with HLD medication management Evaluation of current treatment plan related to HLD and patient's adherence to plan as established by provider. 04-01-2021: The patient denies any new concerns with HLD management. The patient states that he is eating well and following Heart healthy diet. Will continue to monitor. Labs at recent physical were WNL. Praised the patient for good results.  Advised patient to call the office for changes or questions Provided education to patient re: heart healthy diet, activity and effective management of HLD Discussed plans with patient for ongoing care management follow up and provided patient with direct contact information for care management team Patient Goals/Self-Care Activities: - call for medicine refill 2 or 3 days before it runs out - call if I am sick and can't take my medicine - keep a list of all the medicines I take; vitamins and herbals too - learn to read medicine labels - use a pillbox to sort medicine - use an alarm clock or phone to remind me to take my medicine - drink 6 to 8 glasses of water each day - eat 3 to 5 servings of fruits and vegetables each day - eat 5 or 6 small meals each day - fill half the plate with nonstarchy vegetables - limit fast food meals to no more than 1 per week - manage portion size - prepare main meal at home 3 to 5 days each week - read food labels for fat, fiber, carbohydrates and portion size - be open to making changes - I can manage, know and watch for signs of a heart attack - if I have chest pain, call  for help - learn about small changes that will make a big difference - learn my personal risk factors  - advance care planning facilitated - barriers to meeting goals identified - change-talk evoked - choices provided - collaboration with team encouraged - decision-making supported - difficulty of making life-long changes acknowledged - health risks reviewed - problem-solving facilitated - questions answered - readiness for change evaluated - reassurance provided - self-reflection promoted - self-reliance encouraged  Follow Up Plan: Telephone follow up appointment with care management team member scheduled for: 06-06-2021 at 0900 am      Care Plan : RNCM: General Plan of Care (Adult) for Chronic Disease Management and Care Coordination Needs  Updates made by Vanita Ingles, RN since 06/06/2021 12:00 AM     Problem: RNCM: Development of Plan of Care for Chronic Disease Management (HTN, HLD, CKD2, Chronic Pain)   Priority:  High     Long-Range Goal: RNCM: Effective Management of Plan of Care for Chronic Disease Management (HTN, HLD, CKD2, Chronic Pain)   Start Date: 06/06/2021  Expected End Date: 06/06/2022  Priority: High  Note:   Current Barriers:  Knowledge Deficits related to plan of care for management of HTN, HLD, CKD Stage 2, and chronic pain  Chronic Disease Management support and education needs related to HTN, HLD, CKD Stage 2, and chronic pain   RNCM Clinical Goal(s):  Patient will verbalize basic understanding of HTN, HLD, CKD Stage 2, and Chronic pain  disease process and self health management plan as evidenced by keeping appointments, following the plan of care, and working with the pcp and CCM team to effectively manage health and well being  take all medications exactly as prescribed and will call provider for medication related questions as evidenced by compliance with medications and calling the refill needs before running out of medications     attend all  scheduled medical appointments: no upcoming with the pcp but states he will call if needed  as evidenced by keeping appointments and calling for schedule change needs        demonstrate improved and ongoing health management independence as evidenced by stable conditions, VS WNL, regular lab work and working with the CCM team to optimize health and well being        demonstrate ongoing self health care management ability for effective management of Chronic conditions  as evidenced by  working  through collaboration with Consulting civil engineer, provider, and care team.   Interventions: 1:1 collaboration with primary care provider regarding development and update of comprehensive plan of care as evidenced by provider attestation and co-signature Inter-disciplinary care team collaboration (see longitudinal plan of care) Evaluation of current treatment plan related to  self management and patient's adherence to plan as established by provider   Chronic Kidney Disease (Status: Goal on Track (progressing): YES.)  Long Term Goal  Last practice recorded BP readings:  BP Readings from Last 3 Encounters:  06/06/21 129/84  05/13/21 (!) 128/92  03/20/21 138/84  Most recent eGFR/CrCl: No results found for: EGFR  No components found for: CRCL  Assessed the patient     understanding of chronic kidney disease    Evaluation of current treatment plan related to chronic kidney disease self management and patient's adherence to plan as established by provider      Provided education to patient re: stroke prevention, s/s of heart attack and stroke    Reviewed prescribed diet heart healthy Reviewed medications with patient and discussed importance of compliance    Advised patient, providing education and rationale, to monitor blood pressure daily and record, calling PCP for findings outside established parameters    Discussed complications of poorly controlled blood pressure such as heart disease, stroke, circulatory  complications, vision complications, kidney impairment, sexual dysfunction    Reviewed scheduled/upcoming provider appointments including: No upcoming appointments, knows to call the office for changes   Discussed plans with patient for ongoing care management follow up and provided patient with direct contact information for care management team    Screening for signs and symptoms of depression related to chronic disease state      Discussed the impact of chronic kidney disease on daily life and mental health and acknowledged and normalized feelings of disempowerment, fear, and frustration    Assessed social determinant of health barriers    Provided education on kidney disease progression  Engage patient in early, proactive and ongoing discussion about goals of care and what matters most to them    Support coping and stress management by recognizing current strategies and assist in developing new strategies such as mindfulness, journaling, relaxation techniques, problem-solving      Hyperlipidemia:  (Status: Goal on Track (progressing): YES.) Long Term Goal  Lab Results  Component Value Date   CHOL 144 11/16/2020   HDL 48 11/16/2020   LDLCALC 76 11/16/2020   TRIG 110 11/16/2020   CHOLHDL 3.0 11/16/2020     Medication review performed; medication list updated in electronic medical record.  Provider established cholesterol goals reviewed; Counseled on importance of regular laboratory monitoring as prescribed; Provided HLD educational materials; Reviewed role and benefits of statin for ASCVD risk reduction; Discussed strategies to manage statin-induced myalgias; Reviewed importance of limiting foods high in cholesterol; Screening for signs and symptoms of depression related to chronic disease state;  Assessed social determinant of health barriers;   Hypertension: (Status: Goal on Track (progressing): YES.) Last practice recorded BP readings:  BP Readings from Last 3 Encounters:   06/06/21 129/84  05/13/21 (!) 128/92  03/20/21 138/84  Most recent eGFR/CrCl: No results found for: EGFR  No components found for: CRCL  Evaluation of current treatment plan related to hypertension self management and patient's adherence to plan as established by provider;   Provided education to patient re: stroke prevention, s/s of heart attack and stroke; Reviewed prescribed diet heart healthy diet Reviewed medications with patient and discussed importance of compliance;  Discussed plans with patient for ongoing care management follow up and provided patient with direct contact information for care management team; Advised patient, providing education and rationale, to monitor blood pressure daily and record, calling PCP for findings outside established parameters;  Provided education on prescribed diet heart healthy diet ;  Discussed complications of poorly controlled blood pressure such as heart disease, stroke, circulatory complications, vision complications, kidney impairment, sexual dysfunction;   Pain:  (Status: Goal on Track (progressing): YES.) Long Term Goal  Pain assessment performed. 06-06-2021: The patient denies any pain this morning. States it comes and goes. It is more intense when he works a lot and does things where he is raising his arms and hands. States it varies daily but does not wake him from his sleep. Will continue to monitor for changes and needs.  Medications reviewed Reviewed provider established plan for pain management; Discussed importance of adherence to all scheduled medical appointments; Counseled on the importance of reporting any/all new or changed pain symptoms or management strategies to pain management provider; Advised patient to report to care team affect of pain on daily activities; Discussed use of relaxation techniques and/or diversional activities to assist with pain reduction (distraction, imagery, relaxation, massage, acupressure, TENS, heat,  and cold application; Reviewed with patient prescribed pharmacological and nonpharmacological pain relief strategies; Advised patient to discuss unresolved pain, changes in level or intensity of pain with provider;  Patient Goals/Self-Care Activities: Take medications as prescribed   Attend all scheduled provider appointments Call pharmacy for medication refills 3-7 days in advance of running out of medications Attend church or other social activities Perform all self care activities independently  Perform IADL's (shopping, preparing meals, housekeeping, managing finances) independently Call provider office for new concerns or questions  Work with the social worker to address care coordination needs and will continue to work with the clinical team to address health care and disease management related needs call the Suicide and Crisis  Lifeline: 988 call the [place] National Suicide Prevention Lifeline: (813) 453-0716 or TTY: 4044069453 TTY (405) 391-8222) to talk to a trained counselor call 1-800-273-TALK (toll free, 24 hour hotline) if experiencing a [place] or [place]  check blood pressure daily choose a place to take my blood pressure (home, clinic or office, retail store) write blood pressure results in a log or diary learn about high blood pressure keep a blood pressure log take blood pressure log to all doctor appointments call doctor for signs and symptoms of high blood pressure develop an action plan for high blood pressure keep all doctor appointments take medications for blood pressure exactly as prescribed report new symptoms to your doctor eat more whole grains, fruits and vegetables, lean meats and healthy fats - call for medicine refill 2 or 3 days before it runs out - take all medications exactly as prescribed - call doctor with any symptoms you believe are related to your medicine - call doctor when you experience any new symptoms - go to all doctor  appointments as scheduled - adhere to prescribed diet: heart healthy diet       Plan:Telephone follow up appointment with care management team member scheduled for:  08-08-2021 at 0900 am  [place], MSN, [place] Mobile: [phone]

## 2021-06-15 DIAGNOSIS — E7849 Other hyperlipidemia: Secondary | ICD-10-CM

## 2021-06-15 DIAGNOSIS — I10 Essential (primary) hypertension: Secondary | ICD-10-CM

## 2021-06-15 DIAGNOSIS — N182 Chronic kidney disease, stage 2 (mild): Secondary | ICD-10-CM | POA: Diagnosis not present

## 2021-06-15 DIAGNOSIS — I129 Hypertensive chronic kidney disease with stage 1 through stage 4 chronic kidney disease, or unspecified chronic kidney disease: Secondary | ICD-10-CM | POA: Diagnosis not present

## 2021-08-08 ENCOUNTER — Ambulatory Visit (INDEPENDENT_AMBULATORY_CARE_PROVIDER_SITE_OTHER): Payer: No Typology Code available for payment source

## 2021-08-08 ENCOUNTER — Telehealth: Payer: Medicare HMO

## 2021-08-08 DIAGNOSIS — E7849 Other hyperlipidemia: Secondary | ICD-10-CM

## 2021-08-08 DIAGNOSIS — M7551 Bursitis of right shoulder: Secondary | ICD-10-CM

## 2021-08-08 DIAGNOSIS — I10 Essential (primary) hypertension: Secondary | ICD-10-CM

## 2021-08-08 DIAGNOSIS — M25512 Pain in left shoulder: Secondary | ICD-10-CM

## 2021-08-08 DIAGNOSIS — N182 Chronic kidney disease, stage 2 (mild): Secondary | ICD-10-CM

## 2021-08-08 DIAGNOSIS — G8929 Other chronic pain: Secondary | ICD-10-CM

## 2021-08-08 NOTE — Patient Instructions (Signed)
Visit Information  Thank you for taking time to visit with me today. Please don't hesitate to contact me if I can be of assistance to you before our next scheduled telephone appointment.  Following are the goals we discussed today:  RNCM Clinical Goal(s):  Patient will verbalize basic understanding of HTN, HLD, CKD Stage 2, and Chronic pain  disease process and self health management plan as evidenced by keeping appointments, following the plan of care, and working with the pcp and CCM team to effectively manage health and well being  take all medications exactly as prescribed and will call provider for medication related questions as evidenced by compliance with medications and calling the refill needs before running out of medications     attend all scheduled medical appointments: no upcoming with the pcp but states he will call if needed  as evidenced by keeping appointments and calling for schedule change needs        demonstrate improved and ongoing health management independence as evidenced by stable conditions, VS WNL, regular lab work and working with the CCM team to optimize health and well being        demonstrate ongoing self health care management ability for effective management of Chronic conditions  as evidenced by  working  through Ambulance person with Consulting civil engineer, provider, and care team.    Interventions: 1:1 collaboration with primary care provider regarding development and update of comprehensive plan of care as evidenced by provider attestation and co-signature Inter-disciplinary care team collaboration (see longitudinal plan of care) Evaluation of current treatment plan related to  self management and patient's adherence to plan as established by provider     Chronic Kidney Disease (Status: Goal on Track (progressing): YES.)  Long Term Goal  Last practice recorded BP readings:     BP Readings from Last 3 Encounters:  06/06/21 129/84  05/13/21 (!) 128/92  03/20/21  138/84  Most recent eGFR/CrCl: No results found for: EGFR  No components found for: CRCL   Assessed the patient     understanding of chronic kidney disease    Evaluation of current treatment plan related to chronic kidney disease self management and patient's adherence to plan as established by provider. 08-08-2021: The patient is compliant with the plan of care for his CKD and denies any new changes at this time in his condition     Provided education to patient re: stroke prevention, s/s of heart attack and stroke    Reviewed prescribed diet heart healthy. 08-08-2021: The patient is mindful of his dietary restrictions and monitors his dietary intake Reviewed medications with patient and discussed importance of compliance. 08-08-2021: The patient is compliant with his medications   Advised patient, providing education and rationale, to monitor blood pressure daily and record, calling PCP for findings outside established parameters    Discussed complications of poorly controlled blood pressure such as heart disease, stroke, circulatory complications, vision complications, kidney impairment, sexual dysfunction    Reviewed scheduled/upcoming provider appointments including: No upcoming appointments, knows to call the office for changes.   Discussed plans with patient for ongoing care management follow up and provided patient with direct contact information for care management team    Screening for signs and symptoms of depression related to chronic disease state      Discussed the impact of chronic kidney disease on daily life and mental health and acknowledged and normalized feelings of disempowerment, fear, and frustration    Assessed social determinant of health barriers  Provided education on kidney disease progression    Engage patient in early, proactive and ongoing discussion about goals of care and what matters most to them    Support coping and stress management by recognizing current  strategies and assist in developing new strategies such as mindfulness, journaling, relaxation techniques, problem-solving        Hyperlipidemia:  (Status: Goal on Track (progressing): YES.) Long Term Goal       Lab Results  Component Value Date    CHOL 144 11/16/2020    HDL 48 11/16/2020    LDLCALC 76 11/16/2020    TRIG 110 11/16/2020    CHOLHDL 3.0 11/16/2020      Medication review performed; medication list updated in electronic medical record. 08-08-2021: The patient takes Lipitor 10 mg daily for control of his cholesterol Provider established cholesterol goals reviewed. 08-08-2021: The patient is at goal with his cholesterol levels; Counseled on importance of regular laboratory monitoring as prescribed. 08-08-2021: The patient has regular lab testing to check lipid panel Provided HLD educational materials; Reviewed role and benefits of statin for ASCVD risk reduction; Discussed strategies to manage statin-induced myalgias; Reviewed importance of limiting foods high in cholesterol; Screening for signs and symptoms of depression related to chronic disease state;  Assessed social determinant of health barriers;    Hypertension: (Status: Goal on Track (progressing): YES.) Last practice recorded BP readings:     BP Readings from Last 3 Encounters:  06/06/21 129/84  05/13/21 (!) 128/92  03/20/21 138/84  Most recent eGFR/CrCl: No results found for: EGFR  No components found for: CRCL   Evaluation of current treatment plan related to hypertension self management and patient's adherence to plan as established by provider. 08-08-2021: The patient has good control of his HTN and denies any new concerns with HTN or heart health   Provided education to patient re: stroke prevention, s/s of heart attack and stroke; Reviewed prescribed diet heart healthy diet Reviewed medications with patient and discussed importance of compliance. 08-08-2021: The patient is compliant with medications;   Discussed plans with patient for ongoing care management follow up and provided patient with direct contact information for care management team; Advised patient, providing education and rationale, to monitor blood pressure daily and record, calling PCP for findings outside established parameters;  Provided education on prescribed diet heart healthy diet ;  Discussed complications of poorly controlled blood pressure such as heart disease, stroke, circulatory complications, vision complications, kidney impairment, sexual dysfunction;    Pain:  (Status: Goal Met.) Long Term Goal 08-08-2021: The patient denies any pain and feels his shoulders are doing much better. Closing this goal right now. Will monitor for changes.  Pain assessment performed. 06-06-2021: The patient denies any pain this morning. States it comes and goes. It is more intense when he works a lot and does things where he is raising his arms and hands. States it varies daily but does not wake him from his sleep. Will continue to monitor for changes and needs.  Medications reviewed Reviewed provider established plan for pain management; Discussed importance of adherence to all scheduled medical appointments; Counseled on the importance of reporting any/all new or changed pain symptoms or management strategies to pain management provider; Advised patient to report to care team affect of pain on daily activities; Discussed use of relaxation techniques and/or diversional activities to assist with pain reduction (distraction, imagery, relaxation, massage, acupressure, TENS, heat, and cold application; Reviewed with patient prescribed pharmacological and nonpharmacological pain relief strategies; Advised  patient to discuss unresolved pain, changes in level or intensity of pain with provider;   Patient Goals/Self-Care Activities: Take medications as prescribed   Attend all scheduled provider appointments Call pharmacy for medication refills  3-7 days in advance of running out of medications Attend church or other social activities Perform all self care activities independently  Perform IADL's (shopping, preparing meals, housekeeping, managing finances) independently Call provider office for new concerns or questions  Work with the social worker to address care coordination needs and will continue to work with the clinical team to address health care and disease management related needs call the Suicide and Crisis Lifeline: 988 call the [place] National Suicide Prevention Lifeline: 825-886-3337 or TTY: (601)327-6804 TTY (281)077-3391) to talk to a trained counselor call 1-800-273-TALK (toll free, 24 hour hotline) if experiencing a Mental Health or [place]  check blood pressure daily choose a place to take my blood pressure (home, clinic or office, retail store) write blood pressure results in a log or diary learn about high blood pressure keep a blood pressure log take blood pressure log to all doctor appointments call doctor for signs and symptoms of high blood pressure develop an action plan for high blood pressure keep all doctor appointments take medications for blood pressure exactly as prescribed report new symptoms to your doctor eat more whole grains, fruits and vegetables, lean meats and healthy fats - call for medicine refill 2 or 3 days before it runs out - take all medications exactly as prescribed - call doctor with any symptoms you believe are related to your medicine - call doctor when you experience any new symptoms - go to all doctor appointments as scheduled - adhere to prescribed diet: heart healthy diet      Our next appointment is by telephone on 10-31-2021 at 0900 am  Please call the care guide team at [phone] if you need to cancel or reschedule your appointment.   If you are experiencing a Mental Health or [place] or need someone to talk to, please call the  Suicide and Crisis Lifeline: 988 call the [place] National Suicide Prevention Lifeline: (202)407-4544 or TTY: 360-461-8678 TTY (272)185-7894) to talk to a trained counselor call 1-800-273-TALK (toll free, 24 hour hotline)   Patient verbalizes understanding of instructions and care plan provided today and agrees to view in [place]. Active MyChart status confirmed with patient.    Noreene Larsson RN, MSN, [place] Mobile: [phone]

## 2021-08-08 NOTE — Chronic Care Management (AMB) (Signed)
Chronic Care Management   CCM RN Visit Note  08/08/2021 Name: Jesse Figueroa MRN: [id number] DOB: 1950/12/22  Subjective: Jesse Figueroa is a 71 y.o. year old male who is a primary care patient of Olin Hauser, DO. The care management team was consulted for assistance with disease management and care coordination needs.    Engaged with patient by telephone for follow up visit in response to provider referral for case management and/or care coordination services.   Consent to Services:  The patient was given information about Chronic Care Management services, agreed to services, and gave verbal consent prior to initiation of services.  Please see initial visit note for detailed documentation.   Patient agreed to services and verbal consent obtained.   Assessment: Review of patient past medical history, allergies, medications, health status, including review of consultants reports, laboratory and other test data, was performed as part of comprehensive evaluation and provision of chronic care management services.   SDOH (Social Determinants of Health) assessments and interventions performed:    CCM Care Plan  No Known Allergies  Outpatient Encounter Medications as of 08/08/2021  Medication Sig Note   aspirin 81 MG chewable tablet Chew by mouth.    atorvastatin (LIPITOR) 10 MG tablet Take 1 tablet (10 mg total) by mouth daily.    baclofen (LIORESAL) 10 MG tablet Take 0.5-1 tablets (5-10 mg total) by mouth 3 (three) times daily as needed for muscle spasms. (Patient not taking: Reported on 05/13/2021)    calcium carbonate (TUMS - DOSED IN MG ELEMENTAL CALCIUM) 500 MG chewable tablet Chew 1 tablet by mouth daily as needed for indigestion or heartburn. 04/20/2020: Reports using ~ once/week   diclofenac Sodium (VOLTAREN) 1 % GEL Apply 2 g topically 4 (four) times daily as needed (joint pain arthritis shoulder).    fluticasone (FLONASE) 50 MCG/ACT nasal spray USE 2 SPRAYS IN BOTH  NOSTRILS DAILY. USE FOR 4 TO 6 WEEKS THEN STOP AND USE SEASONALLY OR AS NEEDED.    losartan (COZAAR) 100 MG tablet Take 1 tablet (100 mg total) by mouth daily. (Patient taking differently: Take 50 mg by mouth daily.)    propranolol (INDERAL) 20 MG tablet Take 1 tablet (20 mg total) by mouth 2 (two) times daily.    traZODone (DESYREL) 50 MG tablet Take 100 mg by mouth at bedtime.    No facility-administered encounter medications on file as of 08/08/2021.    Patient Active Problem List   Diagnosis Date Noted   Rash and nonspecific skin eruption 03/27/2020   Influenza vaccine needed 03/27/2020   Cyst of right kidney 04/18/2019   CKD (chronic kidney disease), stage II 09/21/2017   Arthritis of hand 05/01/2016   Bradycardia 12/04/2015   Pre-diabetes 10/15/2015   Hyperlipidemia 07/09/2015   Benign hypertension with CKD (chronic kidney disease), stage II 04/09/2015   Insomnia 10/03/2014   Essential tremor 10/03/2014    Conditions to be addressed/monitored:HTN, HLD, CKD Stage 2, and chronic pain  Care Plan : RNCM: General Plan of Care (Adult) for Chronic Disease Management and Care Coordination Needs  Updates made by Vanita Ingles, RN since 08/08/2021 12:00 AM     Problem: RNCM: Development of Plan of Care for Chronic Disease Management (HTN, HLD, CKD2, Chronic Pain)   Priority: High     Long-Range Goal: RNCM: Effective Management of Plan of Care for Chronic Disease Management (HTN, HLD, CKD2, Chronic Pain)   Start Date: 06/06/2021  Expected End Date: 06/06/2022  Priority: High  Note:  Current Barriers:  Knowledge Deficits related to plan of care for management of HTN, HLD, CKD Stage 2, and chronic pain  Chronic Disease Management support and education needs related to HTN, HLD, CKD Stage 2, and chronic pain   RNCM Clinical Goal(s):  Patient will verbalize basic understanding of HTN, HLD, CKD Stage 2, and Chronic pain  disease process and self health management plan as evidenced by  keeping appointments, following the plan of care, and working with the pcp and CCM team to effectively manage health and well being  take all medications exactly as prescribed and will call provider for medication related questions as evidenced by compliance with medications and calling the refill needs before running out of medications     attend all scheduled medical appointments: no upcoming with the pcp but states he will call if needed  as evidenced by keeping appointments and calling for schedule change needs        demonstrate improved and ongoing health management independence as evidenced by stable conditions, VS WNL, regular lab work and working with the CCM team to optimize health and well being        demonstrate ongoing self health care management ability for effective management of Chronic conditions  as evidenced by  working  through Ambulance person with Consulting civil engineer, provider, and care team.   Interventions: 1:1 collaboration with primary care provider regarding development and update of comprehensive plan of care as evidenced by provider attestation and co-signature Inter-disciplinary care team collaboration (see longitudinal plan of care) Evaluation of current treatment plan related to  self management and patient's adherence to plan as established by provider   Chronic Kidney Disease (Status: Goal on Track (progressing): YES.)  Long Term Goal  Last practice recorded BP readings:  BP Readings from Last 3 Encounters:  06/06/21 129/84  05/13/21 (!) 128/92  03/20/21 138/84  Most recent eGFR/CrCl: No results found for: EGFR  No components found for: CRCL  Assessed the patient     understanding of chronic kidney disease    Evaluation of current treatment plan related to chronic kidney disease self management and patient's adherence to plan as established by provider. 08-08-2021: The patient is compliant with the plan of care for his CKD and denies any new changes at this time in his  condition     Provided education to patient re: stroke prevention, s/s of heart attack and stroke    Reviewed prescribed diet heart healthy. 08-08-2021: The patient is mindful of his dietary restrictions and monitors his dietary intake Reviewed medications with patient and discussed importance of compliance. 08-08-2021: The patient is compliant with his medications   Advised patient, providing education and rationale, to monitor blood pressure daily and record, calling PCP for findings outside established parameters    Discussed complications of poorly controlled blood pressure such as heart disease, stroke, circulatory complications, vision complications, kidney impairment, sexual dysfunction    Reviewed scheduled/upcoming provider appointments including: No upcoming appointments, knows to call the office for changes.   Discussed plans with patient for ongoing care management follow up and provided patient with direct contact information for care management team    Screening for signs and symptoms of depression related to chronic disease state      Discussed the impact of chronic kidney disease on daily life and mental health and acknowledged and normalized feelings of disempowerment, fear, and frustration    Assessed social determinant of health barriers    Provided education on kidney disease  progression    Engage patient in early, proactive and ongoing discussion about goals of care and what matters most to them    Support coping and stress management by recognizing current strategies and assist in developing new strategies such as mindfulness, journaling, relaxation techniques, problem-solving      Hyperlipidemia:  (Status: Goal on Track (progressing): YES.) Long Term Goal  Lab Results  Component Value Date   CHOL 144 11/16/2020   HDL 48 11/16/2020   LDLCALC 76 11/16/2020   TRIG 110 11/16/2020   CHOLHDL 3.0 11/16/2020     Medication review performed; medication list updated in electronic  medical record. 08-08-2021: The patient takes Lipitor 10 mg daily for control of his cholesterol Provider established cholesterol goals reviewed. 08-08-2021: The patient is at goal with his cholesterol levels; Counseled on importance of regular laboratory monitoring as prescribed. 08-08-2021: The patient has regular lab testing to check lipid panel Provided HLD educational materials; Reviewed role and benefits of statin for ASCVD risk reduction; Discussed strategies to manage statin-induced myalgias; Reviewed importance of limiting foods high in cholesterol; Screening for signs and symptoms of depression related to chronic disease state;  Assessed social determinant of health barriers;   Hypertension: (Status: Goal on Track (progressing): YES.) Last practice recorded BP readings:  BP Readings from Last 3 Encounters:  06/06/21 129/84  05/13/21 (!) 128/92  03/20/21 138/84  Most recent eGFR/CrCl: No results found for: EGFR  No components found for: CRCL  Evaluation of current treatment plan related to hypertension self management and patient's adherence to plan as established by provider. 08-08-2021: The patient has good control of his HTN and denies any new concerns with HTN or heart health   Provided education to patient re: stroke prevention, s/s of heart attack and stroke; Reviewed prescribed diet heart healthy diet Reviewed medications with patient and discussed importance of compliance. 08-08-2021: The patient is compliant with medications;  Discussed plans with patient for ongoing care management follow up and provided patient with direct contact information for care management team; Advised patient, providing education and rationale, to monitor blood pressure daily and record, calling PCP for findings outside established parameters;  Provided education on prescribed diet heart healthy diet ;  Discussed complications of poorly controlled blood pressure such as heart disease, stroke,  circulatory complications, vision complications, kidney impairment, sexual dysfunction;   Pain:  (Status: Goal Met.) Long Term Goal 08-08-2021: The patient denies any pain and feels his shoulders are doing much better. Closing this goal right now. Will monitor for changes.  Pain assessment performed. 06-06-2021: The patient denies any pain this morning. States it comes and goes. It is more intense when he works a lot and does things where he is raising his arms and hands. States it varies daily but does not wake him from his sleep. Will continue to monitor for changes and needs.  Medications reviewed Reviewed provider established plan for pain management; Discussed importance of adherence to all scheduled medical appointments; Counseled on the importance of reporting any/all new or changed pain symptoms or management strategies to pain management provider; Advised patient to report to care team affect of pain on daily activities; Discussed use of relaxation techniques and/or diversional activities to assist with pain reduction (distraction, imagery, relaxation, massage, acupressure, TENS, heat, and cold application; Reviewed with patient prescribed pharmacological and nonpharmacological pain relief strategies; Advised patient to discuss unresolved pain, changes in level or intensity of pain with provider;  Patient Goals/Self-Care Activities: Take medications as prescribed  Attend all scheduled provider appointments Call pharmacy for medication refills 3-7 days in advance of running out of medications Attend church or other social activities Perform all self care activities independently  Perform IADL's (shopping, preparing meals, housekeeping, managing finances) independently Call provider office for new concerns or questions  Work with the social worker to address care coordination needs and will continue to work with the clinical team to address health care and disease management related  needs call the Suicide and Crisis Lifeline: 988 call the [place] National Suicide Prevention Lifeline: 8607880431 or TTY: 801-041-9002 TTY 458 746 6531) to talk to a trained counselor call 1-800-273-TALK (toll free, 24 hour hotline) if experiencing a Mental Health or [place]  check blood pressure daily choose a place to take my blood pressure (home, clinic or office, retail store) write blood pressure results in a log or diary learn about high blood pressure keep a blood pressure log take blood pressure log to all doctor appointments call doctor for signs and symptoms of high blood pressure develop an action plan for high blood pressure keep all doctor appointments take medications for blood pressure exactly as prescribed report new symptoms to your doctor eat more whole grains, fruits and vegetables, lean meats and healthy fats - call for medicine refill 2 or 3 days before it runs out - take all medications exactly as prescribed - call doctor with any symptoms you believe are related to your medicine - call doctor when you experience any new symptoms - go to all doctor appointments as scheduled - adhere to prescribed diet: heart healthy diet       Plan:Telephone follow up appointment with care management team member scheduled for:  10-31-2021 at 0900 am  [place], MSN, [place] Mobile: [phone]

## 2021-08-13 DIAGNOSIS — N182 Chronic kidney disease, stage 2 (mild): Secondary | ICD-10-CM | POA: Diagnosis not present

## 2021-08-13 DIAGNOSIS — I10 Essential (primary) hypertension: Secondary | ICD-10-CM

## 2021-08-13 DIAGNOSIS — E7849 Other hyperlipidemia: Secondary | ICD-10-CM

## 2021-08-20 DIAGNOSIS — H2513 Age-related nuclear cataract, bilateral: Secondary | ICD-10-CM | POA: Diagnosis not present

## 2021-08-20 DIAGNOSIS — H04123 Dry eye syndrome of bilateral lacrimal glands: Secondary | ICD-10-CM | POA: Diagnosis not present

## 2021-08-20 DIAGNOSIS — H40023 Open angle with borderline findings, high risk, bilateral: Secondary | ICD-10-CM | POA: Diagnosis not present

## 2021-10-04 ENCOUNTER — Ambulatory Visit (INDEPENDENT_AMBULATORY_CARE_PROVIDER_SITE_OTHER): Payer: No Typology Code available for payment source

## 2021-10-04 VITALS — BP 132/78 | HR 49 | Ht 74.0 in | Wt 176.6 lb

## 2021-10-04 DIAGNOSIS — Z0000 Encounter for general adult medical examination without abnormal findings: Secondary | ICD-10-CM | POA: Diagnosis not present

## 2021-10-04 NOTE — Patient Instructions (Signed)
Mr. Richison , ?Thank you for taking time to come for your Medicare Wellness Visit. I appreciate your ongoing commitment to your health goals. Please review the following plan we discussed and let me know if I can assist you in the future.  ? ?Screening recommendations/referrals: ?Colonoscopy: 05/13/21 ?Recommended yearly ophthalmology/optometry visit for glaucoma screening and checkup ?Recommended yearly dental visit for hygiene and checkup ? ?Vaccinations: ?Influenza vaccine: 03/20/21 ?Pneumococcal vaccine: 05/01/16 ?Tdap vaccine: 06/17/07 ?Shingles vaccine: Zostavax 06/16/13   ?Covid-19: 07/18/19, 08/08/19, 04/04/20, 11/19/20, 04/02/21 ? ?Advanced directives: no ? ?Conditions/risks identified: none ? ?Next appointment: Follow up in one year for your annual wellness visit. 10/10/22 @ 8:15am in person ? ?Preventive Care 73 Years and Older, Male ?Preventive care refers to lifestyle choices and visits with your health care provider that can promote health and wellness. ?What does preventive care include? ?A yearly physical exam. This is also called an annual well check. ?Dental exams once or twice a year. ?Routine eye exams. Ask your health care provider how often you should have your eyes checked. ?Personal lifestyle choices, including: ?Daily care of your teeth and gums. ?Regular physical activity. ?Eating a healthy diet. ?Avoiding tobacco and drug use. ?Limiting alcohol use. ?Practicing safe sex. ?Taking low doses of aspirin every day. ?Taking vitamin and mineral supplements as recommended by your health care provider. ?What happens during an annual well check? ?The services and screenings done by your health care provider during your annual well check will depend on your age, overall health, lifestyle risk factors, and family history of disease. ?Counseling  ?Your health care provider may ask you questions about your: ?Alcohol use. ?Tobacco use. ?Drug use. ?Emotional well-being. ?Home and relationship well-being. ?Sexual  activity. ?Eating habits. ?History of falls. ?Memory and ability to understand (cognition). ?Work and work Statistician. ?Screening  ?You may have the following tests or measurements: ?Height, weight, and BMI. ?Blood pressure. ?Lipid and cholesterol levels. These may be checked every 5 years, or more frequently if you are over 37 years old. ?Skin check. ?Lung cancer screening. You may have this screening every year starting at age 71 if you have a 30-pack-year history of smoking and currently smoke or have quit within the past 15 years. ?Fecal occult blood test (FOBT) of the stool. You may have this test every year starting at age 17. ?Flexible sigmoidoscopy or colonoscopy. You may have a sigmoidoscopy every 5 years or a colonoscopy every 10 years starting at age 38. ?Prostate cancer screening. Recommendations will vary depending on your family history and other risks. ?Hepatitis C blood test. ?Hepatitis B blood test. ?Sexually transmitted disease (STD) testing. ?Diabetes screening. This is done by checking your blood sugar (glucose) after you have not eaten for a while (fasting). You may have this done every 1-3 years. ?Abdominal aortic aneurysm (AAA) screening. You may need this if you are a current or former smoker. ?Osteoporosis. You may be screened starting at age 33 if you are at high risk. ?Talk with your health care provider about your test results, treatment options, and if necessary, the need for more tests. ?Vaccines  ?Your health care provider may recommend certain vaccines, such as: ?Influenza vaccine. This is recommended every year. ?Tetanus, diphtheria, and acellular pertussis (Tdap, Td) vaccine. You may need a Td booster every 10 years. ?Zoster vaccine. You may need this after age 49. ?Pneumococcal 13-valent conjugate (PCV13) vaccine. One dose is recommended after age 19. ?Pneumococcal polysaccharide (PPSV23) vaccine. One dose is recommended after age 37. ?Talk to your  health care provider about which  screenings and vaccines you need and how often you need them. ?This information is not intended to replace advice given to you by your health care provider. Make sure you discuss any questions you have with your health care provider. ?Document Released: 06/29/2015 Document Revised: 02/20/2016 Document Reviewed: 04/03/2015 ?Elsevier Interactive Patient Education ? 2017 [place]. ? ?Fall Prevention in the Home ?Falls can cause injuries. They can happen to people of all ages. There are many things you can do to make your home safe and to help prevent falls. ?What can I do on the outside of my home? ?Regularly fix the edges of walkways and driveways and fix any cracks. ?Remove anything that might make you trip as you walk through a door, such as a raised step or threshold. ?Trim any bushes or trees on the path to your home. ?Use bright outdoor lighting. ?Clear any walking paths of anything that might make someone trip, such as rocks or tools. ?Regularly check to see if handrails are loose or broken. Make sure that both sides of any steps have handrails. ?Any raised decks and porches should have guardrails on the edges. ?Have any leaves, snow, or ice cleared regularly. ?Use sand or salt on walking paths during winter. ?Clean up any spills in your garage right away. This includes oil or grease spills. ?What can I do in the bathroom? ?Use night lights. ?Install grab bars by the toilet and in the tub and shower. Do not use towel bars as grab bars. ?Use non-skid mats or decals in the tub or shower. ?If you need to sit down in the shower, use a plastic, non-slip stool. ?Keep the floor dry. Clean up any water that spills on the floor as soon as it happens. ?Remove soap buildup in the tub or shower regularly. ?Attach bath mats securely with double-sided non-slip rug tape. ?Do not have throw rugs and other things on the floor that can make you trip. ?What can I do in the bedroom? ?Use night lights. ?Make sure that you have a  light by your bed that is easy to reach. ?Do not use any sheets or blankets that are too big for your bed. They should not hang down onto the floor. ?Have a firm chair that has side arms. You can use this for support while you get dressed. ?Do not have throw rugs and other things on the floor that can make you trip. ?What can I do in the kitchen? ?Clean up any spills right away. ?Avoid walking on wet floors. ?Keep items that you use a lot in easy-to-reach places. ?If you need to reach something above you, use a strong step stool that has a grab bar. ?Keep electrical cords out of the way. ?Do not use floor polish or wax that makes floors slippery. If you must use wax, use non-skid floor wax. ?Do not have throw rugs and other things on the floor that can make you trip. ?What can I do with my stairs? ?Do not leave any items on the stairs. ?Make sure that there are handrails on both sides of the stairs and use them. Fix handrails that are broken or loose. Make sure that handrails are as long as the stairways. ?Check any carpeting to make sure that it is firmly attached to the stairs. Fix any carpet that is loose or worn. ?Avoid having throw rugs at the top or bottom of the stairs. If you do have throw rugs, attach them  to the floor with carpet tape. ?Make sure that you have a light switch at the top of the stairs and the bottom of the stairs. If you do not have them, ask someone to add them for you. ?What else can I do to help prevent falls? ?Wear shoes that: ?Do not have high heels. ?Have rubber bottoms. ?Are comfortable and fit you well. ?Are closed at the toe. Do not wear sandals. ?If you use a stepladder: ?Make sure that it is fully opened. Do not climb a closed stepladder. ?Make sure that both sides of the stepladder are locked into place. ?Ask someone to hold it for you, if possible. ?Clearly mark and make sure that you can see: ?Any grab bars or handrails. ?First and last steps. ?Where the edge of each step  is. ?Use tools that help you move around (mobility aids) if they are needed. These include: ?Canes. ?Walkers. ?Scooters. ?Crutches. ?Turn on the lights when you go into a dark area. Replace any light bulbs as

## 2021-10-04 NOTE — Progress Notes (Signed)
? ?Subjective:  ? Jesse Figueroa is a 70 y.o. male who presents for Medicare Annual/Subsequent preventive examination. ? ?Review of Systems    ? ?  ? ?   ?Objective:  ?  ?Today's Vitals  ? 10/04/21 0807  ?BP: 132/78  ?Pulse: (!) 49  ?SpO2: 100%  ?Weight: 176 lb 9.6 oz (80.1 kg)  ?Height: '6\' 2"'$  (1.88 m)  ? ?Body mass index is 22.67 kg/m?. ? ? ?  05/13/2021  ?  6:58 AM 10/02/2020  ?  9:00 AM 02/08/2019  ?  3:47 PM 09/15/2017  ? 10:16 AM 04/09/2015  ? 10:14 AM  ?Advanced Directives  ?Does Patient Have a Medical Advance Directive? Yes Yes Yes Yes Yes  ?Type of Corporate treasurer of [place];Living will Living will;Healthcare Power of Attorney Living will Living will  ?Copy of [place] in Chart?  No - copy requested No - copy requested  No - copy requested  ? ? ?Current Medications (verified) ?Outpatient Encounter Medications as of 10/04/2021  ?Medication Sig  ? aspirin 81 MG chewable tablet Chew by mouth.  ? atorvastatin (LIPITOR) 10 MG tablet Take 1 tablet (10 mg total) by mouth daily.  ? baclofen (LIORESAL) 10 MG tablet Take 0.5-1 tablets (5-10 mg total) by mouth 3 (three) times daily as needed for muscle spasms. (Patient not taking: Reported on 05/13/2021)  ? calcium carbonate (TUMS - DOSED IN MG ELEMENTAL CALCIUM) 500 MG chewable tablet Chew 1 tablet by mouth daily as needed for indigestion or heartburn.  ? diclofenac Sodium (VOLTAREN) 1 % GEL Apply 2 g topically 4 (four) times daily as needed (joint pain arthritis shoulder).  ? fluticasone (FLONASE) 50 MCG/ACT nasal spray USE 2 SPRAYS IN BOTH NOSTRILS DAILY. USE FOR 4 TO 6 WEEKS THEN STOP AND USE SEASONALLY OR AS NEEDED.  ? losartan (COZAAR) 100 MG tablet Take 1 tablet (100 mg total) by mouth daily. (Patient taking differently: Take 50 mg by mouth daily.)  ? propranolol (INDERAL) 20 MG tablet Take 1 tablet (20 mg total) by mouth 2 (two) times daily.  ? traZODone (DESYREL) 50 MG tablet Take 100 mg by mouth at bedtime.  ? ?No  facility-administered encounter medications on file as of 10/04/2021.  ? ? ?Allergies (verified) ?Patient has no known allergies.  ? ?History: ?Past Medical History:  ?Diagnosis Date  ? Age-related nuclear cataract of both eyes 05/11/2018  ? Arthritis of hand   ? Benign hypertension with CKD (chronic kidney disease), stage II   ? CKD (chronic kidney disease), stage II   ? Hyperlipidemia   ? Hypertension   ? Tremor   ? ?Past Surgical History:  ?Procedure Laterality Date  ? COLONOSCOPY N/A 05/13/2021  ? Procedure: COLONOSCOPY;  Surgeon: Lesly Rubenstein, MD;  Location[facility] END[facility]COPY;  Service: Endoscopy;  Laterality: N/A;  ? COLONOSCOPY WITH PROPOFOL N/A 02/05/2016  ? Procedure: COLONOSCOPY WITH PROPOFOL;  Surgeon: Lollie Sails, MD;  Location: [facility] ENDOSCOPY;  Service: Endoscopy;  Laterality: N/A;  ? EYE SURGERY  Lasic  ? ?Family History  ?Problem Relation Age of Onset  ? Heart disease Mother   ? Hypertension Mother   ? Diabetes Mother   ? Cancer Father   ? ?Social History  ? ?Socioeconomic History  ? Marital status: Married  ?  Spouse name: Not on file  ? Number of children: Not on file  ? Years of education: Not on file  ? Highest education level: Not on file  ?Occupational History  ?  Occupation: Psychologist, clinical  ?  Comment: [employer]  ?Tobacco Use  ? Smoking status: Never  ? Smokeless tobacco: Never  ?Vaping Use  ? Vaping Use: Never used  ?Substance and Sexual Activity  ? Alcohol use: No  ? Drug use: No  ? Sexual activity: Yes  ?Other Topics Concern  ? Not on file  ?Social History Narrative  ? Track coach 5 days a week   ? ?Social Determinants of Health  ? ?Financial Resource Strain: Not on file  ?Food Insecurity: No Food Insecurity  ? Worried About Charity fundraiser in the Last Year: Never true  ? Ran Out of Food in the Last Year: Never true  ?Transportation Needs: No Transportation Needs  ? Lack of Transportation (Medical): No  ? Lack of Transportation (Non-Medical): No  ?Physical  Activity: Sufficiently Active  ? Days of Exercise per Week: 5 days  ? Minutes of Exercise per Session: 60 min  ?Stress: No Stress Concern Present  ? Feeling of Stress : Not at all  ?Social Connections: Moderately Integrated  ? Frequency of Communication with Friends and Family: More than three times a week  ? Frequency of Social Gatherings with Friends and Family: More than three times a week  ? Attends Religious Services: Never  ? Active Member of Clubs or Organizations: Yes  ? Attends Archivist Meetings: More than 4 times per year  ? Marital Status: Married  ? ? ?Tobacco Counseling ?Counseling given: Not Answered ? ? ?Clinical Intake: ? ?Pre-visit preparation completed: Yes ? ?Pain : No/denies pain ? ?  ? ?Nutritional Risks: None ? ?How often do you need to have someone help you when you read instructions, pamphlets, or other written materials from your doctor or pharmacy?: 1 - Never ? ?Diabetic?no ? ?Interpreter Needed?: No ?o ?Information entered by :: Kirke Shaggy, LPN ? ? ?Activities of Daily Living ?   ? View : No data to display.  ?  ?  ?  ? ? ?Patient Care Team: ?Olin Hauser, DO as PCP - General (Family Medicine) ?Vanita Ingles, RN as Registered Nurse (General Practice) ?Delles, Virl Diamond, RPH-CPP as Pharmacist ? ?Indicate any recent Medical Services you may have received from other than [facility] providers in the past year (date may be approximate). ? ?   ?Assessment:  ? This is a routine wellness examination for Jesse Figueroa. ? ?Hearing/Vision screen ?No results found. ? ?Dietary issues and exercise activities discussed: ?  ? ? Goals Addressed   ?None ?  ? ?Depression Screen ? ?  03/20/2021  ? 10:43 AM 10/02/2020  ?  9:01 AM 11/25/2019  ?  9:46 AM 10/27/2019  ?  9:55 AM 08/11/2019  ?  8:43 AM 07/07/2019  ?  9:51 AM 03/01/2019  ?  3:51 PM  ?PHQ 2/9 Scores  ?PHQ - 2 Score 0 0 0 0 0 0 0  ?PHQ- 9 Score 2        ?  ?Fall Risk ? ?  03/20/2021  ? 10:41 AM 11/15/2020  ?  8:28 AM 10/02/2020  ?  9:01 AM  11/25/2019  ?  9:46 AM 08/11/2019  ?  8:43 AM  ?Fall Risk   ?Falls in the past year? 0 0 0 0 0  ?Number falls in past yr: 0 0  0 0  ?Injury with Fall? 0 0  0 0  ?Risk for fall due to :   Medication side effect    ?Follow  up Falls evaluation completed Falls evaluation completed Falls evaluation completed;Education provided;Falls prevention discussed Falls evaluation completed Falls evaluation completed  ? ? ?FALL RISK PREVENTION PERTAINING TO THE HOME: ? ?Any stairs in or around the home? Yes  ?If so, are there any without handrails? No  ?Home free of loose throw rugs in walkways, pet beds, electrical cords, etc? Yes  ?Adequate lighting in your home to reduce risk of falls? Yes  ? ?ASSISTIVE DEVICES UTILIZED TO PREVENT FALLS: ? ?Life alert? No  ?Use of a cane, walker or w/c? No  ?Grab bars in the bathroom? No  ?Shower chair or bench in shower? No  ?Elevated toilet seat or a handicapped toilet? No  ? ?TIMED UP AND GO: ? ?Was the test performed? Yes .  ?Length of time to ambulate 10 feet: 4 sec.  ? ?Gait steady and fast without use of assistive device ? ?Cognitive Function: ?  ?  ? ?  10/02/2020  ?  9:02 AM 02/08/2019  ?  3:52 PM 09/15/2017  ? 10:20 AM  ?6CIT Screen  ?What Year? 0 points 0 points 0 points  ?What month? 0 points 0 points 0 points  ?What time? 0 points 0 points 0 points  ?Count back from 20 0 points 0 points 0 points  ?Months in reverse 0 points 0 points 0 points  ?Repeat phrase 2 points 0 points 2 points  ?Total Score 2 points 0 points 2 points  ? ? ?Immunizations ?Immunization History  ?Administered Date(s) Administered  ? Fluad Quad(high Dose 65+) 02/08/2019, 03/27/2020, 03/20/2021  ? Influenza, High Dose Seasonal PF 03/06/2016, 06/17/2017  ? Influenza,inj,quad, With Preservative 03/16/2016  ? Influenza-Unspecified 03/16/2013, 04/07/2015, 02/04/2018  ? PFIZER(Purple Top)SARS-COV-2 Vaccination 07/18/2019, 08/08/2019, 04/04/2020, 11/19/2020  ? Pension scheme manager 69yr & up 04/02/2021  ?  Pneumococcal Conjugate-13 04/09/2015  ? Pneumococcal Polysaccharide-23 05/01/2016  ? Pneumococcal-Unspecified 03/16/2016  ? Tdap 06/17/2007  ? Zoster, Live 06/16/2013  ? ? ?TDAP status: Due, Education has been provi

## 2021-10-08 ENCOUNTER — Ambulatory Visit: Payer: Medicare HMO

## 2021-10-08 ENCOUNTER — Encounter: Payer: Self-pay | Admitting: Family Medicine

## 2021-10-08 ENCOUNTER — Ambulatory Visit (INDEPENDENT_AMBULATORY_CARE_PROVIDER_SITE_OTHER): Payer: No Typology Code available for payment source | Admitting: Family Medicine

## 2021-10-08 VITALS — BP 126/84 | HR 53 | Ht 74.0 in | Wt 179.0 lb

## 2021-10-08 DIAGNOSIS — L821 Other seborrheic keratosis: Secondary | ICD-10-CM | POA: Diagnosis not present

## 2021-10-08 NOTE — Patient Instructions (Addendum)
Thank you for coming to the office today. ? ?Seborrheic keratosis (seb-o-REE-ik care-uh-TOE-sis) is a common skin growth. It may seem worrisome because it can look like a wart, pre-cancerous skin growth (actinic keratosis), or skin cancer. Despite their appearance, seborrheic keratoses are harmless. ?Most people get these growths when they are middle aged or older. Because they begin at a later age and can have a wart-like appearance, seborrheic keratoses are often called the ?barnacles of aging.? ?It?s possible to have just one of these growths, but most people develop several. Some growths may have a warty surface while others look like dabs of warm, brown candle wax on the skin. ?Seborrheic keratoses range in color from white to black; however, most are tan or brown. ?You can find these harmless growths anywhere on the skin, except the palms and soles. Most often, you?ll see them on the chest, back, head, or neck. ? ?------------------------------------------- ? ?[facility] ?Dermatologist in [place], [place] ?Address: [address] [place], [place] [address] ?Phone: [phone] ? ?[place]   ?[place] ?[place], [place] [address] ?Hours: 8AM-5PM ?Phone: [phone] ? ?[facility] ?[address] ?[place],  [address] ?Phone: [phone] ? ? ?Please schedule a Follow-up Appointment to: Return if symptoms worsen or fail to improve. ? ?If you have any other questions or concerns, please feel free to call the office or send a message through [place]. You may also schedule an earlier appointment if necessary. ? ?Additionally, you may be receiving a survey about your experience at our office within a few days to 1 week by e-mail or mail. We value your feedback. ? ?[name], DO ?[place] ?

## 2021-10-08 NOTE — Progress Notes (Signed)
? ?Subjective:  ? ? [name], male    DOB: [birth year], 71 y.o.   MRN: [id number] ? ?Jesse Figueroa is a 71 y.o. male presenting on 10/08/2021 for bump on head ? ? ?HPI ? ?Seborrheic Keratosis ?Spot on back of head scalp, present for 1-2 months onset, it was itching some and he found it, now improved. It was never sore or painful. No other spots identified. No injury or trauma. ?Does not have dermatologist ? ? ? ?  10/04/2021  ?  8:11 AM 03/20/2021  ? 10:43 AM 10/02/2020  ?  9:01 AM  ?Depression screen PHQ 2/9  ?Decreased Interest 0 0 0  ?Down, Depressed, Hopeless 0 0 0  ?PHQ - 2 Score 0 0 0  ?Altered sleeping  2   ?Tired, decreased energy  0   ?Change in appetite  0   ?Feeling bad or failure about yourself   0   ?Trouble concentrating  0   ?Moving slowly or fidgety/restless  0   ?Suicidal thoughts  0   ?PHQ-9 Score  2   ?Difficult doing work/chores  Not difficult at all   ? ? ?Social History  ? ?Tobacco Use  ? Smoking status: Never  ? Smokeless tobacco: Never  ?Vaping Use  ? Vaping Use: Never used  ?Substance Use Topics  ? Alcohol use: No  ? Drug use: No  ? ? ?Review of Systems ?Per HPI unless specifically indicated above ? ?   ?Objective:  ?  ?BP 126/84   Pulse (!) 53   Ht '6\' 2"'$  (1.88 m)   Wt 179 lb (81.2 kg)   SpO2 100%   BMI 22.98 kg/m?   ?Wt Readings from Last 3 Encounters:  ?10/08/21 179 lb (81.2 kg)  ?10/04/21 176 lb 9.6 oz (80.1 kg)  ?05/13/21 171 lb 5.5 oz (77.7 kg)  ?  ?Physical Exam ?Vitals and nursing note reviewed.  ?Constitutional:   ?   General: He is not in acute distress. ?   Appearance: Normal appearance. He is well-developed. He is not diaphoretic.  ?   Comments: Well-appearing, comfortable, cooperative  ?HENT:  ?   Head: Normocephalic and atraumatic.  ?Eyes:  ?   General:     ?   Right eye: No discharge.     ?   Left eye: No discharge.  ?   Conjunctiva/sclera: Conjunctivae normal.  ?Cardiovascular:  ?   Rate and Rhythm: Normal rate.  ?Pulmonary:  ?   Effort: Pulmonary effort is  normal.  ?Skin: ?   General: Skin is warm and dry.  ?   Findings: Lesion (1 x 1 cm raised scaly seborrheic keratosis back of scalp) present. No erythema or rash.  ?Neurological:  ?   Mental Status: He is alert and oriented to person, place, and time.  ?Psychiatric:     ?   Mood and Affect: Mood normal.     ?   Behavior: Behavior normal.     ?   Thought Content: Thought content normal.  ?   Comments: Well groomed, good eye contact, normal speech and thoughts  ? ? ?Results for orders placed or performed during the hospital encounter of 05/13/21  ?Surgical pathology  ?Result Value Ref Range  ? SURGICAL PATHOLOGY    ?  SURGICAL PATHOLOGY ?CASE: [id number] ?PATIENT: Jesse Figueroa ?Surgical Pathology Report ? ? ? ? ?Specimen Submitted: ?A. Colon polyp, ascending; cold snare ? ?Clinical History: Z83.71 family history of polyps in the colon.  Colon ?polyp, internal hemorrhoids ? ? ? ? ? ?DIAGNOSIS: ?A.  COLON POLYP, ASCENDING; COLD SNARE: ?- TUBULAR ADENOMA. ?- NEGATIVE FOR HIGH-GRADE DYSPLASIA AND MALIGNANCY. ? ?GROSS DESCRIPTION: ?A. Labeled: Cold snared ascending colon polyp ?Received: [facility] ?Collection time: 7:27 AM on 05/13/2021 ?Placed into formalin time: 7:27 AM on 05/13/2021 ?Tissue fragment(s): Multiple ?Size: Aggregate, 0.5 x 0.4 x 0.2 cm ?Description: Tan soft tissue fragments ?Entirely submitted in 1 cassette. ? ?RB 05/13/2021 ? ? ?Final Diagnosis performed by Bryan Lemma, MD.   Electronically signed ?05/15/2021 2:21:50PM ?The electronic signature indicates that the named Attending Pathologist ?has evaluated the specimen ?Technical component performed at [facility], [employer], [place], ?[place] [address] Lab: [phone] Dir: Rush Farmer, MD, MMM ? Professional component performed at [facility], [facility], [place], [place], [place] [address] Lab: [phone] ?Dir: Kathi Simpers, MD ?  ? ?   ?Assessment & Plan:  ? ?Problem List Items Addressed This Visit   ?None ?Visit  Diagnoses   ? ? SK (seborrheic keratosis)    -  Primary  ? ?  ?  ?Benign appearing localized SK on back of scalp. ?No evidence of complication ?Reassurance today ?Consider refer to Derm if desire to have it treated. Consider Cryotherapy. ? ?No orders of the defined types were placed in this encounter. ? ? ? ? ?Follow up plan: ?Return if symptoms worsen or fail to improve. ? ? ?[name], DO ?[facility] ?[facility] ?10/08/2021, 9:29 AM ?

## 2021-10-30 DIAGNOSIS — Z0100 Encounter for examination of eyes and vision without abnormal findings: Secondary | ICD-10-CM | POA: Diagnosis not present

## 2021-10-30 DIAGNOSIS — H40013 Open angle with borderline findings, low risk, bilateral: Secondary | ICD-10-CM | POA: Diagnosis not present

## 2021-10-30 DIAGNOSIS — H5203 Hypermetropia, bilateral: Secondary | ICD-10-CM | POA: Diagnosis not present

## 2021-10-30 DIAGNOSIS — E119 Type 2 diabetes mellitus without complications: Secondary | ICD-10-CM | POA: Diagnosis not present

## 2021-10-31 ENCOUNTER — Telehealth: Payer: Self-pay

## 2021-10-31 ENCOUNTER — Telehealth: Payer: No Typology Code available for payment source

## 2021-10-31 NOTE — Telephone Encounter (Signed)
  Care Management   Follow Up Note   10/31/2021 Name: Jesse Figueroa MRN: [id number] DOB: 1950-06-30   Referred by: Olin Hauser, DO Reason for referral : Chronic Care Management (RNCM: Follow up for Chronic Disease Management and Care Coordination Needs)   An unsuccessful telephone outreach was attempted today. The patient was referred to the case management team for assistance with care management and care coordination.   Follow Up Plan: A HIPPA compliant phone message was left for the patient providing contact information and requesting a return call.   Noreene Larsson RN, MSN, [place] Mobile: [phone]

## 2021-11-14 DEATH — deceased

## 2021-12-12 ENCOUNTER — Ambulatory Visit (INDEPENDENT_AMBULATORY_CARE_PROVIDER_SITE_OTHER): Payer: No Typology Code available for payment source

## 2021-12-12 ENCOUNTER — Telehealth: Payer: No Typology Code available for payment source

## 2021-12-12 DIAGNOSIS — E7849 Other hyperlipidemia: Secondary | ICD-10-CM

## 2021-12-12 DIAGNOSIS — I10 Essential (primary) hypertension: Secondary | ICD-10-CM

## 2021-12-12 DIAGNOSIS — N182 Chronic kidney disease, stage 2 (mild): Secondary | ICD-10-CM

## 2021-12-12 DIAGNOSIS — G8929 Other chronic pain: Secondary | ICD-10-CM

## 2021-12-12 NOTE — Chronic Care Management (AMB) (Signed)
Chronic Care Management   CCM RN Visit Note  12/12/2021 Name: Jesse Figueroa MRN: [id number] DOB: 11-23-1950  Subjective: Jesse Figueroa is a 71 y.o. year old male who is a primary care patient of Olin Hauser, DO. The care management team was consulted for assistance with disease management and care coordination needs.    Engaged with patient by telephone for follow up visit in response to provider referral for case management and/or care coordination services.   Consent to Services:  The patient was given information about Chronic Care Management services, agreed to services, and gave verbal consent prior to initiation of services.  Please see initial visit note for detailed documentation.   Patient agreed to services and verbal consent obtained.   Assessment: Review of patient past medical history, allergies, medications, health status, including review of consultants reports, laboratory and other test data, was performed as part of comprehensive evaluation and provision of chronic care management services.   SDOH (Social Determinants of Health) assessments and interventions performed:  SDOH Interventions    Flowsheet Row Most Recent Value  SDOH Interventions   Social Connections Interventions Intervention Not Indicated        CCM Care Plan  No Known Allergies  Outpatient Encounter Medications as of 12/12/2021  Medication Sig Note   aspirin 81 MG chewable tablet Chew by mouth.    atorvastatin (LIPITOR) 10 MG tablet Take 1 tablet (10 mg total) by mouth daily.    baclofen (LIORESAL) 10 MG tablet Take 0.5-1 tablets (5-10 mg total) by mouth 3 (three) times daily as needed for muscle spasms.    calcium carbonate (TUMS - DOSED IN MG ELEMENTAL CALCIUM) 500 MG chewable tablet Chew 1 tablet by mouth daily as needed for indigestion or heartburn. 04/20/2020: Reports using ~ once/week   diclofenac Sodium (VOLTAREN) 1 % GEL Apply 2 g topically 4 (four) times daily as needed  (joint pain arthritis shoulder).    fluticasone (FLONASE) 50 MCG/ACT nasal spray USE 2 SPRAYS IN BOTH NOSTRILS DAILY. USE FOR 4 TO 6 WEEKS THEN STOP AND USE SEASONALLY OR AS NEEDED.    losartan (COZAAR) 100 MG tablet Take 1 tablet (100 mg total) by mouth daily. (Patient taking differently: Take 50 mg by mouth daily.)    propranolol (INDERAL) 20 MG tablet Take 1 tablet (20 mg total) by mouth 2 (two) times daily.    traZODone (DESYREL) 50 MG tablet Take 100 mg by mouth at bedtime. (Patient not taking: Reported on 10/04/2021)    No facility-administered encounter medications on file as of 12/12/2021.    Patient Active Problem List   Diagnosis Date Noted   Rash and nonspecific skin eruption 03/27/2020   Influenza vaccine needed 03/27/2020   Cyst of right kidney 04/18/2019   Age-related nuclear cataract of both eyes 05/11/2018   Disorder of refraction 05/11/2018   Dry eye syndrome of both eyes 05/11/2018   Open angle with borderline findings and high glaucoma risk in both eyes 05/11/2018   CKD (chronic kidney disease), stage II 09/21/2017   Arthritis of hand 05/01/2016   Bradycardia 12/04/2015   Pre-diabetes 10/15/2015   Hyperlipidemia 07/09/2015   Benign hypertension with CKD (chronic kidney disease), stage II 04/09/2015   Insomnia 10/03/2014   Essential tremor 10/03/2014    Conditions to be addressed/monitored:HTN, HLD, CKD Stage 2, and chronic pain  Care Plan : RNCM: General Plan of Care (Adult) for Chronic Disease Management and Care Coordination Needs  Updates made by Vanita Ingles, RN  since 12/12/2021 12:00 AM     Problem: RNCM: Development of Plan of Care for Chronic Disease Management (HTN, HLD, CKD2, Chronic Pain)   Priority: High     Long-Range Goal: RNCM: Effective Management of Plan of Care for Chronic Disease Management (HTN, HLD, CKD2, Chronic Pain) Completed 12/12/2021  Start Date: 06/06/2021  Expected End Date: 06/06/2022  Priority: High  Note:   Current Barriers:  12-12-2021: Goals met and care plan is being closed. The patient has met the goals of care. The only complaint he has today is some numbness in his toes, especially on the left foot after sitting for a while. The patient states that he does not know what is going on with this. Education on checking feet daily with putting a mirror on the floor and looking at the bottoms of feet for open areas or cuts. Also to elevate feet when sitting, using compression socks to help with circulation and reporting changes to the pcp for acute changes, questions or needs. The patient verbalized understanding.   Knowledge Deficits related to plan of care for management of HTN, HLD, CKD Stage 2, and chronic pain  Chronic Disease Management support and education needs related to HTN, HLD, CKD Stage 2, and chronic pain   RNCM Clinical Goal(s):  Patient will verbalize basic understanding of HTN, HLD, CKD Stage 2, and Chronic pain  disease process and self health management plan as evidenced by keeping appointments, following the plan of care, and working with the pcp and CCM team to effectively manage health and well being  take all medications exactly as prescribed and will call provider for medication related questions as evidenced by compliance with medications and calling the refill needs before running out of medications     attend all scheduled medical appointments: no upcoming with the pcp but states he will call if needed  as evidenced by keeping appointments and calling for schedule change needs        demonstrate improved and ongoing health management independence as evidenced by stable conditions, VS WNL, regular lab work and working with the CCM team to optimize health and well being        demonstrate ongoing self health care management ability for effective management of Chronic conditions  as evidenced by  working  through Ambulance person with Consulting civil engineer, provider, and care team.   Interventions: 1:1  collaboration with primary care provider regarding development and update of comprehensive plan of care as evidenced by provider attestation and co-signature Inter-disciplinary care team collaboration (see longitudinal plan of care) Evaluation of current treatment plan related to  self management and patient's adherence to plan as established by provider   Chronic Kidney Disease (Status: Goal Met.)  Long Term Goal 12-12-2021: Goals met and care plan is being closed  Last practice recorded BP readings:  BP Readings from Last 3 Encounters:  10/08/21 126/84  10/04/21 132/78  06/06/21 129/84  Most recent eGFR/CrCl: No results found for: "EGFR"  No components found for: CRCL  Assessed the patient     understanding of chronic kidney disease    Evaluation of current treatment plan related to chronic kidney disease self management and patient's adherence to plan as established by provider. 08-08-2021: The patient is compliant with the plan of care for his CKD and denies any new changes at this time in his condition     Provided education to patient re: stroke prevention, s/s of heart attack and stroke    Reviewed prescribed  diet heart healthy. 08-08-2021: The patient is mindful of his dietary restrictions and monitors his dietary intake Reviewed medications with patient and discussed importance of compliance. 08-08-2021: The patient is compliant with his medications   Advised patient, providing education and rationale, to monitor blood pressure daily and record, calling PCP for findings outside established parameters    Discussed complications of poorly controlled blood pressure such as heart disease, stroke, circulatory complications, vision complications, kidney impairment, sexual dysfunction    Reviewed scheduled/upcoming provider appointments including: No upcoming appointments, knows to call the office for changes.   Discussed plans with patient for ongoing care management follow up and provided  patient with direct contact information for care management team    Screening for signs and symptoms of depression related to chronic disease state      Discussed the impact of chronic kidney disease on daily life and mental health and acknowledged and normalized feelings of disempowerment, fear, and frustration    Assessed social determinant of health barriers    Provided education on kidney disease progression    Engage patient in early, proactive and ongoing discussion about goals of care and what matters most to them    Support coping and stress management by recognizing current strategies and assist in developing new strategies such as mindfulness, journaling, relaxation techniques, problem-solving      Hyperlipidemia:  (Status: Goal Met.) Long Term Goal 12-12-2021: Goals met and care plan is being closed  Lab Results  Component Value Date   CHOL 144 11/16/2020   HDL 48 11/16/2020   LDLCALC 76 11/16/2020   TRIG 110 11/16/2020   CHOLHDL 3.0 11/16/2020     Medication review performed; medication list updated in electronic medical record. 12-12-2021: The patient takes Lipitor 10 mg daily for control of his cholesterol Provider established cholesterol goals reviewed. 08-08-2021: The patient is at goal with his cholesterol levels; Counseled on importance of regular laboratory monitoring as prescribed. 08-08-2021: The patient has regular lab testing to check lipid panel Provided HLD educational materials; Reviewed role and benefits of statin for ASCVD risk reduction; Discussed strategies to manage statin-induced myalgias; Reviewed importance of limiting foods high in cholesterol; Screening for signs and symptoms of depression related to chronic disease state;  Assessed social determinant of health barriers;   Hypertension: (Status: Goal Met.) 12-12-2021: Goals met and care plan is being closed  Last practice recorded BP readings:  BP Readings from Last 3 Encounters:  10/08/21 126/84   10/04/21 132/78  06/06/21 129/84  Most recent eGFR/CrCl: No results found for: EGFR  No components found for: CRCL  Evaluation of current treatment plan related to hypertension self management and patient's adherence to plan as established by provider. 08-08-2021: The patient has good control of his HTN and denies any new concerns with HTN or heart health   Provided education to patient re: stroke prevention, s/s of heart attack and stroke; Reviewed prescribed diet heart healthy diet Reviewed medications with patient and discussed importance of compliance. 08-08-2021: The patient is compliant with medications;  Discussed plans with patient for ongoing care management follow up and provided patient with direct contact information for care management team; Advised patient, providing education and rationale, to monitor blood pressure daily and record, calling PCP for findings outside established parameters;  Provided education on prescribed diet heart healthy diet ;  Discussed complications of poorly controlled blood pressure such as heart disease, stroke, circulatory complications, vision complications, kidney impairment, sexual dysfunction;   Pain:  (Status: Goal Met.)  Long Term Goal 08-08-2021: The patient denies any pain and feels his shoulders are doing much better. Closing this goal right now. Will monitor for changes.  Pain assessment performed. 06-06-2021: The patient denies any pain this morning. States it comes and goes. It is more intense when he works a lot and does things where he is raising his arms and hands. States it varies daily but does not wake him from his sleep. Will continue to monitor for changes and needs.  Medications reviewed Reviewed provider established plan for pain management; Discussed importance of adherence to all scheduled medical appointments; Counseled on the importance of reporting any/all new or changed pain symptoms or management strategies to pain management  provider; Advised patient to report to care team affect of pain on daily activities; Discussed use of relaxation techniques and/or diversional activities to assist with pain reduction (distraction, imagery, relaxation, massage, acupressure, TENS, heat, and cold application; Reviewed with patient prescribed pharmacological and nonpharmacological pain relief strategies; Advised patient to discuss unresolved pain, changes in level or intensity of pain with provider;  Patient Goals/Self-Care Activities: Take medications as prescribed   Attend all scheduled provider appointments Call pharmacy for medication refills 3-7 days in advance of running out of medications Attend church or other social activities Perform all self care activities independently  Perform IADL's (shopping, preparing meals, housekeeping, managing finances) independently Call provider office for new concerns or questions  Work with the social worker to address care coordination needs and will continue to work with the clinical team to address health care and disease management related needs call the Suicide and Crisis Lifeline: 988 call the [place] National Suicide Prevention Lifeline: 450 761 7485 or TTY: 540-398-1159 TTY (445)202-8374) to talk to a trained counselor call 1-800-273-TALK (toll free, 24 hour hotline) if experiencing a Mental Health or [place]  check blood pressure daily choose a place to take my blood pressure (home, clinic or office, retail store) write blood pressure results in a log or diary learn about high blood pressure keep a blood pressure log take blood pressure log to all doctor appointments call doctor for signs and symptoms of high blood pressure develop an action plan for high blood pressure keep all doctor appointments take medications for blood pressure exactly as prescribed report new symptoms to your doctor eat more whole grains, fruits and vegetables, lean meats and healthy  fats - call for medicine refill 2 or 3 days before it runs out - take all medications exactly as prescribed - call doctor with any symptoms you believe are related to your medicine - call doctor when you experience any new symptoms - go to all doctor appointments as scheduled - adhere to prescribed diet: heart healthy diet       Plan:No further follow up required: the patient has met the goals of care and the care plan has been completed. The patient has the [facility] number to call for changes or new needs.   Noreene Larsson RN, MSN, [place] Mobile: [phone]

## 2021-12-12 NOTE — Patient Instructions (Signed)
Visit Information  Thank you for taking time to visit with me today. Please don't hesitate to contact me if I can be of assistance to you before our next scheduled telephone appointment.  Following are the goals we discussed today:  Long-Range Goal: RNCM: Effective Management of Plan of Care for Chronic Disease Management (HTN, HLD, CKD2, Chronic Pain) Completed 12/12/2021  Start Date: 06/06/2021  Expected End Date: 06/06/2022  Priority: High  Note:   Current Barriers: 12-12-2021: Goals met and care plan is being closed. The patient has met the goals of care. The only complaint he has today is some numbness in his toes, especially on the left foot after sitting for a while. The patient states that he does not know what is going on with this. Education on checking feet daily with putting a mirror on the floor and looking at the bottoms of feet for open areas or cuts. Also to elevate feet when sitting, using compression socks to help with circulation and reporting changes to the pcp for acute changes, questions or needs. The patient verbalized understanding.    Knowledge Deficits related to plan of care for management of HTN, HLD, CKD Stage 2, and chronic pain  Chronic Disease Management support and education needs related to HTN, HLD, CKD Stage 2, and chronic pain    RNCM Clinical Goal(s):  Patient will verbalize basic understanding of HTN, HLD, CKD Stage 2, and Chronic pain  disease process and self health management plan as evidenced by keeping appointments, following the plan of care, and working with the pcp and CCM team to effectively manage health and well being  take all medications exactly as prescribed and will call provider for medication related questions as evidenced by compliance with medications and calling the refill needs before running out of medications     attend all scheduled medical appointments: no upcoming with the pcp but states he will call if needed  as evidenced by keeping  appointments and calling for schedule change needs        demonstrate improved and ongoing health management independence as evidenced by stable conditions, VS WNL, regular lab work and working with the CCM team to optimize health and well being        demonstrate ongoing self health care management ability for effective management of Chronic conditions  as evidenced by  working  through Ambulance person with Consulting civil engineer, provider, and care team.    Interventions: 1:1 collaboration with primary care provider regarding development and update of comprehensive plan of care as evidenced by provider attestation and co-signature Inter-disciplinary care team collaboration (see longitudinal plan of care) Evaluation of current treatment plan related to  self management and patient's adherence to plan as established by provider     Chronic Kidney Disease (Status: Goal Met.)  Long Term Goal 12-12-2021: Goals met and care plan is being closed  Last practice recorded BP readings:     BP Readings from Last 3 Encounters:  10/08/21 126/84  10/04/21 132/78  06/06/21 129/84  Most recent eGFR/CrCl: No results found for: "EGFR"  No components found for: CRCL   Assessed the patient     understanding of chronic kidney disease    Evaluation of current treatment plan related to chronic kidney disease self management and patient's adherence to plan as established by provider. 08-08-2021: The patient is compliant with the plan of care for his CKD and denies any new changes at this time in his condition  Provided education to patient re: stroke prevention, s/s of heart attack and stroke    Reviewed prescribed diet heart healthy. 08-08-2021: The patient is mindful of his dietary restrictions and monitors his dietary intake Reviewed medications with patient and discussed importance of compliance. 08-08-2021: The patient is compliant with his medications   Advised patient, providing education and rationale, to monitor  blood pressure daily and record, calling PCP for findings outside established parameters    Discussed complications of poorly controlled blood pressure such as heart disease, stroke, circulatory complications, vision complications, kidney impairment, sexual dysfunction    Reviewed scheduled/upcoming provider appointments including: No upcoming appointments, knows to call the office for changes.   Discussed plans with patient for ongoing care management follow up and provided patient with direct contact information for care management team    Screening for signs and symptoms of depression related to chronic disease state      Discussed the impact of chronic kidney disease on daily life and mental health and acknowledged and normalized feelings of disempowerment, fear, and frustration    Assessed social determinant of health barriers    Provided education on kidney disease progression    Engage patient in early, proactive and ongoing discussion about goals of care and what matters most to them    Support coping and stress management by recognizing current strategies and assist in developing new strategies such as mindfulness, journaling, relaxation techniques, problem-solving        Hyperlipidemia:  (Status: Goal Met.) Long Term Goal 12-12-2021: Goals met and care plan is being closed       Lab Results  Component Value Date    CHOL 144 11/16/2020    HDL 48 11/16/2020    LDLCALC 76 11/16/2020    TRIG 110 11/16/2020    CHOLHDL 3.0 11/16/2020      Medication review performed; medication list updated in electronic medical record. 12-12-2021: The patient takes Lipitor 10 mg daily for control of his cholesterol Provider established cholesterol goals reviewed. 08-08-2021: The patient is at goal with his cholesterol levels; Counseled on importance of regular laboratory monitoring as prescribed. 08-08-2021: The patient has regular lab testing to check lipid panel Provided HLD educational  materials; Reviewed role and benefits of statin for ASCVD risk reduction; Discussed strategies to manage statin-induced myalgias; Reviewed importance of limiting foods high in cholesterol; Screening for signs and symptoms of depression related to chronic disease state;  Assessed social determinant of health barriers;    Hypertension: (Status: Goal Met.) 12-12-2021: Goals met and care plan is being closed  Last practice recorded BP readings:     BP Readings from Last 3 Encounters:  10/08/21 126/84  10/04/21 132/78  06/06/21 129/84  Most recent eGFR/CrCl: No results found for: EGFR  No components found for: CRCL   Evaluation of current treatment plan related to hypertension self management and patient's adherence to plan as established by provider. 08-08-2021: The patient has good control of his HTN and denies any new concerns with HTN or heart health   Provided education to patient re: stroke prevention, s/s of heart attack and stroke; Reviewed prescribed diet heart healthy diet Reviewed medications with patient and discussed importance of compliance. 08-08-2021: The patient is compliant with medications;  Discussed plans with patient for ongoing care management follow up and provided patient with direct contact information for care management team; Advised patient, providing education and rationale, to monitor blood pressure daily and record, calling PCP for findings outside established parameters;  Provided  education on prescribed diet heart healthy diet ;  Discussed complications of poorly controlled blood pressure such as heart disease, stroke, circulatory complications, vision complications, kidney impairment, sexual dysfunction;    Pain:  (Status: Goal Met.) Long Term Goal 08-08-2021: The patient denies any pain and feels his shoulders are doing much better. Closing this goal right now. Will monitor for changes.  Pain assessment performed. 06-06-2021: The patient denies any pain this  morning. States it comes and goes. It is more intense when he works a lot and does things where he is raising his arms and hands. States it varies daily but does not wake him from his sleep. Will continue to monitor for changes and needs.  Medications reviewed Reviewed provider established plan for pain management; Discussed importance of adherence to all scheduled medical appointments; Counseled on the importance of reporting any/all new or changed pain symptoms or management strategies to pain management provider; Advised patient to report to care team affect of pain on daily activities; Discussed use of relaxation techniques and/or diversional activities to assist with pain reduction (distraction, imagery, relaxation, massage, acupressure, TENS, heat, and cold application; Reviewed with patient prescribed pharmacological and nonpharmacological pain relief strategies; Advised patient to discuss unresolved pain, changes in level or intensity of pain with provider;   Patient Goals/Self-Care Activities: Take medications as prescribed   Attend all scheduled provider appointments Call pharmacy for medication refills 3-7 days in advance of running out of medications Attend church or other social activities Perform all self care activities independently  Perform IADL's (shopping, preparing meals, housekeeping, managing finances) independently Call provider office for new concerns or questions  Work with the social worker to address care coordination needs and will continue to work with the clinical team to address health care and disease management related needs call the Suicide and Crisis Lifeline: 988 call the [place] National Suicide Prevention Lifeline: 289-030-4717 or TTY: (302) 761-7008 TTY (681)119-9276) to talk to a trained counselor call 1-800-273-TALK (toll free, 24 hour hotline) if experiencing a Mental Health or [place]  check blood pressure daily choose a place to take my  blood pressure (home, clinic or office, retail store) write blood pressure results in a log or diary learn about high blood pressure keep a blood pressure log take blood pressure log to all doctor appointments call doctor for signs and symptoms of high blood pressure develop an action plan for high blood pressure keep all doctor appointments take medications for blood pressure exactly as prescribed report new symptoms to your doctor eat more whole grains, fruits and vegetables, lean meats and healthy fats - call for medicine refill 2 or 3 days before it runs out - take all medications exactly as prescribed - call doctor with any symptoms you believe are related to your medicine - call doctor when you experience any new symptoms - go to all doctor appointments as scheduled - adhere to prescribed diet: heart healthy diet          No further follow up required. The patient has met the goals of care. Education on calling if new needs or questions arise.  Please call the care guide team at [phone] if you need to cancel or reschedule your appointment.   If you are experiencing a Mental Health or [place] or need someone to talk to, please call the Suicide and Crisis Lifeline: 988 call the [place] National Suicide Prevention Lifeline: (682) 056-1968 or TTY: 908 870 9858 TTY (743)148-5316) to talk to a trained counselor call  1-800-273-TALK (toll free, 24 hour hotline)   Patient verbalizes understanding of instructions and care plan provided today and agrees to view in [place]. Active MyChart status and patient understanding of how to access instructions and care plan via MyChart confirmed with patient.     Noreene Larsson RN, MSN, [place] Mobile: [phone]

## 2021-12-13 ENCOUNTER — Other Ambulatory Visit: Payer: Self-pay | Admitting: Family Medicine

## 2021-12-13 DIAGNOSIS — E785 Hyperlipidemia, unspecified: Secondary | ICD-10-CM | POA: Diagnosis not present

## 2021-12-13 DIAGNOSIS — I129 Hypertensive chronic kidney disease with stage 1 through stage 4 chronic kidney disease, or unspecified chronic kidney disease: Secondary | ICD-10-CM | POA: Diagnosis not present

## 2021-12-13 DIAGNOSIS — N182 Chronic kidney disease, stage 2 (mild): Secondary | ICD-10-CM

## 2021-12-13 DIAGNOSIS — R251 Tremor, unspecified: Secondary | ICD-10-CM

## 2021-12-13 NOTE — Telephone Encounter (Signed)
Pt is requesting an update on medication refills.  Pt stated he has one tablet.   Please advise.

## 2021-12-13 NOTE — Telephone Encounter (Signed)
Requested Prescriptions  Pending Prescriptions Disp Refills  . propranolol (INDERAL) 20 MG tablet [Pharmacy Med Name: PROPRANOLOL 20 MG TABLET] 180 tablet 1    Sig: TAKE 1 TABLET BY MOUTH TWICE A DAY     Cardiovascular:  Beta Blockers Passed - 12/13/2021  8:01 AM      Passed - Last BP in normal range    BP Readings from Last 1 Encounters:  10/08/21 126/84         Passed - Last Heart Rate in normal range    Pulse Readings from Last 1 Encounters:  10/08/21 (!) 53         Passed - Valid encounter within last 6 months    Recent Outpatient Visits          2 months ago SK (seborrheic keratosis)   [place], DO   8 months ago Bilateral shoulder bursitis   [place], DO   1 year ago Annual physical exam   [place], DO   1 year ago Rotator cuff impingement syndrome, left   [place], DO   1 year ago Rash and nonspecific skin eruption   [facility], Lupita Raider, [place]

## 2022-01-07 ENCOUNTER — Other Ambulatory Visit: Payer: Self-pay | Admitting: Family Medicine

## 2022-01-07 DIAGNOSIS — E7849 Other hyperlipidemia: Secondary | ICD-10-CM

## 2022-01-07 NOTE — Telephone Encounter (Signed)
Pt is requesting a refill for atorvastatin (LIPITOR) 10 MG tablet. Pt says that he has not reached out to his pharmacy. Advised pt to do so going forward. Pt expressed understanding.     Pharmacy[facility]  [id number] [place] Phone:  [phone] Fax:  [phone]     Future ov: none

## 2022-01-08 MED ORDER — ATORVASTATIN CALCIUM 10 MG PO TABS: 10.0000 mg | ORAL_TABLET | Freq: Every day | ORAL | 0 refills | Status: DC

## 2022-01-08 NOTE — Telephone Encounter (Signed)
Pt has called in [facility], pt states will be leaving tomorrow to travel out of state, if med not refilled today would need to call back with a [employer]. Fu at [phone]

## 2022-01-08 NOTE — Telephone Encounter (Signed)
Requested Prescriptions  Pending Prescriptions Disp Refills  . atorvastatin (LIPITOR) 10 MG tablet 90 tablet 0    Sig: Take 1 tablet (10 mg total) by mouth daily.     Cardiovascular:  Antilipid - Statins Failed - 01/08/2022  9:39 AM      Failed - Lipid Panel in normal range within the last 12 months    Cholesterol, Total  Date Value Ref Range Status  10/15/2015 121 100 - 199 mg/dL Final   Cholesterol  Date Value Ref Range Status  11/16/2020 144 <200 mg/dL Final   LDL Cholesterol (Calc)  Date Value Ref Range Status  11/16/2020 76 mg/dL (calc) Final    Comment:    Reference range: <100 . Desirable range <100 mg/dL for primary prevention;   <70 mg/dL for patients with CHD or diabetic patients  with > or = 2 CHD risk factors. Marland Kitchen LDL-C is now calculated using the Martin-Hopkins  calculation, which is a validated novel method providing  better accuracy than the Friedewald equation in the  estimation of LDL-C.  Cresenciano Genre et al. Annamaria Helling. 7948;016(55): 2061-2068  (http://education.[facility].com/faq/FAQ164)    HDL  Date Value Ref Range Status  11/16/2020 48 > OR = 40 mg/dL Final  10/15/2015 47 >39 mg/dL Final   Triglycerides  Date Value Ref Range Status  11/16/2020 110 <150 mg/dL Final         Passed - Patient is not pregnant      Passed - Valid encounter within last 12 months    Recent Outpatient Visits          3 months ago SK (seborrheic keratosis)   [place], DO   9 months ago Bilateral shoulder bursitis   [place], DO   1 year ago Annual physical exam   [place], DO   1 year ago Rotator cuff impingement syndrome, left   [place], DO   1 year ago Rash and nonspecific skin eruption   [facility], Lupita Raider, [place]

## 2022-02-24 ENCOUNTER — Other Ambulatory Visit: Payer: Self-pay | Admitting: Family Medicine

## 2022-02-24 DIAGNOSIS — E1122 Type 2 diabetes mellitus with diabetic chronic kidney disease: Secondary | ICD-10-CM | POA: Diagnosis not present

## 2022-02-24 DIAGNOSIS — E1169 Type 2 diabetes mellitus with other specified complication: Secondary | ICD-10-CM | POA: Diagnosis not present

## 2022-02-24 DIAGNOSIS — Z008 Encounter for other general examination: Secondary | ICD-10-CM | POA: Diagnosis not present

## 2022-02-24 DIAGNOSIS — I10 Essential (primary) hypertension: Secondary | ICD-10-CM

## 2022-02-24 DIAGNOSIS — I700 Atherosclerosis of aorta: Secondary | ICD-10-CM | POA: Diagnosis not present

## 2022-02-24 DIAGNOSIS — I129 Hypertensive chronic kidney disease with stage 1 through stage 4 chronic kidney disease, or unspecified chronic kidney disease: Secondary | ICD-10-CM | POA: Diagnosis not present

## 2022-02-24 DIAGNOSIS — E785 Hyperlipidemia, unspecified: Secondary | ICD-10-CM | POA: Diagnosis not present

## 2022-02-24 DIAGNOSIS — Z6823 Body mass index (BMI) 23.0-23.9, adult: Secondary | ICD-10-CM | POA: Diagnosis not present

## 2022-02-24 DIAGNOSIS — N182 Chronic kidney disease, stage 2 (mild): Secondary | ICD-10-CM | POA: Diagnosis not present

## 2022-02-24 NOTE — Telephone Encounter (Signed)
Medication Refill - Medication: losartan (COZAAR) 100 MG tablet / pt is out of medication   Has the patient contacted their pharmacy? No. /pt was getting from different pharmacy   (Agent: If no, request that the patient contact the pharmacy for the refill. If patient does not wish to contact the pharmacy document the reason why and proceed with request.) (Agent: If yes, when and what did the pharmacy advise?)  Preferred Pharmacy (with phone number or street name): [id number] [place] [address] Has the patient been seen for an appointment in the last year OR does the patient have an upcoming appointment? Yes.    Agent: Please be advised that RX refills may take up to 3 business days. We ask that you follow-up with your pharmacy.

## 2022-02-25 MED ORDER — LOSARTAN POTASSIUM 50 MG PO TABS: 50.0000 mg | ORAL_TABLET | Freq: Every day | ORAL | 3 refills | Status: DC

## 2022-02-25 NOTE — Telephone Encounter (Signed)
Requested medication (s) are due for refill today: yes  Requested medication (s) are on the active medication list: yes  Last refill:  11/15/20 #90 3 RF  Future visit scheduled: yes  Notes to clinic:  overdue lab work   Requested Prescriptions  Pending Prescriptions Disp Refills   losartan (COZAAR) 100 MG tablet 90 tablet 3    Sig: Take 1 tablet (100 mg total) by mouth daily.     Cardiovascular:  Angiotensin Receptor Blockers Failed - 02/24/2022 11:34 AM      Failed - Cr in normal range and within 180 days    Creat  Date Value Ref Range Status  11/16/2020 1.19 (H) 0.70 - 1.18 mg/dL Final    Comment:    For patients >64 years of age, the reference limit for Creatinine is approximately 13% higher for people identified as African-American. .          Failed - K in normal range and within 180 days    Potassium  Date Value Ref Range Status  11/16/2020 4.3 3.5 - 5.3 mmol/L Final         Passed - Patient is not pregnant      Passed - Last BP in normal range    BP Readings from Last 1 Encounters:  10/08/21 126/84         Passed - Valid encounter within last 6 months    Recent Outpatient Visits           4 months ago SK (seborrheic keratosis)   [place], DO   11 months ago Bilateral shoulder bursitis   [place], DO   1 year ago Annual physical exam   [place], DO   1 year ago Rotator cuff impingement syndrome, left   [place], DO   1 year ago Rash and nonspecific skin eruption   [facility], Lupita Raider, FNP       Future Appointments             In 2 weeks [place], Devonne Doughty, [facility], [facility]

## 2022-03-13 ENCOUNTER — Ambulatory Visit (INDEPENDENT_AMBULATORY_CARE_PROVIDER_SITE_OTHER): Payer: No Typology Code available for payment source | Admitting: Family Medicine

## 2022-03-13 ENCOUNTER — Encounter: Payer: Self-pay | Admitting: Family Medicine

## 2022-03-13 VITALS — BP 144/86 | HR 56 | Ht 74.0 in | Wt 180.4 lb

## 2022-03-13 DIAGNOSIS — N182 Chronic kidney disease, stage 2 (mild): Secondary | ICD-10-CM | POA: Diagnosis not present

## 2022-03-13 DIAGNOSIS — F5101 Primary insomnia: Secondary | ICD-10-CM | POA: Diagnosis not present

## 2022-03-13 DIAGNOSIS — R251 Tremor, unspecified: Secondary | ICD-10-CM

## 2022-03-13 DIAGNOSIS — E7849 Other hyperlipidemia: Secondary | ICD-10-CM | POA: Diagnosis not present

## 2022-03-13 DIAGNOSIS — Z23 Encounter for immunization: Secondary | ICD-10-CM

## 2022-03-13 DIAGNOSIS — R7309 Other abnormal glucose: Secondary | ICD-10-CM | POA: Diagnosis not present

## 2022-03-13 DIAGNOSIS — Z0000 Encounter for general adult medical examination without abnormal findings: Secondary | ICD-10-CM

## 2022-03-13 DIAGNOSIS — I129 Hypertensive chronic kidney disease with stage 1 through stage 4 chronic kidney disease, or unspecified chronic kidney disease: Secondary | ICD-10-CM | POA: Diagnosis not present

## 2022-03-13 DIAGNOSIS — I10 Essential (primary) hypertension: Secondary | ICD-10-CM | POA: Diagnosis not present

## 2022-03-13 DIAGNOSIS — R351 Nocturia: Secondary | ICD-10-CM

## 2022-03-13 DIAGNOSIS — Z125 Encounter for screening for malignant neoplasm of prostate: Secondary | ICD-10-CM | POA: Diagnosis not present

## 2022-03-13 DIAGNOSIS — M65341 Trigger finger, right ring finger: Secondary | ICD-10-CM | POA: Diagnosis not present

## 2022-03-13 MED ORDER — TRAZODONE HCL 100 MG PO TABS: 100.0000 mg | ORAL_TABLET | Freq: Every day | ORAL | 2 refills | Status: DC

## 2022-03-13 MED ORDER — ATORVASTATIN CALCIUM 10 MG PO TABS: 10.0000 mg | ORAL_TABLET | Freq: Every day | ORAL | 3 refills | Status: DC

## 2022-03-13 MED ORDER — PROPRANOLOL HCL 20 MG PO TABS: 20.0000 mg | ORAL_TABLET | Freq: Two times a day (BID) | ORAL | 3 refills | Status: DC

## 2022-03-13 NOTE — Assessment & Plan Note (Signed)
Elevated BP readings Complication with CKD-II   Dose increase Losartan from 50 to '100mg'$  See A&P

## 2022-03-13 NOTE — Patient Instructions (Addendum)
Thank you for coming to the office today.  Check your records for Shingles vaccine, if it was earlier than 03/2016, it was likely the original, 1 shot, you would be eligible for updated new version Shingrix 2 shots, 2-6 month apart.  Flu Shot today  Future COVID when ready  Can hold off on RSV vaccine  R Ring Trigger Finger Use topical Voltaren to reduce inflammation Recommend avoid high intensity repetitive activities May warrant future consult from Ortho to do injection - or future surgery very minor brief and very effective.  Restart Trazodone for insomnia sleep. Can do the half tablet at first = '50mg'$ , then eventually go up to 1 whole tab '100mg'$  daily  Labs today.  Double dose Losartan '50mg'$  x 2 = '100mg'$  daily  Continue Propranolol  Let me know when ready for new rx Losartan '100mg'$ . We can order if your BP at home is improved  Also, if you would like to repeat another MRI for the kidney cyst, please let me know, last one was 2021 and we can check it yearly for up to max 5 years.   Please schedule a Follow-up Appointment to: Return in about 6 months (around 09/11/2022) for 6 month follow-up HTN, CKD, Insomnia updates.  If you have any other questions or concerns, please feel free to call the office or send a message through [place]. You may also schedule an earlier appointment if necessary.  Additionally, you may be receiving a survey about your experience at our office within a few days to 1 week by e-mail or mail. We value your feedback.  [name], DO [place]

## 2022-03-13 NOTE — Progress Notes (Signed)
Subjective:    [name], male    DOB: 05-Jun-1951, 71 y.o.   MRN: [id number]  Jesse Figueroa is a 71 y.o. male presenting on 03/13/2022 for Annual Exam   HPI  Here for Annual Physical and Lab Orders  Elevated A1c / Pre-Diabetes Improved diet and more water A1c 6.1- to 6.3 CBGs: Not check CBG at home Meds: None (never on) Currently on ARB - Family history of DM, Mother Denies hypoglycemia, polyuria, polydipsia   CHRONIC HTN CKD-II Improved creatinine Reports doing well. He does report some elevated BP at times, usually in afternoon, ranging 130-140 at times, not always. Checks BP on wrist regularly. Current Meds - Losartan '100mg'$  daily, Propranolol '20mg'$  BID for essential tremor and HTN   Reports good compliance, took meds today. Tolerating well, w/o complaints. Lifestyle: - Diet: improving - Exercise: regular Admits rare headache Denies CP, dyspnea, edema, dizziness / lightheadedness   Kidney Cyst, non specific lesion R upper kidney 3.5cm Bosniak 59F Cystic lesion. He was advised to repeat MRI in 6 months from 04/2019 Asymptomatic. Asking about follow-up MRI  MR Abdomen repeat for kidney cyst if he is interested Last done 2021, advised could do yearly for 5 years   HYPERLIPIDEMIA: Due for lipids - Currently taking Atorvastatin '10mg'$ , tolerating well without side effects or myalgias Lifestyle - Diet: limited - Exercise: improved activity exercise now   Health Maintenance: Last colonoscopy 05/13/21, [facility], repeat 5 years, approx 2027  Flu Shot today  Future COVID Booster when ready.      10/04/2021    8:11 AM 03/20/2021   10:43 AM 10/02/2020    9:01 AM  Depression screen PHQ 2/9  Decreased Interest 0 0 0  Down, Depressed, Hopeless 0 0 0  PHQ - 2 Score 0 0 0  Altered sleeping  2   Tired, decreased energy  0   Change in appetite  0   Feeling bad or failure about yourself   0   Trouble concentrating  0   Moving slowly or fidgety/restless  0    Suicidal thoughts  0   PHQ-9 Score  2   Difficult doing work/chores  Not difficult at all     Past Medical History:  Diagnosis Date   Age-related nuclear cataract of both eyes 05/11/2018   Arthritis of hand    Benign hypertension with CKD (chronic kidney disease), stage II    CKD (chronic kidney disease), stage II    Hyperlipidemia    Hypertension    Tremor    Past Surgical History:  Procedure Laterality Date   COLONOSCOPY N/A 05/13/2021   Procedure: COLONOSCOPY;  Surgeon: Lesly Rubenstein, MD;  Location: [facility] ENDOSCOPY;  Service: Endoscopy;  Laterality: N/A;   COLONOSCOPY WITH PROPOFOL N/A 02/05/2016   Procedure: COLONOSCOPY WITH PROPOFOL;  Surgeon: Lollie Sails, MD;  Location: [facility] ENDOSCOPY;  Service: Endoscopy;  Laterality: N/A;   EYE SURGERY  Lasic   Social History   Socioeconomic History   Marital status: Married    Spouse name: Not on file   Number of children: Not on file   Years of education: Not on file   Highest education level: Not on file  Occupational History   Occupation: Presenter, 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    Comment: [employer]  Tobacco Use   Smoking status: Never   Smokeless tobacco: Never  Vaping Use   Vaping Use: Never used  Substance and Sexual Activity   Alcohol use: No  Drug use: No   Sexual activity: Yes  Other Topics Concern   Not on file  Social History Narrative   Track coach 5 days a week    Social Determinants of Health   Financial Resource Strain: Low Risk  (10/04/2021)   Overall Financial Resource Strain (CARDIA)    Difficulty of Paying Living Expenses: Not hard at all  Food Insecurity: No Food Insecurity (10/04/2021)   Hunger Vital Sign    Worried About Running Out of Food in the Last Year: Never true    Ran Out of Food in the Last Year: Never true  Transportation Needs: No Transportation Needs (10/04/2021)   PRAPARE - Hydrologist (Medical): No    Lack of Transportation (Non-Medical): No   Physical Activity: Insufficiently Active (10/04/2021)   Exercise Vital Sign    Days of Exercise per Week: 3 days    Minutes of Exercise per Session: 30 min  Stress: No Stress Concern Present (10/04/2021)   [place]    Feeling of Stress : Not at all  Social Connections: [place] (12/12/2021)   Social Connection and Isolation Panel [NHANES]    Frequency of Communication with Friends and Family: More than three times a week    Frequency of Social Gatherings with Friends and Family: More than three times a week    Attends Religious Services: 1 to 4 times per year    Active Member of [employer] or Organizations: Yes    Attends Archivist Meetings: 1 to 4 times per year    Marital Status: Married  Human resources officer Violence: Not At Risk (10/04/2021)   Humiliation, Afraid, Rape, and Kick questionnaire    Fear of Current or Ex-Partner: No    Emotionally Abused: No    Physically Abused: No    Sexually Abused: No   Family History  Problem Relation Age of Onset   Heart disease Mother    Hypertension Mother    Diabetes Mother    Cancer Father    Current Outpatient Medications on File Prior to Visit  Medication Sig   aspirin 81 MG chewable tablet Chew by mouth.   calcium carbonate (TUMS - DOSED IN MG ELEMENTAL CALCIUM) 500 MG chewable tablet Chew 1 tablet by mouth daily as needed for indigestion or heartburn.   diclofenac Sodium (VOLTAREN) 1 % GEL Apply 2 g topically 4 (four) times daily as needed (joint pain arthritis shoulder).   fluticasone (FLONASE) 50 MCG/ACT nasal spray USE 2 SPRAYS IN BOTH NOSTRILS DAILY. USE FOR 4 TO 6 WEEKS THEN STOP AND USE SEASONALLY OR AS NEEDED.   losartan (COZAAR) 50 MG tablet Take 2 tablets (100 mg total) by mouth daily.   No current facility-administered medications on file prior to visit.    Review of Systems  Constitutional:  Negative for activity change, appetite change,  chills, diaphoresis, fatigue and fever.  HENT:  Negative for congestion and hearing loss.   Eyes:  Negative for visual disturbance.  Respiratory:  Negative for cough, chest tightness, shortness of breath and wheezing.   Cardiovascular:  Negative for chest pain, palpitations and leg swelling.  Gastrointestinal:  Negative for abdominal pain, constipation, diarrhea, nausea and vomiting.  Genitourinary:  Negative for dysuria, frequency and hematuria.  Musculoskeletal:  Negative for arthralgias and neck pain.  Skin:  Negative for rash.  Neurological:  Negative for dizziness, weakness, light-headedness, numbness and headaches.  Hematological:  Negative for adenopathy.  Psychiatric/Behavioral:  Negative for behavioral problems, dysphoric mood and sleep disturbance.    Per HPI unless specifically indicated above     Objective:    BP (!) 144/86 (BP Location: Left Arm, Cuff Size: Normal)   Pulse (!) 56   Ht '6\' 2"'$  (1.88 m)   Wt 180 lb 6.4 oz (81.8 kg)   SpO2 100%   BMI 23.16 kg/m   Wt Readings from Last 3 Encounters:  03/13/22 180 lb 6.4 oz (81.8 kg)  10/08/21 179 lb (81.2 kg)  10/04/21 176 lb 9.6 oz (80.1 kg)    Physical Exam Vitals and nursing note reviewed.  Constitutional:      General: He is not in acute distress.    Appearance: He is well-developed. He is not diaphoretic.     Comments: Well-appearing, comfortable, cooperative  HENT:     Head: Normocephalic and atraumatic.  Eyes:     General:        Right eye: No discharge.        Left eye: No discharge.     Conjunctiva/sclera: Conjunctivae normal.     Pupils: Pupils are equal, round, and reactive to light.  Neck:     Thyroid: No thyromegaly.     Vascular: No carotid bruit.  Cardiovascular:     Rate and Rhythm: Normal rate and regular rhythm.     Pulses: Normal pulses.     Heart sounds: Normal heart sounds. No murmur heard. Pulmonary:     Effort: Pulmonary effort is normal. No respiratory distress.     Breath sounds:  Normal breath sounds. No wheezing or rales.  Abdominal:     General: Bowel sounds are normal. There is no distension.     Palpations: Abdomen is soft. There is no mass.     Tenderness: There is no abdominal tenderness.  Musculoskeletal:        General: No tenderness. Normal range of motion.     Cervical back: Normal range of motion and neck supple.     Right lower leg: No edema.     Left lower leg: No edema.     Comments: Upper / Lower Extremities: - Normal muscle tone, strength bilateral upper extremities 5/5, lower extremities 5/5  Lymphadenopathy:     Cervical: No cervical adenopathy.  Skin:    General: Skin is warm and dry.     Findings: No erythema or rash.  Neurological:     Mental Status: He is alert and oriented to person, place, and time.     Comments: Distal sensation intact to light touch all extremities  Psychiatric:        Mood and Affect: Mood normal.        Behavior: Behavior normal.        Thought Content: Thought content normal.     Comments: Well groomed, good eye contact, normal speech and thoughts       Results for orders placed or performed during the hospital encounter of 05/13/21  Surgical pathology  Result Value Ref Range   SURGICAL PATHOLOGY      SURGICAL PATHOLOGY CASE: [id number] PATIENT: [facility] Pathology Report     Specimen Submitted: A. Colon polyp, ascending; cold snare  Clinical History: Z83.71 family history of polyps in the colon.  Colon polyp, internal hemorrhoids      DIAGNOSIS: A.  COLON POLYP, ASCENDING; COLD SNARE: - TUBULAR ADENOMA. - NEGATIVE FOR HIGH-GRADE DYSPLASIA AND MALIGNANCY.  GROSS DESCRIPTION: A. Labeled: Cold snared ascending colon polyp Received:  Formalin Collection time: 7:27 AM on 05/13/2021 Placed into formalin time: 7:27 AM on 05/13/2021 Tissue fragment(s): Multiple Size: Aggregate, 0.5 x 0.4 x 0.2 cm Description: Tan soft tissue fragments Entirely submitted in 1 cassette.  RB  05/13/2021   Final Diagnosis performed by Bryan Lemma, MD.   Electronically signed 05/15/2021 2:21:50PM The electronic signature indicates that the named Attending Pathologist has evaluated the specimen Technical component performed at [facility], [address] [place], [place] [address] Lab: [phone] Dir: Rush Farmer, MD, MMM  Professional component performed at [facility], [facility], [place], [place], [place] [address] Lab: [phone] Dir: Kathi Simpers, MD       Assessment & Plan:   Problem List Items Addressed This Visit     Benign hypertension with CKD (chronic kidney disease), stage II    Elevated BP readings Complication with CKD-II   Dose increase Losartan from 50 to '100mg'$  See A&P      Relevant Medications   atorvastatin (LIPITOR) 10 MG tablet   propranolol (INDERAL) 20 MG tablet   losartan (COZAAR) 50 MG tablet   Other Relevant Orders   COMPLETE METABOLIC PANEL WITH GFR   CBC with Differential/Platelet   Hyperlipidemia   Relevant Medications   atorvastatin (LIPITOR) 10 MG tablet   propranolol (INDERAL) 20 MG tablet   losartan (COZAAR) 50 MG tablet   Other Relevant Orders   COMPLETE METABOLIC PANEL WITH GFR   CBC with Differential/Platelet   Lipid panel   TSH   Insomnia   Relevant Medications   traZODone (DESYREL) 100 MG tablet   Other Visit Diagnoses     Annual physical exam    -  Primary   Relevant Orders   COMPLETE METABOLIC PANEL WITH GFR   CBC with Differential/Platelet   Lipid panel   Hemoglobin A1c   PSA   Needs flu shot       Relevant Orders   Flu Vaccine QUAD High Dose(Fluad) (Completed)   Nocturia       Abnormal glucose       Relevant Orders   Hemoglobin A1c   Has a tremor       Relevant Medications   propranolol (INDERAL) 20 MG tablet   Trigger finger, right ring finger       Essential hypertension       Relevant Medications   atorvastatin (LIPITOR) 10 MG tablet   propranolol (INDERAL) 20 MG  tablet   losartan (COZAAR) 50 MG tablet       Updated Health Maintenance information Fasting labs ordered pending Encouraged improvement to lifestyle with diet and exercise Goal of weight loss  Updated Colonoscopy, next due 5 years.  Check your records for Shingles vaccine, if it was earlier than 03/2016, it was likely the original, 1 shot, you would be eligible for updated new version Shingrix 2 shots, 2-6 month apart.  Flu Shot today  Future COVID when ready  Can hold off on RSV vaccine  R Ring Trigger Finger Use topical Voltaren to reduce inflammation Recommend avoid high intensity repetitive activities May warrant future consult from Ortho to do injection - or future surgery very minor brief and very effective.  Restart Trazodone for insomnia sleep. Can do the half tablet at first = '50mg'$ , then eventually go up to 1 whole tab '100mg'$  daily  Labs today.  HTN Double dose Losartan '50mg'$  x 2 = '100mg'$  daily Continue Propranolol Let me know when ready for new rx Losartan '100mg'$ . We can order if  your BP at home is improved  HLD Check Lipids Continue Atorvastatin  Also, if you would like to repeat another MRI for the kidney cyst, please let me know, last one was 2021 and we can check it yearly for up to max 5 years.   Meds ordered this encounter  Medications   atorvastatin (LIPITOR) 10 MG tablet    Sig: Take 1 tablet (10 mg total) by mouth daily.    Dispense:  90 tablet    Refill:  3    Add refills for future   propranolol (INDERAL) 20 MG tablet    Sig: Take 1 tablet (20 mg total) by mouth 2 (two) times daily.    Dispense:  180 tablet    Refill:  3    Add extra refills for future   traZODone (DESYREL) 100 MG tablet    Sig: Take 1 tablet (100 mg total) by mouth at bedtime. May start with half tab '50mg'$  nightly then increase to 1 tab '100mg'$  nightly for insomnia    Dispense:  30 tablet    Refill:  2      Follow up plan: Return in about 6 months (around 09/11/2022) for 6  month follow-up HTN, CKD, Insomnia updates.  [name], [facility] 03/13/2022, 9:05 AM

## 2022-03-14 LAB — CBC WITH DIFFERENTIAL/PLATELET
Absolute Monocytes: 400 cells/uL (ref 200–950)
Basophils Absolute: 30 cells/uL (ref 0–200)
Basophils Relative: 0.8 %
Eosinophils Absolute: 89 cells/uL (ref 15–500)
Eosinophils Relative: 2.4 %
HCT: 44.5 % (ref 38.5–50.0)
Hemoglobin: 14.5 g/dL (ref 13.2–17.1)
Lymphs Abs: 1447 cells/uL (ref 850–3900)
MCH: 28.2 pg (ref 27.0–33.0)
MCHC: 32.6 g/dL (ref 32.0–36.0)
MCV: 86.4 fL (ref 80.0–100.0)
MPV: 10.3 fL (ref 7.5–12.5)
Monocytes Relative: 10.8 %
Neutro Abs: 1735 cells/uL (ref 1500–7800)
Neutrophils Relative %: 46.9 %
Platelets: 307 10*3/uL (ref 140–400)
RBC: 5.15 10*6/uL (ref 4.20–5.80)
RDW: 13.6 % (ref 11.0–15.0)
Total Lymphocyte: 39.1 %
WBC: 3.7 10*3/uL — ABNORMAL LOW (ref 3.8–10.8)

## 2022-03-14 LAB — LIPID PANEL
Cholesterol: 154 mg/dL (ref <200)
HDL: 46 mg/dL (ref >=40)
LDL Cholesterol (Calc): 88 mg/dL (calc)
Non-HDL Cholesterol (Calc): 108 mg/dL (calc) (ref <130)
Total CHOL/HDL Ratio: 3.3 (calc) (ref <5.0)
Triglycerides: 102 mg/dL (ref <150)

## 2022-03-14 LAB — COMPLETE METABOLIC PANEL WITH GFR
AG Ratio: 1.6 (calc) (ref 1.0–2.5)
ALT: 28 U/L (ref 9–46)
AST: 21 U/L (ref 10–35)
Albumin: 4.3 g/dL (ref 3.6–5.1)
Alkaline phosphatase (APISO): 100 U/L (ref 35–144)
BUN: 14 mg/dL (ref 7–25)
CO2: 29 mmol/L (ref 20–32)
Calcium: 9.5 mg/dL (ref 8.6–10.3)
Chloride: 103 mmol/L (ref 98–110)
Creat: 1.17 mg/dL (ref 0.70–1.28)
Globulin: 2.7 g/dL (calc) (ref 1.9–3.7)
Glucose, Bld: 107 mg/dL — ABNORMAL HIGH (ref 65–99)
Potassium: 4.3 mmol/L (ref 3.5–5.3)
Sodium: 139 mmol/L (ref 135–146)
Total Bilirubin: 0.8 mg/dL (ref 0.2–1.2)
Total Protein: 7 g/dL (ref 6.1–8.1)
eGFR: 66 mL/min/{1.73_m2} (ref >=60)

## 2022-03-14 LAB — HEMOGLOBIN A1C
Hgb A1c MFr Bld: 6.4 % of total Hgb — ABNORMAL HIGH (ref <5.7)
Mean Plasma Glucose: 137 mg/dL
eAG (mmol/L): 7.6 mmol/L

## 2022-03-14 LAB — PSA: PSA: 1.24 ng/mL (ref <=4.00)

## 2022-03-14 LAB — TSH: TSH: 2.28 mIU/L (ref 0.40–4.50)

## 2022-04-03 ENCOUNTER — Other Ambulatory Visit: Payer: Self-pay | Admitting: Family Medicine

## 2022-04-03 DIAGNOSIS — E7849 Other hyperlipidemia: Secondary | ICD-10-CM

## 2022-04-03 NOTE — Telephone Encounter (Signed)
Pt called, needed clarification on which pharmacy he was wanting medication sent to. Advised him [facility] that I would check to make sure they received it on 03/13/22 #90/3. Pt verbalized understanding.   [facility] [place], [facility]. She states they are working on rx now for pu. No further assistance needed.   Requested Prescriptions  Pending Prescriptions Disp Refills   atorvastatin (LIPITOR) 10 MG tablet 90 tablet 3    Sig: Take 1 tablet (10 mg total) by mouth daily.     Cardiovascular:  Antilipid - Statins Failed - 04/03/2022 11:11 AM      Failed - Lipid Panel in normal range within the last 12 months    Cholesterol, Total  Date Value Ref Range Status  10/15/2015 121 100 - 199 mg/dL Final   Cholesterol  Date Value Ref Range Status  03/13/2022 154 <200 mg/dL Final   LDL Cholesterol (Calc)  Date Value Ref Range Status  03/13/2022 88 mg/dL (calc) Final    Comment:    Reference range: <100 . Desirable range <100 mg/dL for primary prevention;   <70 mg/dL for patients with CHD or diabetic patients  with > or = 2 CHD risk factors. Marland Kitchen LDL-C is now calculated using the Martin-Hopkins  calculation, which is a validated novel method providing  better accuracy than the Friedewald equation in the  estimation of LDL-C.  Cresenciano Genre et al. Annamaria Helling. 0786;754(49): 2061-2068  (http://education.Quest[employer].com/faq/FAQ164)    HDL  Date Value Ref Range Status  03/13/2022 46 > OR = 40 mg/dL Final  10/15/2015 47 >39 mg/dL Final   Triglycerides  Date Value Ref Range Status  03/13/2022 102 <150 mg/dL Final         Passed - Patient is not pregnant      Passed - Valid encounter within last 12 months    Recent Outpatient Visits           3 weeks ago Annual physical exam   [place], DO   5 months ago SK (seborrheic keratosis)   [place], DO   1 year ago  Bilateral shoulder bursitis   [place], DO   1 year ago Annual physical exam   [place], DO   1 year ago Rotator cuff impingement syndrome, left   [facility] Parks Ranger, Devonne Doughty, DO       Future Appointments             In 5 months Parks Ranger, Devonne Doughty, [facility], [facility]

## 2022-04-03 NOTE — Telephone Encounter (Signed)
Medication Refill - Medication: atorvastatin (LIPITOR) 10 MG tablet  Has the patient contacted their pharmacy? No.  Preferred Pharmacy (with phone number or street name):  [id number] [address] Phone:  [phone] Fax:  [phone]     Has the patient been seen for an appointment in the last year OR does the patient have an upcoming appointment? Yes.    Agent: Please be advised that RX refills may take up to 3 business days. We ask that you follow-up with your pharmacy.  Caller states multiple fax has been sent for refill request

## 2022-04-04 ENCOUNTER — Ambulatory Visit (LOCAL_COMMUNITY_HEALTH_CENTER): Payer: No Typology Code available for payment source

## 2022-04-04 DIAGNOSIS — Z23 Encounter for immunization: Secondary | ICD-10-CM

## 2022-04-04 DIAGNOSIS — Z719 Counseling, unspecified: Secondary | ICD-10-CM

## 2022-04-04 NOTE — Progress Notes (Signed)
  Are you feeling sick today? No   Have you ever received a dose of COVID-19 Vaccine? [employer], [place], [place], [place], Other) Yes  If yes, which vaccine and how many doses?   PFIZER, 5   Did you bring the vaccination record card or other documentation?  No   Do you have a health condition or are undergoing treatment that makes you moderately or severely immunocompromised? This would include, but not be limited to: cancer, HIV, organ transplant, immunosuppressive therapy/high-dose corticosteroids, or moderate/severe primary immunodeficiency.  No  Have you received COVID-19 vaccine before or during hematopoietic cell transplant (HCT) or CAR-T-cell therapies? No  Have you ever had an allergic reaction to: (This would include a severe allergic reaction or a reaction that caused hives, swelling, or respiratory distress, including wheezing.) A component of a COVID-19 vaccine or a previous dose of COVID-19 vaccine? No   Have you ever had an allergic reaction to another vaccine (other thanCOVID-19 vaccine) or an injectable medication? (This would include a severe allergic reaction or a reaction that caused hives, swelling, or respiratory distress, including wheezing.)   No    Do you have a history of any of the following:  Myocarditis or Pericarditis No  Dermal fillers:  No  Multisystem Inflammatory Syndrome (MIS-C or MIS-A)? No  COVID-19 disease within the past 3 months? No  Vaccinated with monkeypox vaccine in the last 4 weeks? No  Eligible and administered [employer] [phone] vaccine, monitored, tolerated well. Verbalized understanding of VIS and NCIR copy. M.Taliana Mersereau, LPN.

## 2022-04-05 ENCOUNTER — Other Ambulatory Visit: Payer: Self-pay | Admitting: Family Medicine

## 2022-04-05 DIAGNOSIS — F5101 Primary insomnia: Secondary | ICD-10-CM

## 2022-04-07 NOTE — Telephone Encounter (Signed)
Requested medications are due for refill today.  no  Requested medications are on the active medications list.  yes  Last refill. 03/13/2022 #30 2 rf  Future visit scheduled.   yes  Notes to clinic.   Pharmacy comment: REQUEST FOR 90 DAYS PRESCRIPTION. DX Code Needed.    Psychiatry: Antidepressants        Requested Prescriptions  Pending Prescriptions Disp Refills   traZODone (DESYREL) 100 MG tablet [Pharmacy Med Name: TRAZODONE 100 MG TABLET] 90 tablet 1    Sig: Take 1 tablet (100 mg total) by mouth at bedtime. May start with half tab '50mg'$  nightly then increase to 1 tab '100mg'$  nightly for insomnia     Psychiatry: Antidepressants - Serotonin Modulator Passed - 04/05/2022 11:32 AM      Passed - Valid encounter within last 6 months    Recent Outpatient Visits           3 weeks ago Annual physical exam   [place], DO   6 months ago SK (seborrheic keratosis)   [place], DO   1 year ago Bilateral shoulder bursitis   [place], DO   1 year ago Annual physical exam   [place], DO   1 year ago Rotator cuff impingement syndrome, left   [facility] Parks Ranger, Devonne Doughty, DO       Future Appointments             In 5 months Parks Ranger, Devonne Doughty, [facility], [facility]

## 2022-04-28 ENCOUNTER — Encounter: Payer: No Typology Code available for payment source | Admitting: Family Medicine

## 2022-05-02 ENCOUNTER — Other Ambulatory Visit: Payer: Self-pay | Admitting: Family Medicine

## 2022-05-02 DIAGNOSIS — I10 Essential (primary) hypertension: Secondary | ICD-10-CM

## 2022-05-02 NOTE — Telephone Encounter (Signed)
Requested medications are due for refill today.  unsure  Requested medications are on the active medications list.  yes  Last refill. 03/13/2022 #90 3 rf  Future visit scheduled.   yes  Notes to clinic.  Medication is listed as historical.    Requested Prescriptions  Pending Prescriptions Disp Refills   losartan (COZAAR) 50 MG tablet 90 tablet 3    Sig: Take 2 tablets (100 mg total) by mouth daily.     Cardiovascular:  Angiotensin Receptor Blockers Failed - 05/02/2022  3:43 PM      Failed - Last BP in normal range    BP Readings from Last 1 Encounters:  03/13/22 (!) 144/86         Passed - Cr in normal range and within 180 days    Creat  Date Value Ref Range Status  03/13/2022 1.17 0.70 - 1.28 mg/dL Final         Passed - K in normal range and within 180 days    Potassium  Date Value Ref Range Status  03/13/2022 4.3 3.5 - 5.3 mmol/L Final         Passed - Patient is not pregnant      Passed - Valid encounter within last 6 months    Recent Outpatient Visits           1 month ago Annual physical exam   [place], DO   6 months ago SK (seborrheic keratosis)   [place], DO   1 year ago Bilateral shoulder bursitis   [place], DO   1 year ago Annual physical exam   [place], DO   1 year ago Rotator cuff impingement syndrome, left   [facility] Parks Ranger, Devonne Doughty, DO       Future Appointments             In 4 months Parks Ranger, Devonne Doughty, [facility], [facility]

## 2022-05-02 NOTE — Telephone Encounter (Signed)
Medication Refill - Medication: losartan (COZAAR) 50 MG tablet / pt is completely out   Pts pharmacy only has an RX with directions to take 1 a day / pt was advised to increase to 2 a day / refill sent in sept was not received by pharmacy / please advise   Has the patient contacted their pharmacy? Yes.   (Agent: If no, request that the patient contact the pharmacy for the refill. If patient does not wish to contact the pharmacy document the reason why and proceed with request.) (Agent: If yes, when and what did the pharmacy advise?)  Preferred Pharmacy (with phone number or street [id number] [address]  Has the patient been seen for an appointment in the last year OR does the patient have an upcoming appointment? Yes.    Agent: Please be advised that RX refills may take up to 3 business days. We ask that you follow-up with your pharmacy.

## 2022-05-05 MED ORDER — LOSARTAN POTASSIUM 100 MG PO TABS: 100.0000 mg | ORAL_TABLET | Freq: Every day | ORAL | 3 refills | Status: DC

## 2022-05-05 NOTE — Telephone Encounter (Signed)
Pt called in follow-up to prior call. Also see OV note from 9/28. States when pt is ready, request RX change from 50 to 100. Pt is calling because the difference of the 50 mg will have an additional 50 mg. He is requesting the updated RX of losartan (COZAAR) 50 MG tablet .   [id number] [place] [place]   Pt has been been without medication since Friday.   Please provide an update for the patient.

## 2022-05-19 ENCOUNTER — Ambulatory Visit: Payer: No Typology Code available for payment source

## 2022-06-05 DIAGNOSIS — H401231 Low-tension glaucoma, bilateral, mild stage: Secondary | ICD-10-CM | POA: Diagnosis not present

## 2022-06-05 DIAGNOSIS — Z9889 Other specified postprocedural states: Secondary | ICD-10-CM | POA: Diagnosis not present

## 2022-06-05 DIAGNOSIS — H25813 Combined forms of age-related cataract, bilateral: Secondary | ICD-10-CM | POA: Diagnosis not present

## 2022-06-05 DIAGNOSIS — H04123 Dry eye syndrome of bilateral lacrimal glands: Secondary | ICD-10-CM | POA: Diagnosis not present

## 2022-07-31 ENCOUNTER — Encounter: Payer: Self-pay | Admitting: Family Medicine

## 2022-07-31 ENCOUNTER — Ambulatory Visit (INDEPENDENT_AMBULATORY_CARE_PROVIDER_SITE_OTHER): Payer: No Typology Code available for payment source | Admitting: Family Medicine

## 2022-07-31 VITALS — BP 130/70 | HR 55 | Ht 72.0 in | Wt 182.0 lb

## 2022-07-31 DIAGNOSIS — M722 Plantar fascial fibromatosis: Secondary | ICD-10-CM | POA: Diagnosis not present

## 2022-07-31 DIAGNOSIS — G8929 Other chronic pain: Secondary | ICD-10-CM | POA: Diagnosis not present

## 2022-07-31 DIAGNOSIS — M79672 Pain in left foot: Secondary | ICD-10-CM | POA: Diagnosis not present

## 2022-07-31 NOTE — Patient Instructions (Addendum)
Thank you for coming to the office today.  Referral to [facility], [place] [address] Hours - M-F 8-5 Phone: [phone] phone   You most likely have Plantar Fasciitis of heel / foot. - This is inflammation of the fibrous connection on the bottom of the foot, and can have small micro tears over time that become painful. It usually will have flare ups lasting days to weeks, and may come back after it heals if it is re-aggravated again. - Often there is a bone spur or arthritis of the heel bone that causes this - Also it may be caused by abnormal footwear, walking pattern or other problems  Try TENSION NIGHT SPLINT  If you are experiencing an acute flare with pain, this is usually worst first thing in the morning when the plantar fascia is tight and stiff. First step out of bed is painful usually, and it may gradually improve with stretching and walking.  Try this first START anti inflammatory topical - OTC Voltaren (generic Diclofenac) topical 2-4 times a day as needed for pain swelling of affected joint for 1-2 weeks or longer.  If want to take stronger can use these: Recommend trial of Anti-inflammatory with Aleve OTC / Naproxen 220-260m tabs - take one or two with food and plenty of water TWICE daily every day (breakfast and dinner), for next 2 to 4 weeks, then you may take only as needed - DO NOT TAKE any ibuprofen, aleve, motrin while you are taking this medicine - It is safe to take Tylenol Ext Str 5081mtabs - take 1 to 2 (max dose 100076mevery 6 hours as needed for breakthrough pain, max 24 hour daily dose is 6 to 8 tablets or 4000m8mecommend: - Rest / relative rest with activity modification avoid overuse / prolonged stand - Ice packs (make sure you use a towel or sock / something to protect skin)  May also try topical muscle rub, icy hot, tiger balm  Start the exercises listed below, gradually increase them as  instructed. May be sore at first and hopefully will stretch out and help reduce pain later.  If not improving after 1-2 weeks on medicine, and stretching exercises and some rest - then can contact our office again for re-evaluation may consider other causes or can refer you to Orthopedic specialist to have second opinion, and consider a steroid injection.   Please schedule a Follow-up Appointment to: Return if symptoms worsen or fail to improve.  If you have any other questions or concerns, please feel free to call the office or send a message through [facility] may also schedule an earlier appointment if necessary.  Additionally, you may be receiving a survey about your experience at our office within a few days to 1 week by e-mail or mail. We value your feedback.  AlexNobie Putnam [facility]          Plantar Fascia Stretches / Exercises  See other page with pictures of each exercise.  Start with 1 or 2 of these exercises that you are most comfortable with. Do not do any exercises that cause you significant worsening pain. Some of these may cause some "stretching soreness" but it should go away after you stop the exercise, and get better over time. Gradually increase up to 3-4 exercises as tolerated.  You may begin exercising the muscles of your foot right away by gently  stretching them as follows:  Stretching: Towel stretch: Sit on a hard surface with your injured leg stretched out in front of you. Loop a towel around the ball of your foot and pull the towel toward your body keeping your knee straight. Hold this position for 15 to 30 seconds then relax. Repeat 3 times. When the towel stretch becomes to easy, you may begin doing the standing calf stretch.  Standing calf stretch: Facing a wall, put your hands against the wall at about eye level. Keep the injured leg back, the uninjured leg forward, and the heel of your injured leg on the  floor. Turn your injured foot slightly inward (as if you were pigeon-toed) as you slowly lean into the wall until you feel a stretch in the back of your calf. Hold for 15 to 30 seconds. Repeat 3 times. Do this exercise several times each day. When you can stand comfortably on your injured foot, you can begin stretching the bottom of your foot using the plantar fascia stretch.  Plantar fascia stretch: Stand with the ball of your injured foot on a stair. Reach for the bottom step with your heel until you feel a stretch in the arch of your foot. Hold this position for 15 to 30 seconds and then relax. Repeat 3 times. After you have stretched the bottom muscles of your foot, you can begin strengthening the top muscles of your foot.  Frozen can roll: Roll your bare injured foot back and forth from your heel to your mid-arch over a frozen juice can. Repeat for 3 to 5 minutes. This exercise is particularly helpful if done first thing in the morning. Towel pickup: With your heel on the ground, pick up a towel with your toes. Release. Repeat 10 to 20 times. When this gets easy, add more resistance by placing a book or small weight on the towel. Static and dynamic balance exercises Place a chair next to your non-injured leg and stand upright. (This will provide you with balance if needed.) Stand on your injured foot. Try to raise the arch of your foot while keeping your toes on the floor. Try to maintain this position and balance on your injured side for 30 seconds. This exercise can be made more difficult by doing it on a piece of foam or a pillow, or with your eyes closed. Stand in the same position as above. Keep your foot in this position and reach forward in front of you with your injured side's hand, allowing your knee to bend. Repeat this 10 times while maintaining the arch height. This exercise can be made more difficult by reaching farther in front of you. Do 2 sets. Stand in the same position as above.  While maintaining your arch height, reach the injured side's hand across your body toward the chair. The farther you reach, the more challenging the exercise. Do 2 sets of 10.  Next, you can begin strengthening the muscles of your foot and lower leg by using elastic tubing.  Strengthening: Resisted dorsiflexion: Sit with your injured leg out straight and your foot facing a doorway. Tie a loop in one end of the tubing. Put your foot through the loop so that the tubing goes around the arch of your foot. Tie a knot in the other end of the tubing and shut the knot in the door. Move backward until there is tension in the tubing. Keeping your knee straight, pull your foot toward your body, stretching the tubing. Slowly return  to the starting position. Do 3 sets of 10. Resisted plantar flexion: Sit with your leg outstretched and loop the middle section of the tubing around the ball of your foot. Hold the ends of the tubing in both hands. Gently press the ball of your foot down and point your toes, stretching the tubing. Return to the starting position. Do 3 sets of 10. Resisted inversion: Sit with your legs out straight and cross your uninjured leg over your injured ankle. Wrap the tubing around the ball of your injured foot and then loop it around your uninjured foot so that the tubing is anchored there at one end. Hold the other end of the tubing in your hand. Turn your injured foot inward and upward. This will stretch the tubing. Return to the starting position. Do 3 sets of 10. Resisted eversion: Sit with both legs stretched out in front of you, with your feet about a shoulder's width apart. Tie a loop in one end of the tubing. Put your injured foot through the loop so that the tubing goes around the arch of that foot and wraps around the outside of the uninjured foot. Hold onto the other end of the tubing with your hand to provide tension. Turn your injured foot up and out. Make sure you keep your uninjured  foot still so that it will allow the tubing to stretch as you move your injured foot. Return to the starting position. Do 3 sets of 10.

## 2022-07-31 NOTE — Progress Notes (Signed)
Subjective:    [name], male    DOB: July 19, 1950, 72 y.o.   MRN: [id number]  Jesse Figueroa is a 72 y.o. male presenting on 07/31/2022 for Foot Pain (Pt states left heel has been hurting for over a month. No injury that he is aware of. )  Patient presents for a same day appointment.   HPI  L Foot Plantar Fasciitis Recent onset 1 month approx L foot / inner heel pain, worse in morning when first moving and walking and then also worse at end of day. No injury or trauma. He is very active, running, coaching. He does not have swelling or numbness in this area.  Not on medication for this.      10/04/2021    8:11 AM 03/20/2021   10:43 AM 10/02/2020    9:01 AM  Depression screen PHQ 2/9  Decreased Interest 0 0 0  Down, Depressed, Hopeless 0 0 0  PHQ - 2 Score 0 0 0  Altered sleeping  2   Tired, decreased energy  0   Change in appetite  0   Feeling bad or failure about yourself   0   Trouble concentrating  0   Moving slowly or fidgety/restless  0   Suicidal thoughts  0   PHQ-9 Score  2   Difficult doing work/chores  Not difficult at all     Social History   Tobacco Use   Smoking status: Never   Smokeless tobacco: Never  Vaping Use   Vaping Use: Never used  Substance Use Topics   Alcohol use: No   Drug use: No    Review of Systems Per HPI unless specifically indicated above     Objective:    BP 130/70   Pulse (!) 55   Ht 6' (1.829 m)   Wt 182 lb (82.6 kg)   SpO2 99%   BMI 24.68 kg/m   Wt Readings from Last 3 Encounters:  07/31/22 182 lb (82.6 kg)  05/19/22 180 lb (81.6 kg)  03/13/22 180 lb 6.4 oz (81.8 kg)    Physical Exam Vitals and nursing note reviewed.  Constitutional:      General: He is not in acute distress.    Appearance: Normal appearance. He is well-developed. He is not diaphoretic.     Comments: Well-appearing, comfortable, cooperative  HENT:     Head: Normocephalic and atraumatic.  Eyes:     General:        Right eye: No  discharge.        Left eye: No discharge.     Conjunctiva/sclera: Conjunctivae normal.  Cardiovascular:     Rate and Rhythm: Normal rate.  Pulmonary:     Effort: Pulmonary effort is normal.  Musculoskeletal:     Comments: L Foot, inner medial aspect near heel with localized tenderness. No deformity or erythema or swelling. He has high arch.  Skin:    General: Skin is warm and dry.     Findings: No erythema or rash.  Neurological:     Mental Status: He is alert and oriented to person, place, and time.  Psychiatric:        Mood and Affect: Mood normal.        Behavior: Behavior normal.        Thought Content: Thought content normal.     Comments: Well groomed, good eye contact, normal speech and thoughts    Results for orders placed or performed in visit on  03/13/22  COMPLETE METABOLIC PANEL WITH GFR  Result Value Ref Range   Glucose, Bld 107 (H) 65 - 99 mg/dL   BUN 14 7 - 25 mg/dL   Creat 1.17 0.70 - 1.28 mg/dL   eGFR 66 > OR = 60 mL/min/1.82m   BUN/Creatinine Ratio SEE NOTE: 6 - 22 (calc)   Sodium 139 135 - 146 mmol/L   Potassium 4.3 3.5 - 5.3 mmol/L   Chloride 103 98 - 110 mmol/L   CO2 29 20 - 32 mmol/L   Calcium 9.5 8.6 - 10.3 mg/dL   Total Protein 7.0 6.1 - 8.1 g/dL   Albumin 4.3 3.6 - 5.1 g/dL   Globulin 2.7 1.9 - 3.7 g/dL (calc)   AG Ratio 1.6 1.0 - 2.5 (calc)   Total Bilirubin 0.8 0.2 - 1.2 mg/dL   Alkaline phosphatase (APISO) 100 35 - 144 U/L   AST 21 10 - 35 U/L   ALT 28 9 - 46 U/L  CBC with Differential/Platelet  Result Value Ref Range   WBC 3.7 (L) 3.8 - 10.8 Thousand/uL   RBC 5.15 4.20 - 5.80 Million/uL   Hemoglobin 14.5 13.2 - 17.1 g/dL   HCT 44.5 38.5 - 50.0 %   MCV 86.4 80.0 - 100.0 fL   MCH 28.2 27.0 - 33.0 pg   MCHC 32.6 32.0 - 36.0 g/dL   RDW 13.6 11.0 - 15.0 %   Platelets 307 140 - 400 Thousand/uL   MPV 10.3 7.5 - 12.5 fL   Neutro Abs 1,735 1,500 - 7,800 cells/uL   Lymphs Abs 1,447 850 - 3,900 cells/uL   Absolute Monocytes 400 200 - 950  cells/uL   Eosinophils Absolute 89 15 - 500 cells/uL   Basophils Absolute 30 0 - 200 cells/uL   Neutrophils Relative % 46.9 %   Total Lymphocyte 39.1 %   Monocytes Relative 10.8 %   Eosinophils Relative 2.4 %   Basophils Relative 0.8 %  Lipid panel  Result Value Ref Range   Cholesterol 154 <200 mg/dL   HDL 46 > OR = 40 mg/dL   Triglycerides 102 <150 mg/dL   LDL Cholesterol (Calc) 88 mg/dL (calc)   Total CHOL/HDL Ratio 3.3 <5.0 (calc)   Non-HDL Cholesterol (Calc) 108 <130 mg/dL (calc)  Hemoglobin A1c  Result Value Ref Range   Hgb A1c MFr Bld 6.4 (H) <5.7 % of total Hgb   Mean Plasma Glucose 137 mg/dL   eAG (mmol/L) 7.6 mmol/L  PSA  Result Value Ref Range   PSA 1.24 < OR = 4.00 ng/mL  TSH  Result Value Ref Range   TSH 2.28 0.40 - 4.50 mIU/L      Assessment & Plan:   Problem List Items Addressed This Visit   None Visit Diagnoses     Plantar fasciitis, left    -  Primary   Relevant Orders   Ambulatory referral to Podiatry   Chronic foot pain, left           Clinically consistent with subacute L plantar fasciitis based on history and exam, localized pain. Likely underlying etiology with prolonged standing / repetitive work known OA/DJD in other joints - No known injury. Unlikely fracture based on history, no bony tenderness. - Limited conservative therapy  Plan: 1. Reviewed diagnosis and management of plantar fasciitis 2. Wear Tension Night Splint 3. Topical Voltaren - may use oral NSAID if needed 4. May take Tylenol PRN breakthrough 5. May use topical muscle rub, ice 6. Emphasized importance of relative  rest, ice, and avoid prolonged standing and overuse 7. Handout given and explained appropriate stretching and home exercises important to do first thing in morning and later  Referral to Podiatry as well for further management. May warrant X-ray.  Orders Placed This Encounter  Procedures   Ambulatory referral to Podiatry    Referral Priority:   Routine     Referral Type:   Consultation    Referral Reason:   Specialty Services Required    Requested Specialty:   Podiatry    Number of Visits Requested:   1     No orders of the defined types were placed in this encounter.   Follow up plan: Return if symptoms worsen or fail to improve.   [name], [facility] 07/31/2022, 11:44 AM

## 2022-08-11 DIAGNOSIS — M216X1 Other acquired deformities of right foot: Secondary | ICD-10-CM | POA: Diagnosis not present

## 2022-08-11 DIAGNOSIS — M79672 Pain in left foot: Secondary | ICD-10-CM | POA: Diagnosis not present

## 2022-08-11 DIAGNOSIS — M216X2 Other acquired deformities of left foot: Secondary | ICD-10-CM | POA: Diagnosis not present

## 2022-08-11 DIAGNOSIS — E119 Type 2 diabetes mellitus without complications: Secondary | ICD-10-CM | POA: Diagnosis not present

## 2022-08-11 DIAGNOSIS — I739 Peripheral vascular disease, unspecified: Secondary | ICD-10-CM | POA: Diagnosis not present

## 2022-08-11 DIAGNOSIS — M722 Plantar fascial fibromatosis: Secondary | ICD-10-CM | POA: Diagnosis not present

## 2022-08-19 ENCOUNTER — Other Ambulatory Visit (INDEPENDENT_AMBULATORY_CARE_PROVIDER_SITE_OTHER): Payer: Self-pay | Admitting: Podiatry

## 2022-08-19 DIAGNOSIS — I739 Peripheral vascular disease, unspecified: Secondary | ICD-10-CM

## 2022-08-20 ENCOUNTER — Ambulatory Visit (INDEPENDENT_AMBULATORY_CARE_PROVIDER_SITE_OTHER): Payer: Self-pay

## 2022-08-20 DIAGNOSIS — I739 Peripheral vascular disease, unspecified: Secondary | ICD-10-CM

## 2022-09-03 DIAGNOSIS — H40023 Open angle with borderline findings, high risk, bilateral: Secondary | ICD-10-CM | POA: Diagnosis not present

## 2022-09-15 ENCOUNTER — Ambulatory Visit (INDEPENDENT_AMBULATORY_CARE_PROVIDER_SITE_OTHER): Payer: No Typology Code available for payment source | Admitting: Family Medicine

## 2022-09-15 ENCOUNTER — Encounter: Payer: Self-pay | Admitting: Family Medicine

## 2022-09-15 ENCOUNTER — Other Ambulatory Visit: Payer: Self-pay | Admitting: Family Medicine

## 2022-09-15 VITALS — BP 136/88 | HR 49 | Ht 72.0 in | Wt 181.6 lb

## 2022-09-15 DIAGNOSIS — F5101 Primary insomnia: Secondary | ICD-10-CM | POA: Diagnosis not present

## 2022-09-15 DIAGNOSIS — E7849 Other hyperlipidemia: Secondary | ICD-10-CM

## 2022-09-15 DIAGNOSIS — I129 Hypertensive chronic kidney disease with stage 1 through stage 4 chronic kidney disease, or unspecified chronic kidney disease: Secondary | ICD-10-CM

## 2022-09-15 DIAGNOSIS — R7303 Prediabetes: Secondary | ICD-10-CM

## 2022-09-15 DIAGNOSIS — M722 Plantar fascial fibromatosis: Secondary | ICD-10-CM | POA: Diagnosis not present

## 2022-09-15 DIAGNOSIS — Z0000 Encounter for general adult medical examination without abnormal findings: Secondary | ICD-10-CM

## 2022-09-15 DIAGNOSIS — N182 Chronic kidney disease, stage 2 (mild): Secondary | ICD-10-CM

## 2022-09-15 DIAGNOSIS — R351 Nocturia: Secondary | ICD-10-CM

## 2022-09-15 LAB — POCT GLYCOSYLATED HEMOGLOBIN (HGB A1C): Hemoglobin A1C: 6.7 % — AB (ref 4.0–5.6)

## 2022-09-15 NOTE — Assessment & Plan Note (Signed)
Seems primary chronic insomnia, more sleep maintenance Does not seem consistent with depression anxiety or behavioral health, also seems like stays active and appropriate sleep hygiene except often watching TV to help sleep. - May be more behavioral - Failed Trazodone in past, low dose 50mg . Not improve sleep hygiene  Plan Currently off of all medication, no OTC or rx.  Follow-up specifically for this issue if not improving in future - may offer rx medication Remeron vs hypnotic such as Ambien if needed for short-term he has concerns habit forming, would cautiously approach rx

## 2022-09-15 NOTE — Assessment & Plan Note (Signed)
Mild elevation but overall controlled Complication with CKD-II  Plan:  1. Continue current BP regimen Losartan 100mg  daily, Propranolol 20mg  BID 2. Encourage improved lifestyle - low sodium diet, regular exercise 3. Continue monitor BP outside office, bring readings to next visit, if persistently >140/90 or new symptoms notify office sooner

## 2022-09-15 NOTE — Assessment & Plan Note (Addendum)
A1c 6.7 today notable increase from previous Prior Range 6.3 to 6.5 at risk of progression to DM Concern with HTN. Fam history of DM  Plan:  1. Not on any therapy currently 2. Encourage improved lifestyle - low carb, low sugar diet, reduce portion size, continue improving regular exercise He plans to limit daily ice cream 6 mo

## 2022-09-15 NOTE — Patient Instructions (Addendum)
Thank you for coming to the office today.  Future recommend new updated Shingles vaccine Shingrix at pharmacy 2 doses.  A1c sugar today, pending result.  DUE for FASTING BLOOD WORK (no food or drink after midnight before the lab appointment, only water or coffee without cream/sugar on the morning of)  SCHEDULE "Lab Only" visit in the morning at the clinic for lab draw in 6 month   - Make sure Lab Only appointment is at about 1 week before your next appointment, so that results will be available  For Lab Results, once available within 2-3 days of blood draw, you can can log in to MyChart online to view your results and a brief explanation. Also, we can discuss results at next follow-up visit.    Please schedule a Follow-up Appointment to: Return in about 6 months (around 03/17/2023) for 6 month fasting lab only then 1 week later Annual Physical.  If you have any other questions or concerns, please feel free to call the office or send a message through [facility]. You may also schedule an earlier appointment if necessary.  Additionally, you may be receiving a survey about your experience at our office within a few days to 1 week by e-mail or mail. We value your feedback.  [name], DO [place]

## 2022-09-15 NOTE — Progress Notes (Signed)
Subjective:    [name], male    DOB: 1950/08/30, 72 y.o.   MRN: [id number]  Jesse Figueroa is a 72 y.o. male presenting on 09/15/2022 for Hypertension, Chronic Kidney Disease, and Insomnia   HPI  Elevated A1c / Pre-Diabetes A1c 6.1- to 6.5 prior Due for A1c today CBGs: Not check CBG at home Meds: None (never on) Currently on ARB - Family history of DM, Mother Denies hypoglycemia, polyuria, polydipsia   CHRONIC HTN CKD-II Last lab stable Creatinine 1.17 prior 1.17 to 1.22 Reports doing well. He does report some elevated BP at times, usually in afternoon, ranging 130-140 at times, not always. Checks BP on wrist regularly. Current Meds - Losartan 100mg  daily, Propranolol 20mg  BID for essential tremor and HTN   Reports good compliance, took meds today. Tolerating well, w/o complaints. Lifestyle: - Diet: improving - Exercise: regular Admits rare headache Denies CP, dyspnea, edema, dizziness / lightheadedness   Kidney Cyst, non specific lesion - PMH previously had surveillance on MR 2 years in a row.    HYPERLIPIDEMIA: Due for lipids - Currently taking Atorvastatin 10mg , tolerating well without side effects or myalgias Lifestyle - Diet: limited - Exercise: improved activity exercise now  Trigger fingers bilateral 4th ring fingers, mild. Not bothering him too much  Periodic numbness in toes, seems improved.  Left Plantar Fasciitis Followed by [facility] - Dr Luana Shu He has done evaluation with X-ray and Vascular ABI study, negative imaging Improved overall, has episodic flares He can return for injection if need Wears supports / inserts  Insomnia Failed Melatonin, Trazodone He has difficulty sleep maintenance, wakes up 4-5am often, he prefers to avoid medication  Health Maintenance: Future consider Shingrix TDap vaccines      10/04/2021    8:11 AM 03/20/2021   10:43 AM 10/02/2020    9:01 AM  Depression screen PHQ 2/9  Decreased Interest 0 0  0  Down, Depressed, Hopeless 0 0 0  PHQ - 2 Score 0 0 0  Altered sleeping  2   Tired, decreased energy  0   Change in appetite  0   Feeling bad or failure about yourself   0   Trouble concentrating  0   Moving slowly or fidgety/restless  0   Suicidal thoughts  0   PHQ-9 Score  2   Difficult doing work/chores  Not difficult at all     Social History   Tobacco Use   Smoking status: Never   Smokeless tobacco: Never  Vaping Use   Vaping Use: Never used  Substance Use Topics   Alcohol use: No   Drug use: No    Review of Systems Per HPI unless specifically indicated above     Objective:    BP 136/88   Pulse (!) 49   Ht 6' (1.829 m)   Wt 181 lb 9.6 oz (82.4 kg)   SpO2 100%   BMI 24.63 kg/m   Wt Readings from Last 3 Encounters:  09/15/22 181 lb 9.6 oz (82.4 kg)  07/31/22 182 lb (82.6 kg)  05/19/22 180 lb (81.6 kg)    Physical Exam Vitals and nursing note reviewed.  Constitutional:      General: He is not in acute distress.    Appearance: He is well-developed. He is not diaphoretic.     Comments: Well-appearing, comfortable, cooperative  HENT:     Head: Normocephalic and atraumatic.  Eyes:     General:  Right eye: No discharge.        Left eye: No discharge.     Conjunctiva/sclera: Conjunctivae normal.  Neck:     Thyroid: No thyromegaly.  Cardiovascular:     Rate and Rhythm: Normal rate and regular rhythm.     Pulses: Normal pulses.     Heart sounds: Normal heart sounds. No murmur heard. Pulmonary:     Effort: Pulmonary effort is normal. No respiratory distress.     Breath sounds: Normal breath sounds. No wheezing or rales.  Musculoskeletal:        General: Normal range of motion.     Cervical back: Normal range of motion and neck supple.  Lymphadenopathy:     Cervical: No cervical adenopathy.  Skin:    General: Skin is warm and dry.     Findings: No erythema or rash.  Neurological:     Mental Status: He is alert and oriented to person, place,  and time. Mental status is at baseline.  Psychiatric:        Behavior: Behavior normal.     Comments: Well groomed, good eye contact, normal speech and thoughts     I have personally reviewed the radiology report from 08/11/22 on L Ankle Xray.  Xrays: Left foot 3 views AP, lateral, lateral oblique: No acute fracture or dislocation present. Minimal spurring present to the posterior and plantar calcaneus. Arterial calcification noted throughout the foot and ankle. Slight planus foot structure.   LOWER EXTREMITY DOPPLER STUDY   Patient Name:  Jesse Figueroa  Date of Exam:   08/20/2022  Medical Rec #: [id number]       Accession #:    [id number]  Date of Birth: 07-Oct-1950        Patient Gender: M  Patient Age:   64 years  Exam Location:  [facility]  Procedure:      VAS [place] ABI WITH/WO TBI  Referring Phys:    ---------------------------------------------------------------------------    Other Factors: Clinically difficult palpability of pedal pulses.   Performing Technologist: Concha Norway RVT     Examination Guidelines: A complete evaluation includes at minimum, Doppler  waveform signals and systolic blood pressure reading at the level of  bilateral  brachial, anterior tibial, and posterior tibial arteries, when vessel  segments  are accessible. Bilateral testing is considered an integral part of a  complete  examination. Photoelectric Plethysmograph (PPG) waveforms and toe systolic  pressure readings are included as required and additional duplex testing  as  needed. Limited examinations for reoccurring indications may be performed  as  noted.     ABI Findings:  +---------+------------------+-----+--------+--------+  Right   Rt Pressure (mmHg)IndexWaveformComment   +---------+------------------+-----+--------+--------+  Brachial 140                                      +---------+------------------+-----+--------+--------+  ATA     162                1.16 biphasic          +---------+------------------+-----+--------+--------+  PTA     159               1.14 biphasic          +---------+------------------+-----+--------+--------+  Great Toe127               0.91 Normal            +---------+------------------+-----+--------+--------+   +---------+------------------+-----+---------+-------+  Left    Lt Pressure (mmHg)IndexWaveform Comment  +---------+------------------+-----+---------+-------+  Brachial 140                                      +---------+------------------+-----+---------+-------+  ATA     150               1.07 biphasic          +---------+------------------+-----+---------+-------+  PTA     153               1.09 triphasic         +---------+------------------+-----+---------+-------+  Great Toe133               0.95 Normal            +---------+------------------+-----+---------+-------+     Summary:  Right: Resting right ankle-brachial index is within normal range. The  right toe-brachial index is normal.   Left: Resting left ankle-brachial index is within normal range. The left  toe-brachial index is normal.    *See table(s) above for measurements and observations.      Electronically signed by Leotis Pain MD on 08/25/2022 at 8:21:56 AM.        Final     Recent Labs    03/13/22 0940 09/15/22 0847  HGBA1C 6.4* 6.7*     Results for orders placed or performed in visit on 09/15/22  POCT glycosylated hemoglobin (Hb A1C)  Result Value Ref Range   Hemoglobin A1C 6.7 (A) 4.0 - 5.6 %      Assessment & Plan:   Problem List Items Addressed This Visit     Benign hypertension with CKD (chronic kidney disease), stage II - Primary    Mild elevation but overall controlled Complication with CKD-II  Plan:  1. Continue current BP regimen Losartan 100mg  daily, Propranolol 20mg  BID 2. Encourage improved lifestyle - low sodium diet, regular exercise 3.  Continue monitor BP outside office, bring readings to next visit, if persistently >140/90 or new symptoms notify office sooner      Insomnia    Seems primary chronic insomnia, more sleep maintenance Does not seem consistent with depression anxiety or behavioral health, also seems like stays active and appropriate sleep hygiene except often watching TV to help sleep. - May be more behavioral - Failed Trazodone in past, low dose 50mg . Not improve sleep hygiene  Plan Currently off of all medication, no OTC or rx.  Follow-up specifically for this issue if not improving in future - may offer rx medication Remeron vs hypnotic such as Ambien if needed for short-term he has concerns habit forming, would cautiously approach rx      Pre-diabetes    A1c 6.7 today notable increase from previous Prior Range 6.3 to 6.5 at risk of progression to DM Concern with HTN. Fam history of DM  Plan:  1. Not on any therapy currently 2. Encourage improved lifestyle - low carb, low sugar diet, reduce portion size, continue improving regular exercise He plans to limit daily ice cream 6 mo      Relevant Orders   POCT glycosylated hemoglobin (Hb A1C) (Completed)   Other Visit Diagnoses     Plantar fasciitis, left           L Plantar Fasciitis Followed by Podiatry Negative ABI, X-ray Treated with inserts/insoles Future injection if needed   No orders of the defined types were placed in this encounter.  Follow up plan: Return in about 6 months (around 03/17/2023) for 6 month fasting lab only then 1 week later Annual Physical.  Future labs ordered 02/2023  [name], [facility] 09/15/2022, 8:12 AM

## 2022-10-08 ENCOUNTER — Other Ambulatory Visit: Payer: Self-pay | Admitting: Family Medicine

## 2022-10-08 DIAGNOSIS — F5101 Primary insomnia: Secondary | ICD-10-CM

## 2022-10-08 NOTE — Telephone Encounter (Signed)
Unable to refill per protocol, Rx expired. Discontinued 09/15/22, side effects.  Requested Prescriptions  Pending Prescriptions Disp Refills   traZODone (DESYREL) 100 MG tablet [Pharmacy Med Name: TRAZODONE 100 MG TABLET] 90 tablet 1    Sig: TAKE 1 TABLET BY MOUTH EVERYDAY AT BEDTIME     Psychiatry: Antidepressants - Serotonin Modulator Passed - 10/08/2022  1:37 AM      Passed - Valid encounter within last 6 months    Recent Outpatient Visits           3 weeks ago Benign hypertension with CKD (chronic kidney disease), stage II   [place] [facility] [place], Netta Neat, DO   2 months ago Plantar fasciitis, left   [place] [facility] [place], Netta Neat, DO   6 months ago Annual physical exam   [place] [facility] Smitty Cords, DO   1 year ago SK (seborrheic keratosis)   [place] [facility] Smitty Cords, DO   1 year ago Bilateral shoulder bursitis   [place] [facility] Smitty Cords, DO       Future Appointments             In 5 months Althea Charon, Netta Neat, DO [facility], [facility]

## 2022-10-10 ENCOUNTER — Ambulatory Visit (INDEPENDENT_AMBULATORY_CARE_PROVIDER_SITE_OTHER): Payer: No Typology Code available for payment source

## 2022-10-10 VITALS — BP 110/72 | Ht 72.0 in | Wt 179.8 lb

## 2022-10-10 DIAGNOSIS — Z0000 Encounter for general adult medical examination without abnormal findings: Secondary | ICD-10-CM | POA: Diagnosis not present

## 2022-10-10 NOTE — Patient Instructions (Signed)
Jesse Figueroa , Thank you for taking time to come for your Medicare Wellness Visit. I appreciate your ongoing commitment to your health goals. Please review the following plan we discussed and let me know if I can assist you in the future.   These are the goals we discussed:  Goals      Cut out extra servings     DIET - EAT MORE FRUITS AND VEGETABLES        This is a list of the screening recommended for you and due dates:  Health Maintenance  Topic Date Due   Zoster (Shingles) Vaccine (1 of 2) Never done   DTaP/Tdap/Td vaccine (2 - Td or Tdap) 06/16/2017   COVID-19 Vaccine (7 - 2023-24 season) 05/30/2022   Flu Shot  01/15/2023   Medicare Annual Wellness Visit  10/10/2023   Colon Cancer Screening  05/13/2026   Pneumonia Vaccine  Completed   Hepatitis C Screening: USPSTF Recommendation to screen - Ages 18-79 yo.  Completed   HPV Vaccine  Aged Out    Advanced directives: no  Conditions/risks identified: none  Next appointment: Follow up in one year for your annual wellness visit. 10/16/23 @ 8:15 am in person  Preventive Care 65 Years and Older, Male  Preventive care refers to lifestyle choices and visits with your health care provider that can promote health and wellness. What does preventive care include? A yearly physical exam. This is also called an annual well check. Dental exams once or twice a year. Routine eye exams. Ask your health care provider how often you should have your eyes checked. Personal lifestyle choices, including: Daily care of your teeth and gums. Regular physical activity. Eating a healthy diet. Avoiding tobacco and drug use. Limiting alcohol use. Practicing safe sex. Taking low doses of aspirin every day. Taking vitamin and mineral supplements as recommended by your health care provider. What happens during an annual well check? The services and screenings done by your health care provider during your annual well check will depend on your age, overall  health, lifestyle risk factors, and family history of disease. Counseling  Your health care provider may ask you questions about your: Alcohol use. Tobacco use. Drug use. Emotional well-being. Home and relationship well-being. Sexual activity. Eating habits. History of falls. Memory and ability to understand (cognition). Work and work Astronomer. Screening  You may have the following tests or measurements: Height, weight, and BMI. Blood pressure. Lipid and cholesterol levels. These may be checked every 5 years, or more frequently if you are over 46 years old. Skin check. Lung cancer screening. You may have this screening every year starting at age 24 if you have a 30-pack-year history of smoking and currently smoke or have quit within the past 15 years. Fecal occult blood test (FOBT) of the stool. You may have this test every year starting at age 78. Flexible sigmoidoscopy or colonoscopy. You may have a sigmoidoscopy every 5 years or a colonoscopy every 10 years starting at age 36. Prostate cancer screening. Recommendations will vary depending on your family history and other risks. Hepatitis C blood test. Hepatitis B blood test. Sexually transmitted disease (STD) testing. Diabetes screening. This is done by checking your blood sugar (glucose) after you have not eaten for a while (fasting). You may have this done every 1-3 years. Abdominal aortic aneurysm (AAA) screening. You may need this if you are a current or former smoker. Osteoporosis. You may be screened starting at age 71 if you are at  high risk. Talk with your health care provider about your test results, treatment options, and if necessary, the need for more tests. Vaccines  Your health care provider may recommend certain vaccines, such as: Influenza vaccine. This is recommended every year. Tetanus, diphtheria, and acellular pertussis (Tdap, Td) vaccine. You may need a Td booster every 10 years. Zoster vaccine. You may  need this after age 68. Pneumococcal 13-valent conjugate (PCV13) vaccine. One dose is recommended after age 74. Pneumococcal polysaccharide (PPSV23) vaccine. One dose is recommended after age 52. Talk to your health care provider about which screenings and vaccines you need and how often you need them. This information is not intended to replace advice given to you by your health care provider. Make sure you discuss any questions you have with your health care provider. Document Released: 06/29/2015 Document Revised: 02/20/2016 Document Reviewed: 04/03/2015 Elsevier Interactive Patient Education  2017 [place] Prevention in the Home Falls can cause injuries. They can happen to people of all ages. There are many things you can do to make your home safe and to help prevent falls. What can I do on the outside of my home? Regularly fix the edges of walkways and driveways and fix any cracks. Remove anything that might make you trip as you walk through a door, such as a raised step or threshold. Trim any bushes or trees on the path to your home. Use bright outdoor lighting. Clear any walking paths of anything that might make someone trip, such as rocks or tools. Regularly check to see if handrails are loose or broken. Make sure that both sides of any steps have handrails. Any raised decks and porches should have guardrails on the edges. Have any leaves, snow, or ice cleared regularly. Use sand or salt on walking paths during winter. Clean up any spills in your garage right away. This includes oil or grease spills. What can I do in the bathroom? Use night lights. Install grab bars by the toilet and in the tub and shower. Do not use towel bars as grab bars. Use non-skid mats or decals in the tub or shower. If you need to sit down in the shower, use a plastic, non-slip stool. Keep the floor dry. Clean up any water that spills on the floor as soon as it happens. Remove soap buildup in  the tub or shower regularly. Attach bath mats securely with double-sided non-slip rug tape. Do not have throw rugs and other things on the floor that can make you trip. What can I do in the bedroom? Use night lights. Make sure that you have a light by your bed that is easy to reach. Do not use any sheets or blankets that are too big for your bed. They should not hang down onto the floor. Have a firm chair that has side arms. You can use this for support while you get dressed. Do not have throw rugs and other things on the floor that can make you trip. What can I do in the kitchen? Clean up any spills right away. Avoid walking on wet floors. Keep items that you use a lot in easy-to-reach places. If you need to reach something above you, use a strong step stool that has a grab bar. Keep electrical cords out of the way. Do not use floor polish or wax that makes floors slippery. If you must use wax, use non-skid floor wax. Do not have throw rugs and other things on the floor that  can make you trip. What can I do with my stairs? Do not leave any items on the stairs. Make sure that there are handrails on both sides of the stairs and use them. Fix handrails that are broken or loose. Make sure that handrails are as long as the stairways. Check any carpeting to make sure that it is firmly attached to the stairs. Fix any carpet that is loose or worn. Avoid having throw rugs at the top or bottom of the stairs. If you do have throw rugs, attach them to the floor with carpet tape. Make sure that you have a light switch at the top of the stairs and the bottom of the stairs. If you do not have them, ask someone to add them for you. What else can I do to help prevent falls? Wear shoes that: Do not have high heels. Have rubber bottoms. Are comfortable and fit you well. Are closed at the toe. Do not wear sandals. If you use a stepladder: Make sure that it is fully opened. Do not climb a closed  stepladder. Make sure that both sides of the stepladder are locked into place. Ask someone to hold it for you, if possible. Clearly mark and make sure that you can see: Any grab bars or handrails. First and last steps. Where the edge of each step is. Use tools that help you move around (mobility aids) if they are needed. These include: Canes. Walkers. Scooters. Crutches. Turn on the lights when you go into a dark area. Replace any light bulbs as soon as they burn out. Set up your furniture so you have a clear path. Avoid moving your furniture around. If any of your floors are uneven, fix them. If there are any pets around you, be aware of where they are. Review your medicines with your doctor. Some medicines can make you feel dizzy. This can increase your chance of falling. Ask your doctor what other things that you can do to help prevent falls. This information is not intended to replace advice given to you by your health care provider. Make sure you discuss any questions you have with your health care provider. Document Released: 03/29/2009 Document Revised: 11/08/2015 Document Reviewed: 07/07/2014 Elsevier Interactive Patient Education  2017 [employer].

## 2022-10-10 NOTE — Progress Notes (Signed)
Subjective:   Jesse Figueroa is a 72 y.o. male who presents for Medicare Annual/Subsequent preventive examination.  Review of Systems     Cardiac Risk Factors include: advanced age (>5men, >41 women);hypertension;male gender     Objective:    Today's Vitals   10/10/22 0806  BP: 110/72  Weight: 179 lb 12.8 oz (81.6 kg)  Height: 6' (1.829 m)  PainSc: 3    Body mass index is 24.39 kg/m.     10/10/2022    8:11 AM 10/04/2021    8:13 AM 05/13/2021    6:58 AM 10/02/2020    9:00 AM 02/08/2019    3:47 PM 09/15/2017   10:16 AM 04/09/2015   10:14 AM  Advanced Directives  Does Patient Have a Medical Advance Directive? No No Yes Yes Yes Yes Yes  Type of Theme park manager;Living will Living will;Healthcare Power of Attorney Living will Living will  Copy of Healthcare Power of Attorney in Chart?    No - copy requested No - copy requested  No - copy requested  Would patient like information on creating a medical advance directive? No - Patient declined No - Patient declined         Current Medications (verified) Outpatient Encounter Medications as of 10/10/2022  Medication Sig   aspirin 81 MG chewable tablet Chew by mouth.   atorvastatin (LIPITOR) 10 MG tablet Take 1 tablet (10 mg total) by mouth daily.   calcium carbonate (TUMS - DOSED IN MG ELEMENTAL CALCIUM) 500 MG chewable tablet Chew 1 tablet by mouth daily as needed for indigestion or heartburn.   diclofenac Sodium (VOLTAREN) 1 % GEL Apply 2 g topically 4 (four) times daily as needed (joint pain arthritis shoulder).   fluticasone (FLONASE) 50 MCG/ACT nasal spray USE 2 SPRAYS IN BOTH NOSTRILS DAILY. USE FOR 4 TO 6 WEEKS THEN STOP AND USE SEASONALLY OR AS NEEDED.   losartan (COZAAR) 100 MG tablet Take 1 tablet (100 mg total) by mouth daily.   propranolol (INDERAL) 20 MG tablet Take 1 tablet (20 mg total) by mouth 2 (two) times daily.   No facility-administered encounter medications on file as of  10/10/2022.    Allergies (verified) Patient has no known allergies.   History: Past Medical History:  Diagnosis Date   Age-related nuclear cataract of both eyes 05/11/2018   Arthritis of hand    Benign hypertension with CKD (chronic kidney disease), stage II    CKD (chronic kidney disease), stage II    Hyperlipidemia    Hypertension    Tremor    Past Surgical History:  Procedure Laterality Date   COLONOSCOPY N/A 05/13/2021   Procedure: COLONOSCOPY;  Surgeon: Regis Bill, MD;  Location: [facility] ENDOSCOPY;  Service: Endoscopy;  Laterality: N/A;   COLONOSCOPY WITH PROPOFOL N/A 02/05/2016   Procedure: COLONOSCOPY WITH PROPOFOL;  Surgeon: Christena Deem, MD;  Location: [facility] ENDOSCOPY;  Service: Endoscopy;  Laterality: N/A;   EYE SURGERY  Lasic   Family History  Problem Relation Age of Onset   Heart disease Mother    Hypertension Mother    Diabetes Mother    Cancer Father    Social History   Socioeconomic History   Marital status: Married    Spouse name: Not on file   Number of children: Not on file   Years of education: Not on file   Highest education level: Not on file  Occupational History   Occupation: Development worker, community  Comment: [employer]  Tobacco Use   Smoking status: Never   Smokeless tobacco: Never  Vaping Use   Vaping Use: Never used  Substance and Sexual Activity   Alcohol use: No   Drug use: No   Sexual activity: Yes  Other Topics Concern   Not on file  Social History Narrative   Track coach 5 days a week    Social Determinants of Health   Financial Resource Strain: Low Risk  (10/10/2022)   Overall Financial Resource Strain (CARDIA)    Difficulty of Paying Living Expenses: Not hard at all  Food Insecurity: No Food Insecurity (10/10/2022)   Hunger Vital Sign    Worried About Running Out of Food in the Last Year: Never true    Ran Out of Food in the Last Year: Never true  Transportation Needs: No Transportation Needs  (10/10/2022)   PRAPARE - Administrator, Civil Service (Medical): No    Lack of Transportation (Non-Medical): No  Physical Activity: Sufficiently Active (10/10/2022)   Exercise Vital Sign    Days of Exercise per Week: 5 days    Minutes of Exercise per Session: 60 min  Stress: No Stress Concern Present (10/10/2022)   [employer] of Occupational Health - Occupational Stress Questionnaire    Feeling of Stress : Not at all  Social Connections: Unknown (10/10/2022)   Social Connection and Isolation Panel [NHANES]    Frequency of Communication with Friends and Family: More than three times a week    Frequency of Social Gatherings with Friends and Family: Once a week    Attends Religious Services: Patient declined    Database administrator or Organizations: Patient declined    Attends Banker Meetings: Never    Marital Status: Married    Tobacco Counseling Counseling given: Not Answered   Clinical Intake:  Pre-visit preparation completed: Yes  Pain : 0-10 Pain Score: 3  Pain Location: Heel Pain Orientation: Left     Nutritional Risks: None Diabetes: No  How often do you need to have someone help you when you read instructions, pamphlets, or other written materials from your doctor or pharmacy?: 1 - Never  Diabetic?no  Interpreter Needed?: No  Information entered by :: Kennedy Bucker, LPN   Activities of Daily Living    10/10/2022    8:12 AM 10/06/2022    7:52 PM  In your present state of health, do you have any difficulty performing the following activities:  Hearing? 0 0  Vision? 0 0  Difficulty concentrating or making decisions? 0 0  Walking or climbing stairs? 0 0  Dressing or bathing? 0 0  Doing errands, shopping? 0 0  Preparing Food and eating ? N N  Using the Toilet? N N  In the past six months, have you accidently leaked urine? N N  Do you have problems with loss of bowel control? N N  Managing your Medications? N N  Managing  your Finances? N N  Housekeeping or managing your Housekeeping? N N    Patient Care Team: Smitty Cords, DO as PCP - General (Family Medicine)  Indicate any recent Medical Services you may have received from other than [facility] providers in the past year (date may be approximate).     Assessment:   This is a routine wellness examination for Jesse Figueroa.  Hearing/Vision screen Hearing Screening - Comments:: No aids Vision Screening - Comments:: Readers- Dr.Thurmond  Dietary issues and exercise activities discussed: Current Exercise  Habits: Home exercise routine, Type of exercise: walking, Time (Minutes): 60, Frequency (Times/Week): 5, Weekly Exercise (Minutes/Week): 300, Intensity: Mild   Goals Addressed             This Visit's Progress    Cut out extra servings         Depression Screen    10/10/2022    8:09 AM 09/15/2022    9:00 AM 10/04/2021    8:11 AM 03/20/2021   10:43 AM 10/02/2020    9:01 AM 11/25/2019    9:46 AM 10/27/2019    9:55 AM  PHQ 2/9 Scores  PHQ - 2 Score 0 0 0 0 0 0 0  PHQ- 9 Score 0 3  2       Fall Risk    10/10/2022    8:11 AM 10/06/2022    7:52 PM 09/15/2022    9:00 AM 10/04/2021    8:14 AM 03/20/2021   10:41 AM  Fall Risk   Falls in the past year? 0 0 0 0 0  Number falls in past yr: 0 0 0 0 0  Injury with Fall? 0 0 0 0 0  Risk for fall due to : No Fall Risks  No Fall Risks    Follow up Falls prevention discussed;Falls evaluation completed  Falls evaluation completed Falls evaluation completed Falls evaluation completed    FALL RISK PREVENTION PERTAINING TO THE HOME:  Any stairs in or around the home? Yes  If so, are there any without handrails? No  Home free of loose throw rugs in walkways, pet beds, electrical cords, etc? Yes  Adequate lighting in your home to reduce risk of falls? Yes   ASSISTIVE DEVICES UTILIZED TO PREVENT FALLS:  Life alert? No  Use of a cane, walker or w/c? No  Grab bars in the bathroom? Yes  Shower chair or  bench in shower? No  Elevated toilet seat or a handicapped toilet? No   TIMED UP AND GO:  Was the test performed? Yes .  Length of time to ambulate 10 feet: 4 sec.   Gait steady and fast without use of assistive device  Cognitive Function:        10/10/2022    8:14 AM 10/02/2020    9:02 AM 02/08/2019    3:52 PM 09/15/2017   10:20 AM  6CIT Screen  What Year? 0 points 0 points 0 points 0 points  What month? 0 points 0 points 0 points 0 points  What time? 0 points 0 points 0 points 0 points  Count back from 20 0 points 0 points 0 points 0 points  Months in reverse 0 points 0 points 0 points 0 points  Repeat phrase 0 points 2 points 0 points 2 points  Total Score 0 points 2 points 0 points 2 points    Immunizations Immunization History  Administered Date(s) Administered   COVID-19, mRNA, vaccine(Comirnaty)12 years and older 04/04/2022   Fluad Quad(high Dose 65+) 02/08/2019, 03/27/2020, 03/20/2021, 03/13/2022   Influenza, High Dose Seasonal PF 03/06/2016, 06/17/2017   Influenza,inj,quad, With Preservative 03/16/2016   Influenza-Unspecified 03/16/2013, 04/07/2015, 02/04/2018   PFIZER(Purple Top)SARS-COV-2 Vaccination 07/18/2019, 08/08/2019, 04/04/2020, 11/19/2020   Pfizer Covid-19 Vaccine Bivalent Booster 85yrs & up 04/02/2021   Pneumococcal Conjugate-13 04/09/2015   Pneumococcal Polysaccharide-23 05/01/2016   Pneumococcal-Unspecified 03/16/2016   Tdap 06/17/2007   Zoster, Live 06/16/2013    TDAP status: Due, Education has been provided regarding the importance of this vaccine. Advised may receive this vaccine at local  pharmacy or Health Dept. Aware to provide a copy of the vaccination record if obtained from local pharmacy or Health Dept. Verbalized acceptance and understanding.  Flu Vaccine status: Up to date  Pneumococcal vaccine status: Up to date  Covid-19 vaccine status: Completed vaccines  Qualifies for Shingles Vaccine? Yes   Zostavax completed Yes   Shingrix  Completed?: No.    Education has been provided regarding the importance of this vaccine. Patient has been advised to call insurance company to determine out of pocket expense if they have not yet received this vaccine. Advised may also receive vaccine at local pharmacy or Health Dept. Verbalized acceptance and understanding.  Screening Tests Health Maintenance  Topic Date Due   Zoster Vaccines- Shingrix (1 of 2) Never done   DTaP/Tdap/Td (2 - Td or Tdap) 06/16/2017   COVID-19 Vaccine (7 - 2023-24 season) 05/30/2022   INFLUENZA VACCINE  01/15/2023   Medicare Annual Wellness (AWV)  10/10/2023   COLONOSCOPY (Pts 45-77yrs Insurance coverage will need to be confirmed)  05/13/2026   Pneumonia Vaccine 14+ Years old  Completed   Hepatitis C Screening  Completed   HPV VACCINES  Aged Out    Health Maintenance  Health Maintenance Due  Topic Date Due   Zoster Vaccines- Shingrix (1 of 2) Never done   DTaP/Tdap/Td (2 - Td or Tdap) 06/16/2017   COVID-19 Vaccine (7 - 2023-24 season) 05/30/2022    Colorectal cancer screening: No longer required.   Lung Cancer Screening: (Low Dose CT Chest recommended if Age 75-80 years, 30 pack-year currently smoking OR have quit w/in 15years.) does not qualify.   Additional Screening:  Hepatitis C Screening: does qualify; Completed 09/15/17  Vision Screening: Recommended annual ophthalmology exams for early detection of glaucoma and other disorders of the eye. Is the patient up to date with their annual eye exam?  Yes  Who is the provider or what is the name of the office in which the patient attends annual eye exams? Dr.Thurmond If pt is not established with a provider, would they like to be referred to a provider to establish care? No .   Dental Screening: Recommended annual dental exams for proper oral hygiene  Community Resource Referral / Chronic Care Management: CRR required this visit?  No   CCM required this visit?  No      Plan:     I have  personally reviewed and noted the following in the patient's chart:   Medical and social history Use of alcohol, tobacco or illicit drugs  Current medications and supplements including opioid prescriptions. Patient is not currently taking opioid prescriptions. Functional ability and status Nutritional status Physical activity Advanced directives List of other physicians Hospitalizations, surgeries, and ER visits in previous 12 months Vitals Screenings to include cognitive, depression, and falls Referrals and appointments  In addition, I have reviewed and discussed with patient certain preventive protocols, quality metrics, and best practice recommendations. A written personalized care plan for preventive services as well as general preventive health recommendations were provided to patient.     Hal Hope, LPN   09/22/8117   Nurse Notes: none

## 2022-12-23 ENCOUNTER — Encounter: Payer: Self-pay | Admitting: Family Medicine

## 2022-12-23 ENCOUNTER — Ambulatory Visit (INDEPENDENT_AMBULATORY_CARE_PROVIDER_SITE_OTHER): Payer: No Typology Code available for payment source | Admitting: Family Medicine

## 2022-12-23 VITALS — BP 130/72 | HR 58 | Temp 98.2°F | Resp 17 | Ht 72.0 in | Wt 168.0 lb

## 2022-12-23 DIAGNOSIS — M722 Plantar fascial fibromatosis: Secondary | ICD-10-CM | POA: Diagnosis not present

## 2022-12-23 DIAGNOSIS — R0989 Other specified symptoms and signs involving the circulatory and respiratory systems: Secondary | ICD-10-CM | POA: Diagnosis not present

## 2022-12-23 DIAGNOSIS — J0110 Acute frontal sinusitis, unspecified: Secondary | ICD-10-CM

## 2022-12-23 MED ORDER — AZITHROMYCIN 250 MG PO TABS
ORAL_TABLET | ORAL | 0 refills | Status: AC
Start: 2022-12-23 — End: ?

## 2022-12-23 MED ORDER — IPRATROPIUM BROMIDE 0.06 % NA SOLN
2.0000 | Freq: Four times a day (QID) | NASAL | 0 refills | Status: DC
Start: 2022-12-23 — End: 2023-01-15

## 2022-12-23 NOTE — Patient Instructions (Addendum)
Thank you for coming to the office today.  Keep on Coricidin  Start Atrovent nasal spray decongestant 2 sprays in each nostril up to 4 times daily for 7 days  If not improved within 48 hours Start Azithromycin Z pak (antibiotic) 2 tabs day 1, then 1 tab x 4 days, complete entire course even if improved  Follow up with Dr Excell Seltzer Podiatry on 7/22 to discuss injection for foot pain  Please schedule a Follow-up Appointment to: Return if symptoms worsen or fail to improve.  If you have any other questions or concerns, please feel free to call the office or send a message through MyChart. You may also schedule an earlier appointment if necessary.  Additionally, you may be receiving a survey about your experience at our office within a few days to 1 week by e-mail or mail. We value your feedback.  Saralyn Pilar, DO [facility], [place]

## 2022-12-23 NOTE — Progress Notes (Signed)
Subjective:    [name], male    DOB: December 21, 1950, 72 y.o.   MRN: [id number]  Jesse Figueroa is a 72 y.o. male presenting on 12/23/2022 for Foot Pain (Pt complain of left foot pain that worsen when applying pressure ) and Cough (Coughing, nasal drainage, sore throat x 5 days )   HPI  Sinusitis Chest Congestion Reports onset 5 days ago Using Coricidin with relief No sick contact Denies fever chills nausea vomiting    Left Plantar Fasciitis Followed by [facility]med - Dr Excell Seltzer He has done evaluation with X-ray and Vascular ABI study, negative imaging He has tried treatments conservative care rx / OTC without significant relief Wears supports / inserts  Last visit with me 09/2022, continued conservative care He was seen by Podiatry previously last 07/2022 Has next apt 7/22      12/23/2022    1:20 PM 10/10/2022    8:09 AM 09/15/2022    9:00 AM  Depression screen PHQ 2/9  Decreased Interest 0 0 0  Down, Depressed, Hopeless 0 0 0  PHQ - 2 Score 0 0 0  Altered sleeping 1 0 3  Tired, decreased energy 2 0 0  Change in appetite 0 0 0  Feeling bad or failure about yourself  0 0 0  Trouble concentrating 0 0 0  Moving slowly or fidgety/restless 0 0 0  Suicidal thoughts 0 0 0  PHQ-9 Score 3 0 3  Difficult doing work/chores Not difficult at all Not difficult at all Not difficult at all    Social History   Tobacco Use   Smoking status: Never   Smokeless tobacco: Never  Vaping Use   Vaping Use: Never used  Substance Use Topics   Alcohol use: No   Drug use: No    Review of Systems Per HPI unless specifically indicated above     Objective:    BP 130/72 (BP Location: Left Arm, Patient Position: Sitting, Cuff Size: Normal)   Pulse (!) 58   Temp 98.2 F (36.8 C) (Oral)   Resp 17   Ht 6' (1.829 m)   Wt 168 lb (76.2 kg)   SpO2 100%   BMI 22.78 kg/m   Wt Readings from Last 3 Encounters:  12/23/22 168 lb (76.2 kg)  10/10/22 179 lb 12.8 oz (81.6 kg)   09/15/22 181 lb 9.6 oz (82.4 kg)    Physical Exam Vitals and nursing note reviewed.  Constitutional:      General: He is not in acute distress.    Appearance: Normal appearance. He is well-developed. He is not diaphoretic.     Comments: Well-appearing, comfortable, cooperative  HENT:     Head: Normocephalic and atraumatic.     Nose: Congestion present.  Eyes:     General:        Right eye: No discharge.        Left eye: No discharge.     Conjunctiva/sclera: Conjunctivae normal.  Cardiovascular:     Rate and Rhythm: Normal rate.  Pulmonary:     Effort: Pulmonary effort is normal.     Breath sounds: No stridor. No wheezing, rhonchi or rales.     Comments: Mild coarse breath sounds Skin:    General: Skin is warm and dry.     Findings: No erythema or rash.  Neurological:     Mental Status: He is alert and oriented to person, place, and time.  Psychiatric:  Mood and Affect: Mood normal.        Behavior: Behavior normal.        Thought Content: Thought content normal.     Comments: Well groomed, good eye contact, normal speech and thoughts      Results for orders placed or performed in visit on 09/15/22  POCT glycosylated hemoglobin (Hb A1C)  Result Value Ref Range   Hemoglobin A1C 6.7 (A) 4.0 - 5.6 %      Assessment & Plan:   Problem List Items Addressed This Visit   None Visit Diagnoses     Acute non-recurrent frontal sinusitis    -  Primary   Relevant Medications   azithromycin (ZITHROMAX Z-PAK) 250 MG tablet   ipratropium (ATROVENT) 0.06 % nasal spray   Chest congestion       Plantar fasciitis, left          Sinusitis  Keep on Coricidin  Start Atrovent nasal spray decongestant 2 sprays in each nostril up to 4 times daily for 7 days  If not improved within 48 hours Start Azithromycin Z pak (antibiotic) 2 tabs day 1, then 1 tab x 4 days, complete entire course even if improved  L Plantar Fasciitis, chronic pain Limited results on conservative  therapy thus far. Limited options left at this point I agree w/ Podiatry return for consider injection  Follow up with Dr Excell Seltzer Podiatry on 7/22 to discuss injection for foot pain   Meds ordered this encounter  Medications   azithromycin (ZITHROMAX Z-PAK) 250 MG tablet    Sig: Take 2 tabs (500mg  total) on Day 1. Take 1 tab (250mg ) daily for next 4 days.    Dispense:  6 tablet    Refill:  0   ipratropium (ATROVENT) 0.06 % nasal spray    Sig: Place 2 sprays into both nostrils 4 (four) times daily. For up to 5-7 days then stop.    Dispense:  15 mL    Refill:  0      Follow up plan: Return if symptoms worsen or fail to improve.  Saralyn Pilar, DO [facility] 12/23/2022, 1:23 PM

## 2023-01-05 DIAGNOSIS — M216X2 Other acquired deformities of left foot: Secondary | ICD-10-CM | POA: Diagnosis not present

## 2023-01-05 DIAGNOSIS — E119 Type 2 diabetes mellitus without complications: Secondary | ICD-10-CM | POA: Diagnosis not present

## 2023-01-05 DIAGNOSIS — M79672 Pain in left foot: Secondary | ICD-10-CM | POA: Diagnosis not present

## 2023-01-05 DIAGNOSIS — M722 Plantar fascial fibromatosis: Secondary | ICD-10-CM | POA: Diagnosis not present

## 2023-01-05 DIAGNOSIS — M216X1 Other acquired deformities of right foot: Secondary | ICD-10-CM | POA: Diagnosis not present

## 2023-01-07 DIAGNOSIS — H524 Presbyopia: Secondary | ICD-10-CM | POA: Diagnosis not present

## 2023-01-07 DIAGNOSIS — H40023 Open angle with borderline findings, high risk, bilateral: Secondary | ICD-10-CM | POA: Diagnosis not present

## 2023-01-14 ENCOUNTER — Other Ambulatory Visit: Payer: Self-pay | Admitting: Family Medicine

## 2023-01-14 DIAGNOSIS — J329 Chronic sinusitis, unspecified: Secondary | ICD-10-CM

## 2023-01-14 DIAGNOSIS — J0110 Acute frontal sinusitis, unspecified: Secondary | ICD-10-CM

## 2023-01-15 MED ORDER — IPRATROPIUM BROMIDE 0.06 % NA SOLN
2.0000 | Freq: Four times a day (QID) | NASAL | 1 refills | Status: AC
Start: 2023-01-15 — End: ?

## 2023-01-15 NOTE — Telephone Encounter (Signed)
Requested medication (s) are due for refill today: Yes  Requested medication (s) are on the active medication list: Yes  Last refill:  12/23/22  Future visit scheduled: Yes  Notes to clinic:  Pharmacy requests 90 day supply and diagnosis code.    Requested Prescriptions  Pending Prescriptions Disp Refills   ipratropium (ATROVENT) 0.06 % nasal spray [Pharmacy Med Name: IPRATROPIUM 0.06% SPRAY]  1    Sig: Place 2 sprays into both nostrils 4 (four) times daily. For up to 5-7 days then stop.     Off-Protocol Failed - 01/14/2023  2:30 PM      Failed - Medication not assigned to a protocol, review manually.      Passed - Valid encounter within last 12 months    Recent Outpatient Visits           3 weeks ago Acute non-recurrent frontal sinusitis   [place] [facility] [place], Netta Neat, DO   4 months ago Benign hypertension with CKD (chronic kidney disease), stage II   [place] [facility] [place], Netta Neat, DO   5 months ago Plantar fasciitis, left   [place] [facility] Smitty Cords, DO   10 months ago Annual physical exam   [place] [facility] Smitty Cords, DO   1 year ago SK (seborrheic keratosis)   [place] [facility] Althea Charon, Netta Neat, DO       Future Appointments             In 2 months Althea Charon, Netta Neat, DO [place] [facility], [facility]           Off-Protocol Failed - 01/14/2023  2:30 PM      Failed - Medication not assigned to a protocol, review manually.      Passed - Valid encounter within last 12 months    Recent Outpatient Visits           3 weeks ago Acute non-recurrent frontal sinusitis   [place] [facility] [place], Netta Neat, DO   4 months ago Benign hypertension with CKD (chronic kidney disease), stage II   [place] [facility] [place],  Netta Neat, DO   5 months ago Plantar fasciitis, left   [place] [facility] Smitty Cords, DO   10 months ago Annual physical exam   [place] [facility] Smitty Cords, DO   1 year ago SK (seborrheic keratosis)   [place] [facility] Althea Charon, Netta Neat, DO       Future Appointments             In 2 months Althea Charon, Netta Neat, DO [place] [facility], [facility]

## 2023-01-15 NOTE — Addendum Note (Signed)
Addended by: Smitty Cords on: 01/15/2023 01:26 PM   Modules accepted: Orders

## 2023-02-09 DIAGNOSIS — M722 Plantar fascial fibromatosis: Secondary | ICD-10-CM | POA: Diagnosis not present

## 2023-02-10 DIAGNOSIS — H40023 Open angle with borderline findings, high risk, bilateral: Secondary | ICD-10-CM | POA: Diagnosis not present

## 2023-03-10 ENCOUNTER — Other Ambulatory Visit: Payer: No Typology Code available for payment source

## 2023-03-10 DIAGNOSIS — R351 Nocturia: Secondary | ICD-10-CM

## 2023-03-10 DIAGNOSIS — Z0000 Encounter for general adult medical examination without abnormal findings: Secondary | ICD-10-CM

## 2023-03-10 DIAGNOSIS — I129 Hypertensive chronic kidney disease with stage 1 through stage 4 chronic kidney disease, or unspecified chronic kidney disease: Secondary | ICD-10-CM | POA: Diagnosis not present

## 2023-03-10 DIAGNOSIS — R7303 Prediabetes: Secondary | ICD-10-CM | POA: Diagnosis not present

## 2023-03-10 DIAGNOSIS — E7849 Other hyperlipidemia: Secondary | ICD-10-CM | POA: Diagnosis not present

## 2023-03-10 DIAGNOSIS — N182 Chronic kidney disease, stage 2 (mild): Secondary | ICD-10-CM | POA: Diagnosis not present

## 2023-03-11 LAB — COMPLETE METABOLIC PANEL WITH GFR
AG Ratio: 1.7 (calc) (ref 1.0–2.5)
ALT: 36 U/L (ref 9–46)
AST: 29 U/L (ref 10–35)
Albumin: 4.4 g/dL (ref 3.6–5.1)
Alkaline phosphatase (APISO): 113 U/L (ref 35–144)
BUN/Creatinine Ratio: 12 (calc) (ref 6–22)
BUN: 16 mg/dL (ref 7–25)
CO2: 29 mmol/L (ref 20–32)
Calcium: 9.7 mg/dL (ref 8.6–10.3)
Chloride: 105 mmol/L (ref 98–110)
Creat: 1.31 mg/dL — ABNORMAL HIGH (ref 0.70–1.28)
Globulin: 2.6 g/dL (calc) (ref 1.9–3.7)
Glucose, Bld: 111 mg/dL — ABNORMAL HIGH (ref 65–99)
Potassium: 4.3 mmol/L (ref 3.5–5.3)
Sodium: 140 mmol/L (ref 135–146)
Total Bilirubin: 0.5 mg/dL (ref 0.2–1.2)
Total Protein: 7 g/dL (ref 6.1–8.1)
eGFR: 57 mL/min/{1.73_m2} — ABNORMAL LOW (ref >=60)

## 2023-03-11 LAB — LIPID PANEL
Cholesterol: 146 mg/dL (ref <200)
HDL: 54 mg/dL (ref >=40)
LDL Cholesterol (Calc): 76 mg/dL (calc)
Non-HDL Cholesterol (Calc): 92 mg/dL (calc) (ref <130)
Total CHOL/HDL Ratio: 2.7 (calc) (ref <5.0)
Triglycerides: 76 mg/dL (ref <150)

## 2023-03-11 LAB — PSA: PSA: 1.4 ng/mL (ref <=4.00)

## 2023-03-11 LAB — CBC WITH DIFFERENTIAL/PLATELET
Absolute Monocytes: 435 cells/uL (ref 200–950)
Basophils Absolute: 29 cells/uL (ref 0–200)
Basophils Relative: 0.7 %
Eosinophils Absolute: 180 cells/uL (ref 15–500)
Eosinophils Relative: 4.4 %
HCT: 45.9 % (ref 38.5–50.0)
Hemoglobin: 14.3 g/dL (ref 13.2–17.1)
Lymphs Abs: 1763 cells/uL (ref 850–3900)
MCH: 27.3 pg (ref 27.0–33.0)
MCHC: 31.2 g/dL — ABNORMAL LOW (ref 32.0–36.0)
MCV: 87.8 fL (ref 80.0–100.0)
MPV: 10.1 fL (ref 7.5–12.5)
Monocytes Relative: 10.6 %
Neutro Abs: 1693 cells/uL (ref 1500–7800)
Neutrophils Relative %: 41.3 %
Platelets: 314 10*3/uL (ref 140–400)
RBC: 5.23 10*6/uL (ref 4.20–5.80)
RDW: 13.6 % (ref 11.0–15.0)
Total Lymphocyte: 43 %
WBC: 4.1 10*3/uL (ref 3.8–10.8)

## 2023-03-11 LAB — HEMOGLOBIN A1C
Hgb A1c MFr Bld: 6.6 % of total Hgb — ABNORMAL HIGH (ref <5.7)
Mean Plasma Glucose: 143 mg/dL
eAG (mmol/L): 7.9 mmol/L

## 2023-03-11 LAB — TSH: TSH: 2.31 mIU/L (ref 0.40–4.50)

## 2023-03-17 ENCOUNTER — Ambulatory Visit: Payer: No Typology Code available for payment source | Admitting: Family Medicine

## 2023-03-17 ENCOUNTER — Encounter: Payer: Self-pay | Admitting: Family Medicine

## 2023-03-17 ENCOUNTER — Other Ambulatory Visit: Payer: Self-pay | Admitting: Family Medicine

## 2023-03-17 VITALS — BP 138/82 | HR 58 | Ht 71.0 in | Wt 172.0 lb

## 2023-03-17 DIAGNOSIS — Z23 Encounter for immunization: Secondary | ICD-10-CM

## 2023-03-17 DIAGNOSIS — N183 Chronic kidney disease, stage 3 unspecified: Secondary | ICD-10-CM

## 2023-03-17 DIAGNOSIS — R251 Tremor, unspecified: Secondary | ICD-10-CM

## 2023-03-17 DIAGNOSIS — R972 Elevated prostate specific antigen [PSA]: Secondary | ICD-10-CM

## 2023-03-17 DIAGNOSIS — R7309 Other abnormal glucose: Secondary | ICD-10-CM

## 2023-03-17 DIAGNOSIS — I10 Essential (primary) hypertension: Secondary | ICD-10-CM | POA: Diagnosis not present

## 2023-03-17 DIAGNOSIS — I129 Hypertensive chronic kidney disease with stage 1 through stage 4 chronic kidney disease, or unspecified chronic kidney disease: Secondary | ICD-10-CM | POA: Diagnosis not present

## 2023-03-17 DIAGNOSIS — E7849 Other hyperlipidemia: Secondary | ICD-10-CM

## 2023-03-17 DIAGNOSIS — G250 Essential tremor: Secondary | ICD-10-CM | POA: Diagnosis not present

## 2023-03-17 DIAGNOSIS — Z0000 Encounter for general adult medical examination without abnormal findings: Secondary | ICD-10-CM

## 2023-03-17 MED ORDER — ATORVASTATIN CALCIUM 10 MG PO TABS
10.0000 mg | ORAL_TABLET | Freq: Every day | ORAL | 3 refills | Status: DC
Start: 2023-03-17 — End: 2024-03-03

## 2023-03-17 MED ORDER — LOSARTAN POTASSIUM 100 MG PO TABS
100.0000 mg | ORAL_TABLET | Freq: Every day | ORAL | 3 refills | Status: DC
Start: 2023-03-17 — End: 2024-03-04

## 2023-03-17 MED ORDER — PROPRANOLOL HCL 20 MG PO TABS
20.0000 mg | ORAL_TABLET | Freq: Two times a day (BID) | ORAL | 3 refills | Status: DC
Start: 2023-03-17 — End: 2023-05-25

## 2023-03-17 NOTE — Assessment & Plan Note (Signed)
Controlled cholesterol on statin and lifestyle Check fasting lipid  Plan: 1. Continue current meds - Atorvastatin 10mg  daily 2. Encourage improved lifestyle - low carb/cholesterol, reduce portion size, continue improving regular exercise

## 2023-03-17 NOTE — Patient Instructions (Addendum)
Thank you for coming to the office today.  Mild elevated A1c still at 6.6 Technically above the range of Diabetes We will try to improve diet lifestyle for one more round before labeling as diabetic.  Limit sweet tea and ice cream  PSA gradual increase over past 1+ years, still very mild and normal. This is quite expected for inc prostate size over the years. Not a concern but we can still watch it closely.  Repeat A1c and PSA in 6 months.  Keep close watch on the BP  Goal < 140 /90  DUE for NON FASTING BLOOD WORK   SCHEDULE "Lab Only" visit in the morning at the clinic for lab draw in 6 MONTHS   - Make sure Lab Only appointment is at about 1 week before your next appointment, so that results will be available  For Lab Results, once available within 2-3 days of blood draw, you can can log in to MyChart online to view your results and a brief explanation. Also, we can discuss results at next follow-up visit.   Please schedule a Follow-up Appointment to: Return in about 6 months (around 09/15/2023) for 6 month lab only then 1 week later Follow-up BP, Pre-Diabetes, PSA lab result..  If you have any other questions or concerns, please feel free to call the office or send a message through MyChart. You may also schedule an earlier appointment if necessary.  Additionally, you may be receiving a survey about your experience at our office within a few days to 1 week by e-mail or mail. We value your feedback.  Saralyn Pilar, DO [facility] [place]

## 2023-03-17 NOTE — Progress Notes (Signed)
Subjective:    [name], male    DOB: 1950/10/30, 72 y.o.   MRN: [id number]  Jesse Figueroa is a 72 y.o. male presenting on 03/17/2023 for Annual Exam   HPI  Discussed the use of AI scribe software for clinical note transcription with the patient, who gave verbal consent to proceed.  Here for Annual Physical and Lab Review   Elevated A1c / Pre-Diabetes A1c elevated up to 6.6, prior 6.3 to 6.7 range - he is at risk of type 2 diabetes CBGs: Not check CBG at home Meds: None (never on) Currently on [employer] sweet tea and ice cream attributed - Family history of DM, Mother Denies hypoglycemia, polyuria, polydipsia   CHRONIC HTN CKD-III Mild elevated Creatinine 1.31 Reports doing well. He does report some elevated BP at times, usually in afternoon, ranging 130-140. Checks BP on wrist regularly. Current Meds - Losartan 100mg  daily, Propranolol 20mg  BID for essential tremor and HTN   Reports good compliance, took meds today. Tolerating well, w/o complaints. Lifestyle: - Diet: improving - Exercise: regular Admits rare headache Denies CP, dyspnea, edema, dizziness / lightheadedness   Kidney Cyst, non specific lesion - PMH previously had surveillance on MR 2 years in a row.    HYPERLIPIDEMIA: Lab results overall look good, controlled LDL 70s - Currently taking Atorvastatin 10mg , tolerating well without side effects or myalgias Lifestyle - Diet: limited - Exercise: improved activity exercise now   Left Plantar Fasciitis Followed by Fredda Hammed - Dr Excell Seltzer He has done evaluation with X-ray and Vascular ABI study, negative imaging Improved overall, has episodic flares Wears supports / inserts S/p injection by Podiatry - dramatic improvement   Insomnia Failed Melatonin, Trazodone He has difficulty sleep maintenance, wakes up 4-5am often, he prefers to avoid medication   Health Maintenance: Future consider Shingrix TDap vaccines at pharmacy  Flu  Shot today  PSA 0.91 (2022) > 1.24 (2023) > 1.40 (2024) The patient's prostate-specific antigen (PSA) has been gradually increasing over the past few years, but the increase is very slow and still within the normal range. The patient reported a family history of colon cancer in their son.      03/17/2023    8:05 AM 12/23/2022    1:20 PM 10/10/2022    8:09 AM  Depression screen PHQ 2/9  Decreased Interest 0 0 0  Down, Depressed, Hopeless 0 0 0  PHQ - 2 Score 0 0 0  Altered sleeping 2 1 0  Tired, decreased energy 0 2 0  Change in appetite 0 0 0  Feeling bad or failure about yourself  0 0 0  Trouble concentrating 0 0 0  Moving slowly or fidgety/restless 0 0 0  Suicidal thoughts 0 0 0  PHQ-9 Score 2 3 0  Difficult doing work/chores Not difficult at all Not difficult at all Not difficult at all    Past Medical History:  Diagnosis Date   Age-related nuclear cataract of both eyes 05/11/2018   Arthritis of hand    Benign hypertension with CKD (chronic kidney disease), stage II    CKD (chronic kidney disease), stage II    Hyperlipidemia    Hypertension    Tremor    Past Surgical History:  Procedure Laterality Date   COLONOSCOPY N/A 05/13/2021   Procedure: COLONOSCOPY;  Surgeon: Regis Bill, MD;  Location: [facility] ENDOSCOPY;  Service: Endoscopy;  Laterality: N/A;   COLONOSCOPY WITH PROPOFOL N/A 02/05/2016   Procedure: COLONOSCOPY WITH  PROPOFOL;  Surgeon: Christena Deem, MD;  Location: [facility] ENDOSCOPY;  Service: Endoscopy;  Laterality: N/A;   EYE SURGERY  Lasic   Social History   Socioeconomic History   Marital status: Married    Spouse name: Not on file   Number of children: Not on file   Years of education: Not on file   Highest education level: Not on file  Occupational History   Occupation: Electrical engineer School Track    Comment: Teacher, 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  Tobacco Use   Smoking status: Never   Smokeless tobacco: Never  Vaping Use   Vaping status: Never Used  Substance  and Sexual Activity   Alcohol use: No   Drug use: No   Sexual activity: Yes  Other Topics Concern   Not on file  Social History Narrative   Track coach 5 days a week    Social Determinants of Health   Financial Resource Strain: Low Risk  (10/10/2022)   Overall Financial Resource Strain (CARDIA)    Difficulty of Paying Living Expenses: Not hard at all  Food Insecurity: No Food Insecurity (10/10/2022)   Hunger Vital Sign    Worried About Running Out of Food in the Last Year: Never true    Ran Out of Food in the Last Year: Never true  Transportation Needs: No Transportation Needs (10/10/2022)   PRAPARE - Administrator, Civil Service (Medical): No    Lack of Transportation (Non-Medical): No  Physical Activity: Sufficiently Active (10/10/2022)   Exercise Vital Sign    Days of Exercise per Week: 5 days    Minutes of Exercise per Session: 60 min  Stress: No Stress Concern Present (10/10/2022)   [employer] of Occupational Health - Occupational Stress Questionnaire    Feeling of Stress : Not at all  Social Connections: Unknown (10/10/2022)   Social Connection and Isolation Panel [NHANES]    Frequency of Communication with Friends and Family: More than three times a week    Frequency of Social Gatherings with Friends and Family: Once a week    Attends Religious Services: Patient declined    Database administrator or Organizations: Patient declined    Attends Banker Meetings: Never    Marital Status: Married  Catering manager Violence: Not At Risk (10/10/2022)   Humiliation, Afraid, Rape, and Kick questionnaire    Fear of Current or Ex-Partner: No    Emotionally Abused: No    Physically Abused: No    Sexually Abused: No   Family History  Problem Relation Age of Onset   Heart disease Mother    Hypertension Mother    Diabetes Mother    Cancer Father    Colon cancer Son    Current Outpatient Medications on File Prior to Visit  Medication Sig   aspirin  81 MG chewable tablet Chew by mouth.   calcium carbonate (TUMS - DOSED IN MG ELEMENTAL CALCIUM) 500 MG chewable tablet Chew 1 tablet by mouth daily as needed for indigestion or heartburn.   diclofenac Sodium (VOLTAREN) 1 % GEL Apply 2 g topically 4 (four) times daily as needed (joint pain arthritis shoulder).   fluticasone (FLONASE) 50 MCG/ACT nasal spray USE 2 SPRAYS IN BOTH NOSTRILS DAILY. USE FOR 4 TO 6 WEEKS THEN STOP AND USE SEASONALLY OR AS NEEDED.   No current facility-administered medications on file prior to visit.    Review of Systems  Constitutional:  Negative for activity change, appetite change, chills, diaphoresis, fatigue and  fever.  HENT:  Negative for congestion and hearing loss.   Eyes:  Negative for visual disturbance.  Respiratory:  Negative for cough, chest tightness, shortness of breath and wheezing.   Cardiovascular:  Negative for chest pain, palpitations and leg swelling.  Gastrointestinal:  Negative for abdominal pain, constipation, diarrhea, nausea and vomiting.  Genitourinary:  Negative for dysuria, frequency and hematuria.  Musculoskeletal:  Negative for arthralgias and neck pain.  Skin:  Negative for rash.  Neurological:  Negative for dizziness, weakness, light-headedness, numbness and headaches.  Hematological:  Negative for adenopathy.  Psychiatric/Behavioral:  Negative for behavioral problems, dysphoric mood and sleep disturbance.    Per HPI unless specifically indicated above      Objective:    BP 138/82 (BP Location: Left Arm, Cuff Size: Normal)   Pulse (!) 58   Ht 5\' 11"  (1.803 m)   Wt 172 lb (78 kg)   SpO2 100%   BMI 23.99 kg/m   Wt Readings from Last 3 Encounters:  03/17/23 172 lb (78 kg)  12/23/22 168 lb (76.2 kg)  10/10/22 179 lb 12.8 oz (81.6 kg)    Physical Exam Vitals and nursing note reviewed.  Constitutional:      General: He is not in acute distress.    Appearance: He is well-developed. He is not diaphoretic.     Comments:  Well-appearing, comfortable, cooperative  HENT:     Head: Normocephalic and atraumatic.  Eyes:     General:        Right eye: No discharge.        Left eye: No discharge.     Conjunctiva/sclera: Conjunctivae normal.     Pupils: Pupils are equal, round, and reactive to light.  Neck:     Thyroid: No thyromegaly.  Cardiovascular:     Rate and Rhythm: Normal rate and regular rhythm.     Pulses: Normal pulses.     Heart sounds: Normal heart sounds. No murmur heard. Pulmonary:     Effort: Pulmonary effort is normal. No respiratory distress.     Breath sounds: Normal breath sounds. No wheezing or rales.  Abdominal:     General: Bowel sounds are normal. There is no distension.     Palpations: Abdomen is soft. There is no mass.     Tenderness: There is no abdominal tenderness.  Musculoskeletal:        General: No tenderness. Normal range of motion.     Cervical back: Normal range of motion and neck supple.     Right lower leg: No edema.     Left lower leg: No edema.     Comments: Upper / Lower Extremities: - Normal muscle tone, strength bilateral upper extremities 5/5, lower extremities 5/5  Lymphadenopathy:     Cervical: No cervical adenopathy.  Skin:    General: Skin is warm and dry.     Findings: No erythema or rash.  Neurological:     Mental Status: He is alert and oriented to person, place, and time.     Comments: Distal sensation intact to light touch all extremities  Psychiatric:        Mood and Affect: Mood normal.        Behavior: Behavior normal.        Thought Content: Thought content normal.     Comments: Well groomed, good eye contact, normal speech and thoughts      Results for orders placed or performed in visit on 03/10/23  TSH  Result Value Ref Range  TSH 2.31 0.40 - 4.50 mIU/L  PSA  Result Value Ref Range   PSA 1.40 < OR = 4.00 ng/mL  Hemoglobin A1c  Result Value Ref Range   Hgb A1c MFr Bld 6.6 (H) <5.7 % of total Hgb   Mean Plasma Glucose 143 mg/dL    eAG (mmol/L) 7.9 mmol/L  Lipid panel  Result Value Ref Range   Cholesterol 146 <200 mg/dL   HDL 54 > OR = 40 mg/dL   Triglycerides 76 <956 mg/dL   LDL Cholesterol (Calc) 76 mg/dL (calc)   Total CHOL/HDL Ratio 2.7 <5.0 (calc)   Non-HDL Cholesterol (Calc) 92 <213 mg/dL (calc)  CBC with Differential/Platelet  Result Value Ref Range   WBC 4.1 3.8 - 10.8 Thousand/uL   RBC 5.23 4.20 - 5.80 Million/uL   Hemoglobin 14.3 13.2 - 17.1 g/dL   HCT 08.6 57.8 - 46.9 %   MCV 87.8 80.0 - 100.0 fL   MCH 27.3 27.0 - 33.0 pg   MCHC 31.2 (L) 32.0 - 36.0 g/dL   RDW 62.9 52.8 - 41.3 %   Platelets 314 140 - 400 Thousand/uL   MPV 10.1 7.5 - 12.5 fL   Neutro Abs 1,693 1,500 - 7,800 cells/uL   Lymphs Abs 1,763 850 - 3,900 cells/uL   Absolute Monocytes 435 200 - 950 cells/uL   Eosinophils Absolute 180 15 - 500 cells/uL   Basophils Absolute 29 0 - 200 cells/uL   Neutrophils Relative % 41.3 %   Total Lymphocyte 43.0 %   Monocytes Relative 10.6 %   Eosinophils Relative 4.4 %   Basophils Relative 0.7 %  COMPLETE METABOLIC PANEL WITH GFR  Result Value Ref Range   Glucose, Bld 111 (H) 65 - 99 mg/dL   BUN 16 7 - 25 mg/dL   Creat 2.44 (H) 0.10 - 1.28 mg/dL   eGFR 57 (L) > OR = 60 mL/min/1.56m2   BUN/Creatinine Ratio 12 6 - 22 (calc)   Sodium 140 135 - 146 mmol/L   Potassium 4.3 3.5 - 5.3 mmol/L   Chloride 105 98 - 110 mmol/L   CO2 29 20 - 32 mmol/L   Calcium 9.7 8.6 - 10.3 mg/dL   Total Protein 7.0 6.1 - 8.1 g/dL   Albumin 4.4 3.6 - 5.1 g/dL   Globulin 2.6 1.9 - 3.7 g/dL (calc)   AG Ratio 1.7 1.0 - 2.5 (calc)   Total Bilirubin 0.5 0.2 - 1.2 mg/dL   Alkaline phosphatase (APISO) 113 35 - 144 U/L   AST 29 10 - 35 U/L   ALT 36 9 - 46 U/L      Assessment & Plan:   Problem List Items Addressed This Visit     Benign hypertension with CKD (chronic kidney disease) stage III (HCC)    Improved control on repeat reading Complication with CKD-III  Plan:  1. Continue current BP regimen Losartan 100mg   daily, Propranolol 20mg  BID 2. Encourage improved lifestyle - low sodium diet, regular exercise 3. Continue monitor BP outside office, bring readings to next visit, if persistently >140/90 or new symptoms notify office sooner      Relevant Medications   atorvastatin (LIPITOR) 10 MG tablet   losartan (COZAAR) 100 MG tablet   propranolol (INDERAL) 20 MG tablet   Essential tremor    Controlled on Propranolol      Relevant Medications   propranolol (INDERAL) 20 MG tablet   Hyperlipidemia    Controlled cholesterol on statin and lifestyle Check fasting lipid  Plan: 1.  Continue current meds - Atorvastatin 10mg  daily 2. Encourage improved lifestyle - low carb/cholesterol, reduce portion size, continue improving regular exercise      Relevant Medications   atorvastatin (LIPITOR) 10 MG tablet   losartan (COZAAR) 100 MG tablet   propranolol (INDERAL) 20 MG tablet   Other Visit Diagnoses     Annual physical exam    -  Primary   Need for influenza vaccination       Relevant Orders   Flu Vaccine Trivalent High Dose (Fluad) (Completed)   Essential hypertension       Relevant Medications   atorvastatin (LIPITOR) 10 MG tablet   losartan (COZAAR) 100 MG tablet   propranolol (INDERAL) 20 MG tablet   Has a tremor       Relevant Medications   propranolol (INDERAL) 20 MG tablet       Updated Health Maintenance information Reviewed recent lab results with patient Encouraged improvement to lifestyle with diet and exercise Goal of weight loss   Prediabetes A1C slightly elevated at 6.6 had improved from 6.7, indicating borderline diabetes. Patient reports dietary indiscretions, including increased iced tea and ice cream consumption. -Encourage dietary modifications to reduce sugar intake. -Recheck A1C in 6 months. Advised new diagnosis T2DM if still >6.5 next time  Prostate Health Gradual increase in PSA over the past year, but still within normal limits. No family history of  prostate cancer. -Continue to monitor PSA levels. -Recheck PSA in 6 months.  General Health Maintenance -Administer influenza vaccine today. -Encourage consideration of shingles and tetanus vaccines, available at the pharmacy. -Schedule follow-up appointment in 6 months.       Meds ordered this encounter  Medications   atorvastatin (LIPITOR) 10 MG tablet    Sig: Take 1 tablet (10 mg total) by mouth daily.    Dispense:  90 tablet    Refill:  3    Add refills for future   losartan (COZAAR) 100 MG tablet    Sig: Take 1 tablet (100 mg total) by mouth daily.    Dispense:  90 tablet    Refill:  3   propranolol (INDERAL) 20 MG tablet    Sig: Take 1 tablet (20 mg total) by mouth 2 (two) times daily.    Dispense:  180 tablet    Refill:  3    Add extra refills for future      Follow up plan: Return in about 6 months (around 09/15/2023) for 6 month lab only then 1 week later Follow-up BP, Pre-Diabetes, PSA lab result..  6 month PSA + A1c, BP follow-up  Saralyn Pilar, DO [facility] 03/17/2023, 8:13 AM

## 2023-03-17 NOTE — Assessment & Plan Note (Signed)
Controlled on Propranolol 

## 2023-03-17 NOTE — Assessment & Plan Note (Signed)
Improved control on repeat reading Complication with CKD-III  Plan:  1. Continue current BP regimen Losartan 100mg  daily, Propranolol 20mg  BID 2. Encourage improved lifestyle - low sodium diet, regular exercise 3. Continue monitor BP outside office, bring readings to next visit, if persistently >140/90 or new symptoms notify office sooner

## 2023-03-17 NOTE — Addendum Note (Signed)
Addended by: Smitty Cords on: 03/17/2023 06:21 PM   Modules accepted: Level of Service

## 2023-03-23 DIAGNOSIS — I129 Hypertensive chronic kidney disease with stage 1 through stage 4 chronic kidney disease, or unspecified chronic kidney disease: Secondary | ICD-10-CM | POA: Diagnosis not present

## 2023-03-23 DIAGNOSIS — Z008 Encounter for other general examination: Secondary | ICD-10-CM | POA: Diagnosis not present

## 2023-03-23 DIAGNOSIS — Z6823 Body mass index (BMI) 23.0-23.9, adult: Secondary | ICD-10-CM | POA: Diagnosis not present

## 2023-03-23 DIAGNOSIS — N1831 Chronic kidney disease, stage 3a: Secondary | ICD-10-CM | POA: Diagnosis not present

## 2023-03-23 DIAGNOSIS — E1169 Type 2 diabetes mellitus with other specified complication: Secondary | ICD-10-CM | POA: Diagnosis not present

## 2023-03-23 DIAGNOSIS — E785 Hyperlipidemia, unspecified: Secondary | ICD-10-CM | POA: Diagnosis not present

## 2023-05-18 ENCOUNTER — Telehealth: Payer: Self-pay | Admitting: Family Medicine

## 2023-05-18 NOTE — Telephone Encounter (Signed)
Please call the patient to schedule an appointment   he states that he calls the office and the phone just rings and he can't get through to anyone.   Thank you,  Judeth Cornfield,  AMB Clinical Support [facility] AWV Program Direct Dial ??[phone]

## 2023-05-25 ENCOUNTER — Ambulatory Visit (INDEPENDENT_AMBULATORY_CARE_PROVIDER_SITE_OTHER): Payer: No Typology Code available for payment source | Admitting: Family Medicine

## 2023-05-25 ENCOUNTER — Encounter: Payer: Self-pay | Admitting: Family Medicine

## 2023-05-25 VITALS — BP 130/84 | Ht 71.0 in | Wt 174.0 lb

## 2023-05-25 DIAGNOSIS — G250 Essential tremor: Secondary | ICD-10-CM | POA: Diagnosis not present

## 2023-05-25 MED ORDER — PRIMIDONE 50 MG PO TABS
50.0000 mg | ORAL_TABLET | Freq: Every day | ORAL | 2 refills | Status: DC
Start: 2023-05-25 — End: 2023-08-21

## 2023-05-25 NOTE — Progress Notes (Signed)
Subjective:    [name], male    DOB: 04-Oct-1950, 72 y.o.   MRN: [id number]  Jesse Figueroa is a 72 y.o. male presenting on 05/25/2023 for Medical Management of Chronic Issues (Tremor in right hand)   HPI  Discussed the use of AI scribe software for clinical note transcription with the patient, who gave verbal consent to proceed.  Tremor, Action (Right Handed only) Right Hand dominant  The patient, with a known history of diabetes and tremors, presents with worsening tremors in his dominant right hand. The tremors have become more pronounced, making it difficult for the patient to hold objects such as glasses or plates. The patient reports that the tremors are not affected by the weight of the object but are more noticeable when performing fine motor tasks. The tremors are described as large, covering a couple of inches, rather than a fine shake.  The patient has been on Propranolol for several years, initially prescribed by a neurologist in 2016. Despite this treatment, the tremors have continued to worsen, impacting the patient's quality of life. The patient does not report any significant side effects from the Propranolol, such as fatigue or lowered blood pressure.  In addition to the tremors, the patient has been self-administering apple cider vinegar in an attempt to manage his diabetes. The patient does not report any adverse effects from this supplement.          05/25/2023    2:41 PM 03/17/2023    8:05 AM 12/23/2022    1:20 PM  Depression screen PHQ 2/9  Decreased Interest 0 0 0  Down, Depressed, Hopeless 0 0 0  PHQ - 2 Score 0 0 0  Altered sleeping 2 2 1   Tired, decreased energy 0 0 2  Change in appetite 0 0 0  Feeling bad or failure about yourself  0 0 0  Trouble concentrating 0 0 0  Moving slowly or fidgety/restless 0 0 0  Suicidal thoughts 0 0 0  PHQ-9 Score 2 2 3   Difficult doing work/chores  Not difficult at all Not difficult at all        05/25/2023    2:41 PM 03/17/2023    8:08 AM 12/23/2022    1:20 PM 09/15/2022    9:00 AM  GAD 7 : Generalized Anxiety Score  Nervous, Anxious, on Edge 1 0 0 0  Control/stop worrying 1 0 0 0  Worry too much - different things 0 0 0 0  Trouble relaxing 0 0 0 0  Restless 0 0 0 0  Easily annoyed or irritable 0 0 0 0  Afraid - awful might happen 0 0 0 0  Total GAD 7 Score 2 0 0 0  Anxiety Difficulty  Not difficult at all Not difficult at all Not difficult at all    Social History   Tobacco Use   Smoking status: Never   Smokeless tobacco: Never  Vaping Use   Vaping status: Never Used  Substance Use Topics   Alcohol use: No   Drug use: No    Review of Systems Per HPI unless specifically indicated above     Objective:    BP 130/84   Ht 5\' 11"  (1.803 m)   Wt 174 lb (78.9 kg)   BMI 24.27 kg/m   Wt Readings from Last 3 Encounters:  05/25/23 174 lb (78.9 kg)  03/17/23 172 lb (78 kg)  12/23/22 168 lb (76.2 kg)    Physical Exam Vitals  and nursing note reviewed.  Constitutional:      General: He is not in acute distress.    Appearance: Normal appearance. He is well-developed. He is not diaphoretic.     Comments: Well-appearing, comfortable, cooperative  HENT:     Head: Normocephalic and atraumatic.  Eyes:     General:        Right eye: No discharge.        Left eye: No discharge.     Conjunctiva/sclera: Conjunctivae normal.  Cardiovascular:     Rate and Rhythm: Normal rate.  Pulmonary:     Effort: Pulmonary effort is normal.  Skin:    General: Skin is warm and dry.     Findings: No erythema or rash.  Neurological:     General: No focal deficit present.     Mental Status: He is alert and oriented to person, place, and time.     Sensory: No sensory deficit.     Comments: Tremors with activity not at rest  Psychiatric:        Mood and Affect: Mood normal.        Behavior: Behavior normal.        Thought Content: Thought content normal.     Comments: Well groomed, good  eye contact, normal speech and thoughts     Results for orders placed or performed in visit on 03/10/23  TSH  Result Value Ref Range   TSH 2.31 0.40 - 4.50 mIU/L  PSA  Result Value Ref Range   PSA 1.40 < OR = 4.00 ng/mL  Hemoglobin A1c  Result Value Ref Range   Hgb A1c MFr Bld 6.6 (H) <5.7 % of total Hgb   Mean Plasma Glucose 143 mg/dL   eAG (mmol/L) 7.9 mmol/L  Lipid panel  Result Value Ref Range   Cholesterol 146 <200 mg/dL   HDL 54 > OR = 40 mg/dL   Triglycerides 76 <161 mg/dL   LDL Cholesterol (Calc) 76 mg/dL (calc)   Total CHOL/HDL Ratio 2.7 <5.0 (calc)   Non-HDL Cholesterol (Calc) 92 <096 mg/dL (calc)  CBC with Differential/Platelet  Result Value Ref Range   WBC 4.1 3.8 - 10.8 Thousand/uL   RBC 5.23 4.20 - 5.80 Million/uL   Hemoglobin 14.3 13.2 - 17.1 g/dL   HCT 04.5 40.9 - 81.1 %   MCV 87.8 80.0 - 100.0 fL   MCH 27.3 27.0 - 33.0 pg   MCHC 31.2 (L) 32.0 - 36.0 g/dL   RDW 91.4 78.2 - 95.6 %   Platelets 314 140 - 400 Thousand/uL   MPV 10.1 7.5 - 12.5 fL   Neutro Abs 1,693 1,500 - 7,800 cells/uL   Lymphs Abs 1,763 850 - 3,900 cells/uL   Absolute Monocytes 435 200 - 950 cells/uL   Eosinophils Absolute 180 15 - 500 cells/uL   Basophils Absolute 29 0 - 200 cells/uL   Neutrophils Relative % 41.3 %   Total Lymphocyte 43.0 %   Monocytes Relative 10.6 %   Eosinophils Relative 4.4 %   Basophils Relative 0.7 %  COMPLETE METABOLIC PANEL WITH GFR  Result Value Ref Range   Glucose, Bld 111 (H) 65 - 99 mg/dL   BUN 16 7 - 25 mg/dL   Creat 2.13 (H) 0.86 - 1.28 mg/dL   eGFR 57 (L) > OR = 60 mL/min/1.10m2   BUN/Creatinine Ratio 12 6 - 22 (calc)   Sodium 140 135 - 146 mmol/L   Potassium 4.3 3.5 - 5.3 mmol/L   Chloride 105  98 - 110 mmol/L   CO2 29 20 - 32 mmol/L   Calcium 9.7 8.6 - 10.3 mg/dL   Total Protein 7.0 6.1 - 8.1 g/dL   Albumin 4.4 3.6 - 5.1 g/dL   Globulin 2.6 1.9 - 3.7 g/dL (calc)   AG Ratio 1.7 1.0 - 2.5 (calc)   Total Bilirubin 0.5 0.2 - 1.2 mg/dL    Alkaline phosphatase (APISO) 113 35 - 144 U/L   AST 29 10 - 35 U/L   ALT 36 9 - 46 U/L      Assessment & Plan:   Problem List Items Addressed This Visit     Essential tremor - Primary   Relevant Medications   primidone (MYSOLINE) 50 MG tablet     Action Tremor, benign Increasing difficulty with fine motor tasks due to right hand tremor. No associated harm or danger, but impacting quality of life. Previously tried Propranolol with limited success. -Discontinue Propranolol. -Start Primidone 50mg  at bedtime, with option to start with half a pill for the first week. -Check in 2 weeks via phone or message to assess response and side effects.  If Primidone is unsuccessful or not tolerated, consider referral back to Neurology for further management options.         No orders of the defined types were placed in this encounter.   Meds ordered this encounter  Medications   primidone (MYSOLINE) 50 MG tablet    Sig: Take 1 tablet (50 mg total) by mouth at bedtime. For tremors    Dispense:  30 tablet    Refill:  2    Follow up plan: Return if symptoms worsen or fail to improve.   Saralyn Pilar, DO [facility] 05/25/2023, 2:43 PM

## 2023-05-25 NOTE — Patient Instructions (Addendum)
Thank you for coming to the office today.  You have an ACTION Tremor. This is not harmful, but it is frustrating and can interfere with function.  Stop Propranolol  Start Primidone 50mg  nightly for tremors at bedtime, okay to take HALF tab for first 1 week and then dose increase to WHOLE tab 50mg  daily.  Try this for now and we can adjust or switch back to original medicine if need at a different dose.  If still unsuccessful, we could return to Neurology, you saw them in 2016.   Please schedule a Follow-up Appointment to: Return if symptoms worsen or fail to improve.  If you have any other questions or concerns, please feel free to call the office or send a message through MyChart. You may also schedule an earlier appointment if necessary.  Additionally, you may be receiving a survey about your experience at our office within a few days to 1 week by e-mail or mail. We value your feedback.  Saralyn Pilar, DO [facility], [place]

## 2023-08-20 ENCOUNTER — Ambulatory Visit: Payer: Self-pay | Admitting: Family Medicine

## 2023-08-20 NOTE — Telephone Encounter (Signed)
 Chief Complaint: Abdominal pain Symptoms: abdominal pain Frequency: off and on Pertinent Negatives: Patient denies fever, swelling, firmness, constipation, injury  Disposition: [] ED /[] Urgent Care (no appt availability in office) / [x] Appointment(In office/virtual)/ []  [facility]/ [] Home Care/ [] Refused Recommended Disposition /[] [facility]/ []  Follow-up with PCP Additional Notes: Patient called with complaints of abdominal pain that started a week and half ago. Patient states onset was gradual, comes and goes, and is in general mild with achy sensation. Patient states pain moves all over abdomen and sometimes radiates to hips, but states that pain does not have a usual pattern of time length. Patient states he is having regular soft and formed bowel movements, eating and drinking normally, no new medications, and states no known relieving or aggravating factors. Patient denies firmness/harness to abdomen, swelling, back pain, n/v, constipation, injury. Patient has not used medication for treatment of symptoms. Patient advised by this RN to be seen within the next 24 hours to which patient was agreeable. Appt scheduled. Patient advised by this RN to call back with worsening symptoms. Patient verbalized understanding.   Copied from CRM [id number]. Topic: Clinical - Red Word Triage >> Aug 20, 2023  9:31 AM Marlow Baars wrote: Red Word that prompted transfer to Nurse Triage: The patient called in to schedule an appt with his provider but after speaking with him he mentioned a red word symptom of Abdominal pain and constipation. With that I will transfer him to [facility]. Reason for Disposition  [1] MILD pain (e.g., does not interfere with normal activities) AND [2] pain comes and goes (cramps) [3] present > 48 hours  (Exception: This same abdominal pain is a chronic symptom recurrent or ongoing AND present > 4 weeks.)  Answer Assessment - Initial Assessment Questions 1. LOCATION: "Where  does it hurt?"      All over abdominal 2. RADIATION: "Does the pain shoot anywhere else?" (e.g., chest, back)     Hips 3. ONSET: "When did the pain begin?" (Minutes, hours or days ago)      Week and half ago 4. SUDDEN: "Gradual or sudden onset?"     Gradual 5. PATTERN "Does the pain come and go, or is it constant?"    - If it comes and goes: "How long does it last?" "Do you have pain now?"     (Note: Comes and goes means the pain is intermittent. It goes away completely between bouts.)    - If constant: "Is it getting better, staying the same, or getting worse?"      (Note: Constant means the pain never goes away completely; most serious pain is constant and gets worse.)      Off and on 6. SEVERITY: "How bad is the pain?"  (e.g., Scale 1-10; mild, moderate, or severe)    - MILD (1-3): Doesn't interfere with normal activities, abdomen soft and not tender to touch.     - MODERATE (4-7): Interferes with normal activities or awakens from sleep, abdomen tender to touch.     - SEVERE (8-10): Excruciating pain, doubled over, unable to do any normal activities.       Achy, 0 right now, 30 mi 7. RECURRENT SYMPTOM: "Have you ever had this type of stomach pain before?" If Yes, ask: "When was the last time?" and "What happened that time?"      "Little while back, 8. CAUSE: "What do you think is causing the stomach pain?"     "I'm not sure  9. RELIEVING/AGGRAVATING  FACTORS: "What makes it better or worse?" (e.g., antacids, bending or twisting motion, bowel movement)     Denies 10. OTHER SYMPTOMS: "Do you have any other symptoms?" (e.g., back pain, diarrhea, fever, urination pain, vomiting)     Denies  Cold medication  Protocols used: Abdominal Pain - Male-A-AH

## 2023-08-21 ENCOUNTER — Other Ambulatory Visit: Payer: Self-pay | Admitting: Family Medicine

## 2023-08-21 ENCOUNTER — Encounter: Payer: Self-pay | Admitting: Family Medicine

## 2023-08-21 ENCOUNTER — Ambulatory Visit: Admitting: Family Medicine

## 2023-08-21 VITALS — BP 128/72 | HR 53 | Resp 16 | Ht 71.0 in | Wt 174.6 lb

## 2023-08-21 DIAGNOSIS — R109 Unspecified abdominal pain: Secondary | ICD-10-CM

## 2023-08-21 DIAGNOSIS — G250 Essential tremor: Secondary | ICD-10-CM

## 2023-08-21 MED ORDER — PROPRANOLOL HCL 20 MG PO TABS
20.0000 mg | ORAL_TABLET | Freq: Two times a day (BID) | ORAL | 2 refills | Status: DC
Start: 2023-08-21 — End: 2024-02-05

## 2023-08-21 NOTE — Patient Instructions (Addendum)
 Thank you for coming to the office today.  For Dysfunctional Bowel or IBS  - IBGard OTC Peppermint Oil (Triple Coated Capsule) 180mg  take one 3 times daily to reduce diarrhea - May take OTC Fiber supplement (metamucil powder or pill/gummy) - May try OTC Probiotic   For Constipation (less frequent bowel movement that can be hard dry or involve straining).  Recommend trying OTC Miralax powder 17g = 1 capful in large glass water once daily for now, try several days to see if working, goal is soft stool or BM 1-2 times daily, if too loose then reduce dose or try every other day. If not effective may need to increase it to 2 doses at once in AM or may do 1 in morning and 1 in afternoon/evening  - This medicine is very safe and can be used often without any problem and will not make you dehydrated. It is good for use on AS NEEDED BASIS or even MAINTENANCE therapy for longer term for several days to weeks at a time to help regulate bowel movements  -----------------  Beyond this  One other rx abdominal pain cramping spasming medicine  Dicyclomine capsules can be taken as needed  Consider 2nd opinion from GI specialist in future.   Please schedule a Follow-up Appointment to: Return if symptoms worsen or fail to improve.  If you have any other questions or concerns, please feel free to call the office or send a message through MyChart. You may also schedule an earlier appointment if necessary.  Additionally, you may be receiving a survey about your experience at our office within a few days to 1 week by e-mail or mail. We value your feedback.  Saralyn Pilar, DO [place], [place]

## 2023-08-21 NOTE — Progress Notes (Signed)
 Subjective:    [name], male    DOB: 01-Oct-1950, 73 y.o.   MRN: [id number]  Jesse Figueroa is a 73 y.o. male presenting on 08/21/2023 for Follow-up (Left Lower Quad pain in abdomen x 2 weeks. Denies constipation or diarrhea. Pain is mild and comes and goes. )   HPI  Discussed the use of AI scribe software for clinical note transcription with the patient, who gave verbal consent to proceed.  History of Present Illness   He presents with left lower abdominal pain.  He has been experiencing left lower abdominal pain for the past two weeks. The pain is described as annoying and uncomfortable, occurring sporadically multiple times a day. It is not constant and migrates, not localized to one spot. Currently, the pain is not present but was felt a few minutes ago.  He recalls similar symptoms in the past, with discussions about this issue dating back to 2019, 2020, and 2021. Previous evaluations include colonoscopies in 2017 and 2022, and an MRI which revealed a kidney cyst. These tests did not reveal any major colon or digestive issues.  No associated symptoms such as nausea, vomiting, diarrhea, dark stool, or blood in the stool. He also denies upper gastrointestinal symptoms like gas, bloating, belching, or heartburn. The pain does not interfere with digestion, and he has regular bowel movements without constipation or diarrhea.  He notes that eating seems to alleviate the pain, although he is uncertain about this observation. The pain is described as a sharp, gnawing sensation, likened to a 'hunger pain' or 'pulsing wave'.  He has not consulted a gastroenterologist specifically for this issue but has had a colonoscopy. He has not been taking any daily medications for digestion but recently started using a fiber supplement like Metamucil.         08/21/2023    9:47 AM 05/25/2023    2:41 PM 03/17/2023    8:05 AM  Depression screen PHQ 2/9  Decreased Interest 0 0 0  Down,  Depressed, Hopeless 0 0 0  PHQ - 2 Score 0 0 0  Altered sleeping  2 2  Tired, decreased energy  0 0  Change in appetite  0 0  Feeling bad or failure about yourself   0 0  Trouble concentrating  0 0  Moving slowly or fidgety/restless  0 0  Suicidal thoughts  0 0  PHQ-9 Score  2 2  Difficult doing work/chores   Not difficult at all       08/21/2023    9:47 AM 05/25/2023    2:41 PM 03/17/2023    8:08 AM 12/23/2022    1:20 PM  GAD 7 : Generalized Anxiety Score  Nervous, Anxious, on Edge 0 1 0 0  Control/stop worrying 0 1 0 0  Worry too much - different things 0 0 0 0  Trouble relaxing 0 0 0 0  Restless 0 0 0 0  Easily annoyed or irritable 0 0 0 0  Afraid - awful might happen 0 0 0 0  Total GAD 7 Score 0 2 0 0  Anxiety Difficulty   Not difficult at all Not difficult at all    Social History   Tobacco Use   Smoking status: Never   Smokeless tobacco: Never  Vaping Use   Vaping status: Never Used  Substance Use Topics   Alcohol use: No   Drug use: No    Review of Systems Per HPI unless specifically indicated above  Objective:    BP 128/72   Pulse (!) 53   Resp 16   Ht 5\' 11"  (1.803 m)   Wt 174 lb 9.6 oz (79.2 kg)   SpO2 100%   BMI 24.35 kg/m   Wt Readings from Last 3 Encounters:  08/21/23 174 lb 9.6 oz (79.2 kg)  05/25/23 174 lb (78.9 kg)  03/17/23 172 lb (78 kg)    Physical Exam Vitals and nursing note reviewed.  Constitutional:      General: He is not in acute distress.    Appearance: He is well-developed. He is not diaphoretic.     Comments: Well-appearing, comfortable, cooperative  HENT:     Head: Normocephalic and atraumatic.  Eyes:     General:        Right eye: No discharge.        Left eye: No discharge.     Conjunctiva/sclera: Conjunctivae normal.  Neck:     Thyroid: No thyromegaly.  Cardiovascular:     Rate and Rhythm: Normal rate and regular rhythm.     Pulses: Normal pulses.     Heart sounds: Normal heart sounds. No murmur  heard. Pulmonary:     Effort: Pulmonary effort is normal. No respiratory distress.     Breath sounds: Normal breath sounds. No wheezing or rales.  Abdominal:     General: Bowel sounds are normal. There is no distension.     Palpations: Abdomen is soft. There is no mass.     Tenderness: There is no abdominal tenderness. There is no guarding.     Hernia: No hernia is present.  Musculoskeletal:        General: Normal range of motion.     Cervical back: Normal range of motion and neck supple.  Lymphadenopathy:     Cervical: No cervical adenopathy.  Skin:    General: Skin is warm and dry.     Findings: No erythema or rash.  Neurological:     Mental Status: He is alert and oriented to person, place, and time. Mental status is at baseline.  Psychiatric:        Behavior: Behavior normal.     Comments: Well groomed, good eye contact, normal speech and thoughts     Results for orders placed or performed in visit on 03/10/23  TSH   Collection Time: 03/10/23  9:17 AM  Result Value Ref Range   TSH 2.31 0.40 - 4.50 mIU/L  PSA   Collection Time: 03/10/23  9:17 AM  Result Value Ref Range   PSA 1.40 < OR = 4.00 ng/mL  Hemoglobin A1c   Collection Time: 03/10/23  9:17 AM  Result Value Ref Range   Hgb A1c MFr Bld 6.6 (H) <5.7 % of total Hgb   Mean Plasma Glucose 143 mg/dL   eAG (mmol/L) 7.9 mmol/L  Lipid panel   Collection Time: 03/10/23  9:17 AM  Result Value Ref Range   Cholesterol 146 <200 mg/dL   HDL 54 > OR = 40 mg/dL   Triglycerides 76 <161 mg/dL   LDL Cholesterol (Calc) 76 mg/dL (calc)   Total CHOL/HDL Ratio 2.7 <5.0 (calc)   Non-HDL Cholesterol (Calc) 92 <096 mg/dL (calc)  CBC with Differential/Platelet   Collection Time: 03/10/23  9:17 AM  Result Value Ref Range   WBC 4.1 3.8 - 10.8 Thousand/uL   RBC 5.23 4.20 - 5.80 Million/uL   Hemoglobin 14.3 13.2 - 17.1 g/dL   HCT 04.5 40.9 - 81.1 %   MCV 87.8  80.0 - 100.0 fL   MCH 27.3 27.0 - 33.0 pg   MCHC 31.2 (L) 32.0 - 36.0  g/dL   RDW 40.9 81.1 - 91.4 %   Platelets 314 140 - 400 Thousand/uL   MPV 10.1 7.5 - 12.5 fL   Neutro Abs 1,693 1,500 - 7,800 cells/uL   Lymphs Abs 1,763 850 - 3,900 cells/uL   Absolute Monocytes 435 200 - 950 cells/uL   Eosinophils Absolute 180 15 - 500 cells/uL   Basophils Absolute 29 0 - 200 cells/uL   Neutrophils Relative % 41.3 %   Total Lymphocyte 43.0 %   Monocytes Relative 10.6 %   Eosinophils Relative 4.4 %   Basophils Relative 0.7 %  COMPLETE METABOLIC PANEL WITH GFR   Collection Time: 03/10/23  9:17 AM  Result Value Ref Range   Glucose, Bld 111 (H) 65 - 99 mg/dL   BUN 16 7 - 25 mg/dL   Creat 7.82 (H) 9.56 - 1.28 mg/dL   eGFR 57 (L) > OR = 60 mL/min/1.73m2   BUN/Creatinine Ratio 12 6 - 22 (calc)   Sodium 140 135 - 146 mmol/L   Potassium 4.3 3.5 - 5.3 mmol/L   Chloride 105 98 - 110 mmol/L   CO2 29 20 - 32 mmol/L   Calcium 9.7 8.6 - 10.3 mg/dL   Total Protein 7.0 6.1 - 8.1 g/dL   Albumin 4.4 3.6 - 5.1 g/dL   Globulin 2.6 1.9 - 3.7 g/dL (calc)   AG Ratio 1.7 1.0 - 2.5 (calc)   Total Bilirubin 0.5 0.2 - 1.2 mg/dL   Alkaline phosphatase (APISO) 113 35 - 144 U/L   AST 29 10 - 35 U/L   ALT 36 9 - 46 U/L      Assessment & Plan:   Problem List Items Addressed This Visit   None Visit Diagnoses       Functional abdominal pain syndrome    -  Primary        Abdominal Pain Recurrent, intermittent left lower quadrant pain for several years, chart review from my discussion with patient goes back to 2019-2020. Pain is not severe, but annoying and uncomfortable. No associated nausea, vomiting, diarrhea, dark stool, blood in the stool, gas, bloating, belching, or heartburn. No changes in bowel movements. Pain seems to improve with eating. Previous imaging CT and MRI and colonoscopies have not revealed any significant findings. Possible functional digestive problem or Irritable Bowel Syndrome (IBS).  -Trial of over-the-counter supplements including fiber (Metamucil),  probiotics, and peppermint oil (IB Guard). -Consider Miralax powder if constipation is suspected. -Consider second opinion from GI specialist if symptoms persist despite interventions.  Essential Tremor Worsening of symptoms with Primidone. Improvement with return to Propranolol. -Continue Propranolol for management of essential tremor.  Follow-up Scheduled for physical on April 1st, 2025.         No orders of the defined types were placed in this encounter.   No orders of the defined types were placed in this encounter.   Follow up plan: Return if symptoms worsen or fail to improve.    Saralyn Pilar, DO [facility] 08/21/2023, 9:59 AM

## 2023-08-21 NOTE — Telephone Encounter (Signed)
 D/C 08/21/23 Requested Prescriptions  Refused Prescriptions Disp Refills   primidone (MYSOLINE) 50 MG tablet [Pharmacy Med Name: PRIMIDONE 50 MG TABLET] 90 tablet     Sig: TAKE 1 TABLET (50 MG TOTAL) BY MOUTH AT BEDTIME. FOR TREMORS     Neurology:  Anticonvulsants - primidone Failed - 08/21/2023 12:27 PM      Failed - Cr in normal range and within 360 days    Creat  Date Value Ref Range Status  03/10/2023 1.31 (H) 0.70 - 1.28 mg/dL Final         Failed - Primidone (Serum) in normal range and within 360 days    No results found for: "PRIMIDONE"       Passed - HCT in normal range and within 360 days    HCT  Date Value Ref Range Status  03/10/2023 45.9 38.5 - 50.0 % Final   Hematocrit  Date Value Ref Range Status  04/20/2015 43.3 37.5 - 51.0 % Final         Passed - HGB in normal range and within 360 days    Hemoglobin  Date Value Ref Range Status  03/10/2023 14.3 13.2 - 17.1 g/dL Final  78/29/5621 30.8 12.6 - 17.7 g/dL Final         Passed - PLT in normal range and within 360 days    Platelets  Date Value Ref Range Status  03/10/2023 314 140 - 400 Thousand/uL Final  04/20/2015 327 150 - 379 x10E3/uL Final         Passed - WBC in normal range and within 360 days    WBC  Date Value Ref Range Status  03/10/2023 4.1 3.8 - 10.8 Thousand/uL Final         Passed - AST in normal range and within 360 days    AST  Date Value Ref Range Status  03/10/2023 29 10 - 35 U/L Final         Passed - ALT in normal range and within 360 days    ALT  Date Value Ref Range Status  03/10/2023 36 9 - 46 U/L Final         Passed - Completed PHQ-2 or PHQ-9 in the last 360 days      Passed - Valid encounter within last 12 months    Recent Outpatient Visits           2 months ago Essential tremor   [place] [facility], Netta Neat, DO   5 months ago Annual physical exam   [place] [facility] Smitty Cords, DO   8 months  ago Acute non-recurrent frontal sinusitis   [place] [facility] Smitty Cords, DO   11 months ago Benign hypertension with CKD (chronic kidney disease), stage II   [place] [facility] Smitty Cords, DO   1 year ago Plantar fasciitis, left   [place] [facility] [place], Netta Neat, DO       Future Appointments             In 1 month Althea Charon, Netta Neat, DO [place] [facility]

## 2023-08-27 ENCOUNTER — Other Ambulatory Visit: Payer: Self-pay | Admitting: Family Medicine

## 2023-08-27 DIAGNOSIS — R109 Unspecified abdominal pain: Secondary | ICD-10-CM

## 2023-08-27 NOTE — Telephone Encounter (Unsigned)
 Copied from CRM [id number]. Topic: Clinical - Medical Advice >> Aug 27, 2023  2:40 PM Gildardo Pounds wrote: Reason for CRM: Patient would like to get a second opinion on stomach issues as discussed during appointment with Dr Althea Charon on 07/24/2023. Callback number is  [phone]

## 2023-08-28 ENCOUNTER — Telehealth: Payer: Self-pay

## 2023-08-28 NOTE — Telephone Encounter (Signed)
 Copied from CRM [id number]. Topic: General - Other >> Aug 27, 2023  5:01 PM Shelah Lewandowsky wrote: Reason for CRM: returning call from office, called at closing

## 2023-08-28 NOTE — Telephone Encounter (Signed)
 Searched in patients chart and the only reason for call that I see is Dr. Kirtland Bouchard called to advise referral was placed. Advised wife. Someone should reach out with appointment information soon. No further questions or concerns at this time.

## 2023-08-31 ENCOUNTER — Telehealth: Payer: Self-pay

## 2023-08-31 DIAGNOSIS — H25043 Posterior subcapsular polar age-related cataract, bilateral: Secondary | ICD-10-CM | POA: Diagnosis not present

## 2023-08-31 DIAGNOSIS — H2512 Age-related nuclear cataract, left eye: Secondary | ICD-10-CM | POA: Diagnosis not present

## 2023-08-31 DIAGNOSIS — I10 Essential (primary) hypertension: Secondary | ICD-10-CM | POA: Diagnosis not present

## 2023-08-31 DIAGNOSIS — H2513 Age-related nuclear cataract, bilateral: Secondary | ICD-10-CM | POA: Diagnosis not present

## 2023-08-31 DIAGNOSIS — H25013 Cortical age-related cataract, bilateral: Secondary | ICD-10-CM | POA: Diagnosis not present

## 2023-08-31 NOTE — Telephone Encounter (Signed)
 Left message for patient to return call OK to advise

## 2023-08-31 NOTE — Telephone Encounter (Signed)
 The apt for GI referral was scheduled with [facility] in July 2025. Approximately 4 months away. Right now [facility] is not taking new patients or taking fewer patients, due to needing to higher new providers etc. So we don't have many GI doctors in town right now.  If he is not satisfied with waiting 4 months til July, we can consider other options [address] or [address] / [place] or [place] in [place].  Can you check with him if he prefers to go to one of these locations out of town?  Also, please let him know that there is no guarantee they can see him faster than 3-4 months.  Saralyn Pilar, DO [facility] 08/31/2023, 1:14 PM

## 2023-08-31 NOTE — Telephone Encounter (Signed)
 Copied from CRM [id number]. Topic: General - Other >> Aug 31, 2023 11:29 AM Emylou G wrote: Reason for CRM: Pls contact patient back.. Adv needs to be sooner for his stomach 7/29th - maybe referral for someone else?  Pls call him - [phone]

## 2023-09-01 DIAGNOSIS — E1122 Type 2 diabetes mellitus with diabetic chronic kidney disease: Secondary | ICD-10-CM | POA: Diagnosis not present

## 2023-09-01 DIAGNOSIS — E1169 Type 2 diabetes mellitus with other specified complication: Secondary | ICD-10-CM | POA: Diagnosis not present

## 2023-09-01 DIAGNOSIS — I129 Hypertensive chronic kidney disease with stage 1 through stage 4 chronic kidney disease, or unspecified chronic kidney disease: Secondary | ICD-10-CM | POA: Diagnosis not present

## 2023-09-01 DIAGNOSIS — Z008 Encounter for other general examination: Secondary | ICD-10-CM | POA: Diagnosis not present

## 2023-09-01 DIAGNOSIS — N1831 Chronic kidney disease, stage 3a: Secondary | ICD-10-CM | POA: Diagnosis not present

## 2023-09-01 DIAGNOSIS — E785 Hyperlipidemia, unspecified: Secondary | ICD-10-CM | POA: Diagnosis not present

## 2023-09-02 ENCOUNTER — Telehealth: Payer: Self-pay

## 2023-09-02 NOTE — Telephone Encounter (Signed)
 Nikki, how do you prefer to handle this situation with patient requesting to get in to different location sooner? Would you be able to assist with sending new referral to other location or finding out which location can see him sooner? He currently has a 4 month wait apt in July for current [facility].  He is okay with any other location [place], [place], or [place].  Perhaps try [facility] next?   Is this something you would be able to do, or should our office call and try to find out new patient apt wait times?  Saralyn Pilar, DO [facility] 09/02/2023, 1:12 PM

## 2023-09-02 NOTE — Telephone Encounter (Signed)
 Copied from CRM [id number]. Topic: General - Other >> Sep 02, 2023 10:55 AM Fuller Mandril wrote: Patient called back stated that out of the options listed he would prefer to go to [facility]. If there isn't availability in [place] he will take the next closest with the soonest appt. Thank You

## 2023-09-15 ENCOUNTER — Other Ambulatory Visit: Payer: Self-pay

## 2023-09-15 DIAGNOSIS — R972 Elevated prostate specific antigen [PSA]: Secondary | ICD-10-CM | POA: Diagnosis not present

## 2023-09-15 DIAGNOSIS — I129 Hypertensive chronic kidney disease with stage 1 through stage 4 chronic kidney disease, or unspecified chronic kidney disease: Secondary | ICD-10-CM

## 2023-09-15 DIAGNOSIS — R7309 Other abnormal glucose: Secondary | ICD-10-CM | POA: Diagnosis not present

## 2023-09-15 DIAGNOSIS — N183 Chronic kidney disease, stage 3 unspecified: Secondary | ICD-10-CM | POA: Diagnosis not present

## 2023-09-16 ENCOUNTER — Encounter: Payer: Self-pay | Admitting: Gastroenterology

## 2023-09-16 ENCOUNTER — Encounter: Payer: Self-pay | Admitting: Family Medicine

## 2023-09-16 LAB — BASIC METABOLIC PANEL WITHOUT GFR
BUN: 16 mg/dL (ref 7–25)
CO2: 30 mmol/L (ref 20–32)
Calcium: 9.4 mg/dL (ref 8.6–10.3)
Chloride: 104 mmol/L (ref 98–110)
Creat: 1.16 mg/dL (ref 0.70–1.28)
Glucose, Bld: 105 mg/dL — ABNORMAL HIGH (ref 65–99)
Potassium: 4.5 mmol/L (ref 3.5–5.3)
Sodium: 140 mmol/L (ref 135–146)

## 2023-09-16 LAB — PSA: PSA: 1.5 ng/mL (ref <=4.00)

## 2023-09-16 LAB — HEMOGLOBIN A1C
Hgb A1c MFr Bld: 6.6 %{Hb} — ABNORMAL HIGH (ref <5.7)
Mean Plasma Glucose: 143 mg/dL
eAG (mmol/L): 7.9 mmol/L

## 2023-09-22 ENCOUNTER — Ambulatory Visit: Payer: Self-pay | Admitting: Family Medicine

## 2023-09-23 ENCOUNTER — Ambulatory Visit: Admitting: Family Medicine

## 2023-09-28 ENCOUNTER — Encounter: Payer: Self-pay | Admitting: Family Medicine

## 2023-09-28 ENCOUNTER — Ambulatory Visit (INDEPENDENT_AMBULATORY_CARE_PROVIDER_SITE_OTHER): Admitting: Family Medicine

## 2023-09-28 VITALS — BP 134/78 | HR 50 | Ht 71.0 in | Wt 170.1 lb

## 2023-09-28 DIAGNOSIS — M722 Plantar fascial fibromatosis: Secondary | ICD-10-CM

## 2023-09-28 DIAGNOSIS — R972 Elevated prostate specific antigen [PSA]: Secondary | ICD-10-CM

## 2023-09-28 DIAGNOSIS — R109 Unspecified abdominal pain: Secondary | ICD-10-CM | POA: Diagnosis not present

## 2023-09-28 DIAGNOSIS — R7303 Prediabetes: Secondary | ICD-10-CM

## 2023-09-28 DIAGNOSIS — N183 Chronic kidney disease, stage 3 unspecified: Secondary | ICD-10-CM | POA: Diagnosis not present

## 2023-09-28 DIAGNOSIS — I129 Hypertensive chronic kidney disease with stage 1 through stage 4 chronic kidney disease, or unspecified chronic kidney disease: Secondary | ICD-10-CM | POA: Diagnosis not present

## 2023-09-28 NOTE — Patient Instructions (Addendum)
 Thank you for coming to the office today.  Kidneys improved PSA stable  A1c stable but still above goal No medication as discussed Goal to manage with lifestyle  Recent Labs    03/10/23 0917 09/15/23 0808  HGBA1C 6.6* 6.6*     Podiatry Queen Bruins, DPM  [address]  [place], [place] [address]  Phone: [phone]   Physical Therapy Lorelle Roll, PT  [address]  [place], [place] [address]  [phone] (Work)     Please schedule a Follow-up Appointment to: Return in about 5 months (around 02/28/2024).  If you have any other questions or concerns, please feel free to call the office or send a message through MyChart. You may also schedule an earlier appointment if necessary.  Additionally, you may be receiving a survey about your experience at our office within a few days to 1 week by e-mail or mail. We value your feedback.  Domingo Friend, DO [facility], [place]

## 2023-09-28 NOTE — Progress Notes (Unsigned)
 Subjective:    [name], male    DOB: 1950/11/25, 73 y.o.   MRN: [id number]  Jesse Figueroa is a 73 y.o. male presenting on 09/28/2023 for Hyperglycemia, A1c elevated and Plantar Fasciitis   HPI  He has been experiencing left lower abdominal pain for the past two weeks. The pain is described as annoying and uncomfortable, occurring sporadically multiple times a day. It is not constant and migrates, not localized to one spot. Currently, the pain is not present but was felt a few minutes ago.   He recalls similar symptoms in the past, with discussions about this issue dating back to 2019, 2020, and 2021. Previous evaluations include colonoscopies in 2017 and 2022, and an MRI which revealed a kidney cyst. These tests did not reveal any major colon or digestive issues.   No associated symptoms such as nausea, vomiting, diarrhea, dark stool, or blood in the stool. He also denies upper gastrointestinal symptoms like gas, bloating, belching, or heartburn. The pain does not interfere with digestion, and he has regular bowel movements without constipation or diarrhea.   He notes that eating seems to alleviate the pain, although he is uncertain about this observation. The pain is described as a sharp, gnawing sensation, likened to a 'hunger pain' or 'pulsing wave'.   He has not consulted a gastroenterologist specifically for this issue but has had a colonoscopy. He has not been taking any daily medications for digestion but recently started using a fiber supplement like Metamucil.   ***A1c 6.6, stable  Last seen 08/21/23 ***Referral to GI last time, scheduled with [facility] 11/20/23  Creatinine - normalized from 1.3 to 1.16  PSA 1.5, from prior 1.4  ***History Left Foot    Health Maintenance:  Last colonoscopy 05/13/21, repeat 5 years, 2027.     08/21/2023    9:47 AM 05/25/2023    2:41 PM 03/17/2023    8:05 AM  Depression screen PHQ 2/9  Decreased Interest 0 0 0  Down, Depressed,  Hopeless 0 0 0  PHQ - 2 Score 0 0 0  Altered sleeping  2 2  Tired, decreased energy  0 0  Change in appetite  0 0  Feeling bad or failure about yourself   0 0  Trouble concentrating  0 0  Moving slowly or fidgety/restless  0 0  Suicidal thoughts  0 0  PHQ-9 Score  2 2  Difficult doing work/chores   Not difficult at all       08/21/2023    9:47 AM 05/25/2023    2:41 PM 03/17/2023    8:08 AM 12/23/2022    1:20 PM  GAD 7 : Generalized Anxiety Score  Nervous, Anxious, on Edge 0 1 0 0  Control/stop worrying 0 1 0 0  Worry too much - different things 0 0 0 0  Trouble relaxing 0 0 0 0  Restless 0 0 0 0  Easily annoyed or irritable 0 0 0 0  Afraid - awful might happen 0 0 0 0  Total GAD 7 Score 0 2 0 0  Anxiety Difficulty   Not difficult at all Not difficult at all    Social History   Tobacco Use   Smoking status: Never   Smokeless tobacco: Never  Vaping Use   Vaping status: Never Used  Substance Use Topics   Alcohol use: No   Drug use: No    Review of Systems Per HPI unless specifically indicated above  Objective:    BP 134/78 (BP Location: Left Arm, Patient Position: Sitting, Cuff Size: Normal)   Pulse (!) 50   Ht 5\' 11"  (1.803 m)   Wt 170 lb 2 oz (77.2 kg)   BMI 23.73 kg/m   Wt Readings from Last 3 Encounters:  09/28/23 170 lb 2 oz (77.2 kg)  08/21/23 174 lb 9.6 oz (79.2 kg)  05/25/23 174 lb (78.9 kg)    Physical Exam  Results for orders placed or performed in visit on 09/15/23  BASIC METABOLIC PANEL WITH GFR   Collection Time: 09/15/23  8:08 AM  Result Value Ref Range   Glucose, Bld 105 (H) 65 - 99 mg/dL   BUN 16 7 - 25 mg/dL   Creat 1.61 0.96 - 0.45 mg/dL   BUN/Creatinine Ratio SEE NOTE: 6 - 22 (calc)   Sodium 140 135 - 146 mmol/L   Potassium 4.5 3.5 - 5.3 mmol/L   Chloride 104 98 - 110 mmol/L   CO2 30 20 - 32 mmol/L   Calcium 9.4 8.6 - 10.3 mg/dL  PSA   Collection Time: 09/15/23  8:08 AM  Result Value Ref Range   PSA 1.50 < OR = 4.00 ng/mL   Hemoglobin A1c   Collection Time: 09/15/23  8:08 AM  Result Value Ref Range   Hgb A1c MFr Bld 6.6 (H) <5.7 % of total Hgb   Mean Plasma Glucose 143 mg/dL   eAG (mmol/L) 7.9 mmol/L      Assessment & Plan:   Problem List Items Addressed This Visit     Benign hypertension with CKD (chronic kidney disease) stage III (HCC)   Pre-diabetes - Primary   Other Visit Diagnoses       Functional abdominal pain syndrome         Elevated PSA         Plantar fasciitis, left            ***  No orders of the defined types were placed in this encounter.   No orders of the defined types were placed in this encounter.   Follow up plan: Return in about 5 months (around 02/28/2024).  ***Future labs ordered for ***  A total of *** (lvl 3, 4, 5) 15, 25, 40 minutes was spent face-to-face with this patient. Greater than 50% (approximately *** minutes) of this time was spent in counseling and coordination of care with the patient. ***  Jesse Friend, DO [facility] 09/28/2023, 8:58 AM

## 2023-09-29 ENCOUNTER — Other Ambulatory Visit: Payer: Self-pay | Admitting: Family Medicine

## 2023-09-29 DIAGNOSIS — N183 Chronic kidney disease, stage 3 unspecified: Secondary | ICD-10-CM

## 2023-09-29 DIAGNOSIS — G250 Essential tremor: Secondary | ICD-10-CM

## 2023-09-29 DIAGNOSIS — Z0000 Encounter for general adult medical examination without abnormal findings: Secondary | ICD-10-CM

## 2023-09-29 DIAGNOSIS — R7303 Prediabetes: Secondary | ICD-10-CM

## 2023-09-29 DIAGNOSIS — E7849 Other hyperlipidemia: Secondary | ICD-10-CM

## 2023-09-29 DIAGNOSIS — R351 Nocturia: Secondary | ICD-10-CM

## 2023-10-01 ENCOUNTER — Telehealth: Payer: Self-pay

## 2023-10-01 NOTE — Telephone Encounter (Signed)
 Copied from CRM [id number]. Topic: Referral - Question >> Oct 01, 2023  3:48 PM Adaysia C wrote: Reason for CRM: Jesse Figueroa with [facility] called to inform the providers to fax a copy of the colonoscopy report that wasn't submitted with the referral request; they need this report as soon as possible or they will have to deny or close the referral; please fax report to #[phone]; please follow up with Jill if necessary [phone]

## 2023-10-16 ENCOUNTER — Ambulatory Visit (INDEPENDENT_AMBULATORY_CARE_PROVIDER_SITE_OTHER): Payer: Medicare HMO

## 2023-10-16 VITALS — BP 130/78 | Ht 71.0 in | Wt 168.2 lb

## 2023-10-16 DIAGNOSIS — Z0000 Encounter for general adult medical examination without abnormal findings: Secondary | ICD-10-CM

## 2023-10-16 NOTE — Patient Instructions (Addendum)
 Jesse Figueroa , Thank you for taking time to come for your Medicare Wellness Visit. I appreciate your ongoing commitment to your health goals. Please review the following plan we discussed and let me know if I can assist you in the future.   Referrals/Orders/Follow-Ups/Clinician Recommendations: NONE  This is a list of the screening recommended for you and due dates:  Health Maintenance  Topic Date Due   Zoster (Shingles) Vaccine (1 of 2) 06/17/1967   DTaP/Tdap/Td vaccine (2 - Td or Tdap) 06/16/2017   COVID-19 Vaccine (8 - Pfizer risk 2024-25 season) 09/21/2023   Flu Shot  01/15/2024   Medicare Annual Wellness Visit  10/15/2024   Colon Cancer Screening  05/13/2026   Pneumonia Vaccine  Completed   Hepatitis C Screening  Completed   HPV Vaccine  Aged Out   Meningitis B Vaccine  Aged Out    Advanced directives: (ACP Link)Information on Advanced Care Planning can be found at [place]  Best boy Advance Health Care Directives Advance Health Care Directives. [email or web address]   Next Medicare Annual Wellness Visit scheduled for next year: Yes  10/21/24 @ 8:10 AM IN PERSON  Have you seen your provider in the last 6 months (3 months if uncontrolled diabetes)? Yes

## 2023-10-16 NOTE — Progress Notes (Signed)
 Subjective:   Jesse Figueroa is a 73 y.o. who presents for a Medicare Wellness preventive visit.  Visit Complete: In person  Persons Participating in Visit: Patient.  AWV Questionnaire: No: Patient Medicare AWV questionnaire was not completed prior to this visit.  Cardiac Risk Factors include: advanced age (>9men, >32 women);dyslipidemia;male gender;hypertension     Objective:    Today's Vitals   10/16/23 0806  BP: 130/78  Weight: 168 lb 3.2 oz (76.3 kg)  Height: 5\' 11"  (1.803 m)   Body mass index is 23.46 kg/m.     10/16/2023    8:14 AM 10/10/2022    8:11 AM 10/04/2021    8:13 AM 05/13/2021    6:58 AM 10/02/2020    9:00 AM 02/08/2019    3:47 PM 09/15/2017   10:16 AM  Advanced Directives  Does Patient Have a Medical Advance Directive? No No No Yes Yes Yes Yes  Type of Agricultural consultant;Living will Living will;Healthcare Power of Attorney Living will  Copy of Healthcare Power of Attorney in Chart?     No - copy requested No - copy requested   Would patient like information on creating a medical advance directive? No - Patient declined No - Patient declined No - Patient declined        Current Medications (verified) Outpatient Encounter Medications as of 10/16/2023  Medication Sig   aspirin 81 MG chewable tablet Chew by mouth.   atorvastatin  (LIPITOR) 10 MG tablet Take 1 tablet (10 mg total) by mouth daily.   calcium  carbonate (TUMS - DOSED IN MG ELEMENTAL CALCIUM ) 500 MG chewable tablet Chew 1 tablet by mouth daily as needed for indigestion or heartburn.   diclofenac  Sodium (VOLTAREN ) 1 % GEL Apply 2 g topically 4 (four) times daily as needed (joint pain arthritis shoulder).   fluticasone  (FLONASE ) 50 MCG/ACT nasal spray USE 2 SPRAYS IN BOTH NOSTRILS DAILY. USE FOR 4 TO 6 WEEKS THEN STOP AND USE SEASONALLY OR AS NEEDED.   losartan  (COZAAR ) 100 MG tablet Take 1 tablet (100 mg total) by mouth daily.   propranolol  (INDERAL ) 20 MG tablet Take 1  tablet (20 mg total) by mouth 2 (two) times daily.   No facility-administered encounter medications on file as of 10/16/2023.    Allergies (verified) Patient has no known allergies.   History: Past Medical History:  Diagnosis Date   Age-related nuclear cataract of both eyes 05/11/2018   Arthritis of hand    Benign hypertension with CKD (chronic kidney disease), stage II    CKD (chronic kidney disease), stage II    Hyperlipidemia    Hypertension    Tremor    Past Surgical History:  Procedure Laterality Date   COLONOSCOPY N/A 05/13/2021   Procedure: COLONOSCOPY;  Surgeon: Shane Darling, MD;  Location: [facility] ENDOSCOPY;  Service: Endoscopy;  Laterality: N/A;   COLONOSCOPY WITH PROPOFOL  N/A 02/05/2016   Procedure: COLONOSCOPY WITH PROPOFOL ;  Surgeon: Deveron Fly, MD;  Location: [facility]COPY;  Service: Endoscopy;  Laterality: N/A;   EYE SURGERY  Lasic   Family History  Problem Relation Age of Onset   Heart disease Mother    Hypertension Mother    Diabetes Mother    Cancer Father    Colon cancer Son    Social History   Socioeconomic History   Marital status: Married    Spouse name: Not on file   Number of children: Not on file   Years of  education: Not on file   Highest education level: Bachelor's degree (e.g., BA, AB, BS)  Occupational History   Occupation: Hydrographic surveyor Track    Comment: Therapist, sports  Tobacco Use   Smoking status: Never   Smokeless tobacco: Never  Vaping Use   Vaping status: Never Used  Substance and Sexual Activity   Alcohol use: No   Drug use: No   Sexual activity: Yes  Other Topics Concern   Not on file  Social History Narrative   Track coach 5 days a week    Social Drivers of Corporate investment banker Strain: Low Risk  (10/16/2023)   Overall Financial Resource Strain (CARDIA)    Difficulty of Paying Living Expenses: Not hard at all  Food Insecurity: No Food Insecurity (10/16/2023)   Hunger Vital Sign    Worried  About Running Out of Food in the Last Year: Never true    Ran Out of Food in the Last Year: Never true  Transportation Needs: No Transportation Needs (10/16/2023)   PRAPARE - Administrator, Civil Service (Medical): No    Lack of Transportation (Non-Medical): No  Physical Activity: Sufficiently Active (10/16/2023)   Exercise Vital Sign    Days of Exercise per Week: 5 days    Minutes of Exercise per Session: 60 min  Stress: No Stress Concern Present (10/16/2023)   [employer] of Occupational Health - Occupational Stress Questionnaire    Feeling of Stress : Not at all  Social Connections: Moderately Integrated (10/16/2023)   Social Connection and Isolation Panel [NHANES]    Frequency of Communication with Friends and Family: More than three times a week    Frequency of Social Gatherings with Friends and Family: Once a week    Attends Religious Services: Never    Database administrator or Organizations: Yes    Attends Engineer, structural: More than 4 times per year    Marital Status: Married    Tobacco Counseling Counseling given: Not Answered    Clinical Intake:  Pre-visit preparation completed: Yes  Pain : No/denies pain     BMI - recorded: 23.46 Nutritional Status: BMI of 19-24  Normal Nutritional Risks: None Diabetes: No  Lab Results  Component Value Date   HGBA1C 6.6 (H) 09/15/2023   HGBA1C 6.6 (H) 03/10/2023   HGBA1C 6.7 (A) 09/15/2022     How often do you need to have someone help you when you read instructions, pamphlets, or other written materials from your doctor or pharmacy?: 1 - Never  Interpreter Needed?: No  Information entered by :: Dellie Fergusson, LPN   Activities of Daily Living    10/16/2023    8:15 AM 10/15/2023   10:23 AM  In your present state of health, do you have any difficulty performing the following activities:  Hearing? 0 0  Vision? 0 0  Difficulty concentrating or making decisions? 0 0  Walking or climbing stairs?  0 0  Dressing or bathing? 0 0  Doing errands, shopping? 0 0  Preparing Food and eating ? N N  Using the Toilet? N N  In the past six months, have you accidently leaked urine? N N  Do you have problems with loss of bowel control? N N  Managing your Medications? N N  Managing your Finances? N N  Housekeeping or managing your Housekeeping? N N    Patient Care Team: Raina Bunting, DO as PCP - General (Family Medicine) Dortha Gauss  S (Optometry)  Indicate any recent Medical Services you may have received from other than [facility] providers in the past year (date may be approximate).     Assessment:   This is a routine wellness examination for Deronte.  Hearing/Vision screen Hearing Screening - Comments:: NO AIDS Vision Screening - Comments:: READERS- HAVING CATARACT SGY AT END OF MONTH- DR.THURMOND   Goals Addressed             This Visit's Progress    DIET - REDUCE SUGAR INTAKE         Depression Screen     10/16/2023    8:12 AM 08/21/2023    9:47 AM 05/25/2023    2:41 PM 03/17/2023    8:05 AM 12/23/2022    1:20 PM 10/10/2022    8:09 AM 09/15/2022    9:00 AM  PHQ 2/9 Scores  PHQ - 2 Score 0 0 0 0 0 0 0  PHQ- 9 Score 1  2 2 3  0 3    Fall Risk     10/16/2023    8:15 AM 10/15/2023   10:23 AM 05/25/2023    2:41 PM 03/17/2023    8:09 AM 12/23/2022    1:21 PM  Fall Risk   Falls in the past year? 1 1 0 0 0  Number falls in past yr: 0 0   0  Injury with Fall? 0 0  0 0  Risk for fall due to : No Fall Risks    No Fall Risks  Follow up Falls prevention discussed;Falls evaluation completed    Falls evaluation completed    MEDICARE RISK AT HOME:  Medicare Risk at Home Any stairs in or around the home?: Yes If so, are there any without handrails?: No Home free of loose throw rugs in walkways, pet beds, electrical cords, etc?: Yes Adequate lighting in your home to reduce risk of falls?: Yes Life alert?: No Use of a cane, walker or w/c?: No Grab bars in the bathroom?:  Yes Shower chair or bench in shower?: No Elevated toilet seat or a handicapped toilet?: No  TIMED UP AND GO:  Was the test performed?  Yes  Length of time to ambulate 10 feet: 4 sec Gait steady and fast without use of assistive device  Cognitive Function: 6CIT completed        10/16/2023    8:17 AM 10/10/2022    8:14 AM 10/02/2020    9:02 AM 02/08/2019    3:52 PM 09/15/2017   10:20 AM  6CIT Screen  What Year? 0 points 0 points 0 points 0 points 0 points  What month? 0 points 0 points 0 points 0 points 0 points  What time? 0 points 0 points 0 points 0 points 0 points  Count back from 20 0 points 0 points 0 points 0 points 0 points  Months in reverse 0 points 0 points 0 points 0 points 0 points  Repeat phrase 2 points 0 points 2 points 0 points 2 points  Total Score 2 points 0 points 2 points 0 points 2 points    Immunizations Immunization History  Administered Date(s) Administered   Fluad Quad(high Dose 65+) 02/08/2019, 03/27/2020, 03/20/2021, 03/13/2022   Fluad Trivalent(High Dose 65+) 03/17/2023   Influenza, High Dose Seasonal PF 03/06/2016, 06/17/2017   Influenza,inj,quad, With Preservative 03/16/2016   Influenza-Unspecified 03/16/2013, 04/07/2015, 02/04/2018   PFIZER(Purple Top)SARS-COV-2 Vaccination 07/18/2019, 08/08/2019, 04/04/2020, 11/19/2020   Pfizer Covid-19 Vaccine Bivalent Booster 37yrs & up 04/02/2021  Pfizer(Comirnaty)Fall Seasonal Vaccine 12 years and older 04/04/2022, 03/23/2023   Pneumococcal Conjugate-13 04/09/2015   Pneumococcal Polysaccharide-23 05/01/2016   Pneumococcal-Unspecified 03/16/2016   Tdap 06/17/2007   Zoster, Live 06/16/2013    Screening Tests Health Maintenance  Topic Date Due   Zoster Vaccines- Shingrix (1 of 2) 06/17/1967   DTaP/Tdap/Td (2 - Td or Tdap) 06/16/2017   COVID-19 Vaccine (8 - Pfizer risk 2024-25 season) 09/21/2023   INFLUENZA VACCINE  01/15/2024   Medicare Annual Wellness (AWV)  10/15/2024   Colonoscopy  05/13/2026    Pneumonia Vaccine 3+ Years old  Completed   Hepatitis C Screening  Completed   HPV VACCINES  Aged Out   Meningococcal B Vaccine  Aged Out    Health Maintenance  Health Maintenance Due  Topic Date Due   Zoster Vaccines- Shingrix (1 of 2) 06/17/1967   DTaP/Tdap/Td (2 - Td or Tdap) 06/16/2017   COVID-19 Vaccine (8 - Pfizer risk 2024-25 season) 09/21/2023   Health Maintenance Items Addressed: UP TO DATE ON SHOTS EXCEPT TDAP; AGED OUT OF COLONOSCOPY  Additional Screening:  Vision Screening: Recommended annual ophthalmology exams for early detection of glaucoma and other disorders of the eye.  Dental Screening: Recommended annual dental exams for proper oral hygiene  Community Resource Referral / Chronic Care Management: CRR required this visit?  No   CCM required this visit?  No     Plan:     I have personally reviewed and noted the following in the patient's chart:   Medical and social history Use of alcohol, tobacco or illicit drugs  Current medications and supplements including opioid prescriptions. Patient is not currently taking opioid prescriptions. Functional ability and status Nutritional status Physical activity Advanced directives List of other physicians Hospitalizations, surgeries, and ER visits in previous 12 months Vitals Screenings to include cognitive, depression, and falls Referrals and appointments  In addition, I have reviewed and discussed with patient certain preventive protocols, quality metrics, and best practice recommendations. A written personalized care plan for preventive services as well as general preventive health recommendations were provided to patient.     Pinky Bright, LPN   06/21/1094   After Visit Summary: (In Person-Declined) Patient declined AVS at this time.  Notes: Nothing significant to report at this time.

## 2023-11-06 ENCOUNTER — Ambulatory Visit (INDEPENDENT_AMBULATORY_CARE_PROVIDER_SITE_OTHER): Admitting: Internal Medicine

## 2023-11-06 ENCOUNTER — Encounter: Payer: Self-pay | Admitting: Internal Medicine

## 2023-11-06 VITALS — BP 122/74 | Ht 71.0 in | Wt 167.6 lb

## 2023-11-06 DIAGNOSIS — D367 Benign neoplasm of other specified sites: Secondary | ICD-10-CM | POA: Diagnosis not present

## 2023-11-06 NOTE — Progress Notes (Signed)
 Subjective:    [name], male    DOB: [birth year], 73 y.o.   MRN: [id number]  HPI  Discussed the use of AI scribe software for clinical note transcription with the patient, who gave verbal consent to proceed.  Jesse Figueroa "Jesse Figueroa" is a 73 year old male who presents with a cyst on his elbow.  He has a small cyst on his elbow that he noticed a couple of nights ago. The cyst has not increased in size and does not cause pain or restrict elbow movement. He does not recall any trauma to the elbow.  He has not noticed any redness, swelling or drainage from the area.  Review of Systems   Past Medical History:  Diagnosis Date   Age-related nuclear cataract of both eyes 05/11/2018   Arthritis of hand    Benign hypertension with CKD (chronic kidney disease), stage II    CKD (chronic kidney disease), stage II    Hyperlipidemia    Hypertension    Tremor     Current Outpatient Medications  Medication Sig Dispense Refill   aspirin 81 MG chewable tablet Chew by mouth.     atorvastatin  (LIPITOR) 10 MG tablet Take 1 tablet (10 mg total) by mouth daily. 90 tablet 3   calcium  carbonate (TUMS - DOSED IN MG ELEMENTAL CALCIUM ) 500 MG chewable tablet Chew 1 tablet by mouth daily as needed for indigestion or heartburn.     diclofenac  Sodium (VOLTAREN ) 1 % GEL Apply 2 g topically 4 (four) times daily as needed (joint pain arthritis shoulder). 100 g 2   fluticasone  (FLONASE ) 50 MCG/ACT nasal spray USE 2 SPRAYS IN BOTH NOSTRILS DAILY. USE FOR 4 TO 6 WEEKS THEN STOP AND USE SEASONALLY OR AS NEEDED. 48 g 1   losartan  (COZAAR ) 100 MG tablet Take 1 tablet (100 mg total) by mouth daily. 90 tablet 3   propranolol  (INDERAL ) 20 MG tablet Take 1 tablet (20 mg total) by mouth 2 (two) times daily. 60 tablet 2   No current facility-administered medications for this visit.    No Known Allergies  Family History  Problem Relation Age of Onset   Heart disease Mother    Hypertension  Mother    Diabetes Mother    Cancer Father    Colon cancer Son     Social History   Socioeconomic History   Marital status: Married    Spouse name: Not on file   Number of children: Not on file   Years of education: Not on file   Highest education level: Bachelor's degree (e.g., BA, AB, BS)  Occupational History   Occupation: Hydrographic surveyor Track    Comment: Therapist, sports  Tobacco Use   Smoking status: Never   Smokeless tobacco: Never  Vaping Use   Vaping status: Never Used  Substance and Sexual Activity   Alcohol use: No   Drug use: No   Sexual activity: Yes  Other Topics Concern   Not on file  Social History Narrative   Track coach 5 days a week    Social Drivers of Corporate investment banker Strain: Low Risk  (10/16/2023)   Overall Financial Resource Strain (CARDIA)    Difficulty of Paying Living Expenses: Not hard at all  Food Insecurity: No Food Insecurity (10/16/2023)   Hunger Vital Sign    Worried About Running Out of Food in the Last Year: Never true    Ran Out of Food in  the Last Year: Never true  Transportation Needs: No Transportation Needs (10/16/2023)   PRAPARE - Administrator, Civil Service (Medical): No    Lack of Transportation (Non-Medical): No  Physical Activity: Sufficiently Active (10/16/2023)   Exercise Vital Sign    Days of Exercise per Week: 5 days    Minutes of Exercise per Session: 60 min  Stress: No Stress Concern Present (10/16/2023)   [employer] of Occupational Health - Occupational Stress Questionnaire    Feeling of Stress : Not at all  Social Connections: Moderately Integrated (10/16/2023)   Social Connection and Isolation Panel [NHANES]    Frequency of Communication with Friends and Family: More than three times a week    Frequency of Social Gatherings with Friends and Family: Once a week    Attends Religious Services: Never    Database administrator or Organizations: Yes    Attends Hospital doctor: More than 4 times per year    Marital Status: Married  Catering manager Violence: Not At Risk (10/16/2023)   Humiliation, Afraid, Rape, and Kick questionnaire    Fear of Current or Ex-Partner: No    Emotionally Abused: No    Physically Abused: No    Sexually Abused: No     Constitutional: Denies fever, malaise, fatigue, headache or abrupt weight changes.  Respiratory: Denies difficulty breathing, shortness of breath, cough or sputum production.   Cardiovascular: Denies chest pain, chest tightness, palpitations or swelling in the hands or feet.  Musculoskeletal: Denies decrease in range of motion, difficulty with gait, muscle pain or joint pain and swelling.  Skin: Pt reports mass of left elbow. Denies redness, rashes, or ulcercations.    No other specific complaints in a complete review of systems (except as listed in HPI above).      Objective:   Physical Exam  BP 122/74 (BP Location: Right Arm, Patient Position: Sitting, Cuff Size: Normal)   Ht 5\' 11"  (1.803 m)   Wt 167 lb 9.6 oz (76 kg)   BMI 23.38 kg/m   Wt Readings from Last 3 Encounters:  10/16/23 168 lb 3.2 oz (76.3 kg)  09/28/23 170 lb 2 oz (77.2 kg)  08/21/23 174 lb 9.6 oz (79.2 kg)    General: Appears his stated age, well developed, well nourished in NAD. Skin: Warm, dry and intact. 1.5 cm round, smooth,, mobile cyst noted of medial left elbow.  Cardiovascular: Normal rate. Pulmonary/Chest: Normal effort. Musculoskeletal: Normal flexion, extension and rotation of the left elbow. No signs of joint swelling.  Neurological: Alert and oriented.   BMET    Component Value Date/Time   NA 140 09/15/2023 0808   NA 142 10/15/2015 0914   K 4.5 09/15/2023 0808   CL 104 09/15/2023 0808   CO2 30 09/15/2023 0808   GLUCOSE 105 (H) 09/15/2023 0808   BUN 16 09/15/2023 0808   BUN 11 10/15/2015 0914   CREATININE 1.16 09/15/2023 0808   CALCIUM  9.4 09/15/2023 0808   GFRNONAA 61 11/16/2020 0856   GFRAA 70  11/16/2020 0856    Lipid Panel     Component Value Date/Time   CHOL 146 03/10/2023 0917   CHOL 121 10/15/2015 0914   TRIG 76 03/10/2023 0917   HDL 54 03/10/2023 0917   HDL 47 10/15/2015 0914   CHOLHDL 2.7 03/10/2023 0917   VLDL 24 08/28/2016 0001   LDLCALC 76 03/10/2023 0917    CBC    Component Value Date/Time   WBC 4.1 03/10/2023  0917   RBC 5.23 03/10/2023 0917   HGB 14.3 03/10/2023 0917   HGB 14.6 04/20/2015 0802   HCT 45.9 03/10/2023 0917   HCT 43.3 04/20/2015 0802   PLT 314 03/10/2023 0917   PLT 327 04/20/2015 0802   MCV 87.8 03/10/2023 0917   MCV 83 04/20/2015 0802   MCH 27.3 03/10/2023 0917   MCHC 31.2 (L) 03/10/2023 0917   RDW 13.6 03/10/2023 0917   RDW 14.8 04/20/2015 0802   LYMPHSABS 1,763 03/10/2023 0917   LYMPHSABS 2.3 04/20/2015 0802   MONOABS 440 08/28/2016 0001   EOSABS 180 03/10/2023 0917   EOSABS 0.3 04/20/2015 0802   BASOSABS 29 03/10/2023 0917   BASOSABS 0.1 04/20/2015 0802    Hgb A1C Lab Results  Component Value Date   HGBA1C 6.6 (H) 09/15/2023            Assessment & Plan:  Assessment and Plan     Cyst of left upper extremity Small, non-painful elbow cyst with no infection or trauma. Discussed that intervention is unnecessary unless symptomatic. - Monitor for changes in size, pain, or movement restriction.      Follow-up with your PCP as previously scheduled Helayne Lo, NP

## 2023-11-12 ENCOUNTER — Ambulatory Visit: Admitting: Gastroenterology

## 2023-11-12 DIAGNOSIS — H2512 Age-related nuclear cataract, left eye: Secondary | ICD-10-CM | POA: Diagnosis not present

## 2023-11-13 DIAGNOSIS — H25011 Cortical age-related cataract, right eye: Secondary | ICD-10-CM | POA: Diagnosis not present

## 2023-11-13 DIAGNOSIS — H2511 Age-related nuclear cataract, right eye: Secondary | ICD-10-CM | POA: Diagnosis not present

## 2023-11-13 DIAGNOSIS — H25041 Posterior subcapsular polar age-related cataract, right eye: Secondary | ICD-10-CM | POA: Diagnosis not present

## 2023-11-20 ENCOUNTER — Ambulatory Visit: Admitting: Gastroenterology

## 2023-12-03 DIAGNOSIS — H2511 Age-related nuclear cataract, right eye: Secondary | ICD-10-CM | POA: Diagnosis not present

## 2023-12-09 DIAGNOSIS — M79672 Pain in left foot: Secondary | ICD-10-CM | POA: Diagnosis not present

## 2023-12-09 DIAGNOSIS — M722 Plantar fascial fibromatosis: Secondary | ICD-10-CM | POA: Diagnosis not present

## 2023-12-09 DIAGNOSIS — E119 Type 2 diabetes mellitus without complications: Secondary | ICD-10-CM | POA: Diagnosis not present

## 2023-12-09 DIAGNOSIS — M216X1 Other acquired deformities of right foot: Secondary | ICD-10-CM | POA: Diagnosis not present

## 2023-12-09 DIAGNOSIS — M216X2 Other acquired deformities of left foot: Secondary | ICD-10-CM | POA: Diagnosis not present

## 2024-01-07 ENCOUNTER — Encounter: Payer: Self-pay | Admitting: Gastroenterology

## 2024-01-07 ENCOUNTER — Ambulatory Visit: Admitting: Gastroenterology

## 2024-01-07 ENCOUNTER — Ambulatory Visit: Payer: Self-pay | Admitting: Gastroenterology

## 2024-01-07 ENCOUNTER — Ambulatory Visit (INDEPENDENT_AMBULATORY_CARE_PROVIDER_SITE_OTHER)
Admission: RE | Admit: 2024-01-07 | Discharge: 2024-01-07 | Disposition: A | Discharge status: Home / Self Care | Source: Ambulatory Visit | Attending: Gastroenterology

## 2024-01-07 VITALS — BP 136/80 | HR 48 | Ht 72.0 in | Wt 164.1 lb

## 2024-01-07 DIAGNOSIS — K5904 Chronic idiopathic constipation: Secondary | ICD-10-CM

## 2024-01-07 DIAGNOSIS — R1030 Lower abdominal pain, unspecified: Secondary | ICD-10-CM

## 2024-01-07 NOTE — Patient Instructions (Addendum)
 Recommend high fiber diet Fiber One cereal  Start Citrucel 1 tsp po daily - purchase over the counter Squatty potty- helps with bowel movements  Your provider has requested that you have an abdominal x ray before leaving today. Please go to the basement floor to our Radiology department for the test.  _______________________________________________________  If your blood pressure at your visit was 140/90 or greater, please contact your primary care physician to follow up on this.  _______________________________________________________  If you are age 22 or older, your body mass index should be between 23-30. Your Body mass index is 22.26 kg/m. If this is out of the aforementioned range listed, please consider follow up with your Primary Care Provider.  If you are age 23 or younger, your body mass index should be between 19-25. Your Body mass index is 22.26 kg/m. If this is out of the aformentioned range listed, please consider follow up with your Primary Care Provider.   ________________________________________________________  The [facility] providers would like to encourage you to use MYCHART to communicate with providers for non-urgent requests or questions.  Due to long hold times on the telephone, sending your provider a message by [facility] may be a faster and more efficient way to get a response.  Please allow 48 business hours for a response.  Please remember that this is for non-urgent requests.  _______________________________________________________  [facility] is using a team-based approach to care.  Your team is made up of your doctor and two to three APPS. Our APPS (Nurse Practitioners and Physician Assistants) work with your physician to ensure care continuity for you. They are fully qualified to address your health concerns and develop a treatment plan. They communicate directly with your gastroenterologist to care for you. Seeing the Advanced Practice  Practitioners on your physician's team can help you by facilitating care more promptly, often allowing for earlier appointments, access to diagnostic testing, procedures, and other specialty referrals.   Thank you for trusting me with your gastrointestinal care. Deanna May, NP-C

## 2024-01-07 NOTE — Progress Notes (Signed)
 Chief Complaint:chronic abd pain Primary GI Doctor: Dr. Federico  HPI:  Patient is a  74  year old male patient with past medical history of CKD stage 2, hyperlipidemia, hypertension, who was referred to me by Jesse Figueroa * on 08/27/23 for a second opinion and evaluation of chronic abd pain .    Patient has been seen in GI clinic at [facility] 8/22 and [facility] 8/20-12/20. Diagnosed with functional dyspepsia and abdominal pain. Treatments given: Ibgard, bentyl , Dexilant, high fiber diet, consider Levsin or amitriptyline. MRI with  benign liver hemangioma  and cystic lesion on right kidney.   Interval History    Patient presents for evaluation of lower abdominal pain he states started a few months ago. He describes it as a mild cramping type of pain. Patient reports the pain is intermittent and occurs weekly and Jesse Figueroa last few days. Patient reports he feels the pain is worse when he has constipation. He typically has 2-3 bowel movements daily, he reports most of the time it is significant amount. Not taking any fiber supplements. No blood in stool. No gas or bloat. No food triggers. Patient denies nausea, vomiting, or weight loss  Denies alcohol use. Nonsmoker.   Surgical history: none  Patient's family history includes: son with prostate CA  Wt Readings from Last 3 Encounters:  01/07/24 164 lb 2 oz (74.4 kg)  11/06/23 167 lb 9.6 oz (76 kg)  10/16/23 168 lb 3.2 oz (76.3 kg)   Past Medical History:  Diagnosis Date   Age-related nuclear cataract of both eyes 05/11/2018   Arthritis of hand    Benign hypertension with CKD (chronic kidney disease), stage II    CKD (chronic kidney disease), stage II    Hyperlipidemia    Hypertension    Tremor     Past Surgical History:  Procedure Laterality Date   COLONOSCOPY N/A 05/13/2021   Procedure: COLONOSCOPY;  Surgeon: Jesse Jesse Figueroa DASEN, MD;  Location: [facility] ENDOSCOPY;  Service: Endoscopy;  Laterality: N/A;   COLONOSCOPY WITH  PROPOFOL  N/A 02/05/2016   Procedure: COLONOSCOPY WITH PROPOFOL ;  Surgeon: Jesse Jesse Figueroa Mariner, MD;  Location: [facility] ENDOSCOPY;  Service: Endoscopy;  Laterality: N/A;   EYE SURGERY  Lasic    Current Outpatient Medications  Medication Sig Dispense Refill   aspirin 81 MG chewable tablet Chew by mouth.     atorvastatin  (LIPITOR) 10 MG tablet Take 1 tablet (10 mg total) by mouth daily. 90 tablet 3   calcium  carbonate (TUMS - DOSED IN MG ELEMENTAL CALCIUM ) 500 MG chewable tablet Chew 1 tablet by mouth daily as needed for indigestion or heartburn.     diclofenac  Sodium (VOLTAREN ) 1 % GEL Apply 2 g topically 4 (four) times daily as needed (joint pain arthritis shoulder). 100 g 2   fluticasone  (FLONASE ) 50 MCG/ACT nasal spray USE 2 SPRAYS IN BOTH NOSTRILS DAILY. USE FOR 4 TO 6 WEEKS THEN STOP AND USE SEASONALLY OR AS NEEDED. 48 g 1   losartan  (COZAAR ) 100 MG tablet Take 1 tablet (100 mg total) by mouth daily. 90 tablet 3   propranolol  (INDERAL ) 20 MG tablet Take 1 tablet (20 mg total) by mouth 2 (two) times daily. 60 tablet 2   No current facility-administered medications for this visit.    Allergies as of 01/07/2024   (No Known Allergies)    Family History  Problem Relation Age of Onset   Heart disease Mother    Hypertension Mother    Diabetes Mother    Cancer  Father    Colon cancer Son     Review of Systems:    Constitutional: No weight loss, fever, chills, weakness or fatigue HEENT: Eyes: No change in vision               Ears, Nose, Throat:  No change in hearing or congestion Skin: No rash or itching Cardiovascular: No chest pain, chest pressure or palpitations   Respiratory: No SOB or cough Gastrointestinal: See HPI and otherwise negative Genitourinary: No dysuria or change in urinary frequency Neurological: No headache, dizziness or syncope Musculoskeletal: No new muscle or joint pain Hematologic: No bleeding or bruising Psychiatric: No history of depression or anxiety     Physical Exam:  Vital signs: BP 136/80 (BP Location: Left Arm, Patient Position: Sitting, Cuff Size: Normal)   Pulse (!) 48   Ht 6' (1.829 m)   Wt 164 lb 2 oz (74.4 kg)   BMI 22.26 kg/m   Constitutional:   Pleasant male appears to be in NAD, Well developed, Well nourished, alert and cooperative Respiratory: Respirations even and unlabored. Lungs clear to auscultation bilaterally.   No wheezes, crackles, or rhonchi.  Cardiovascular: Normal S1, S2. Regular rate and rhythm. No peripheral edema, cyanosis or pallor.  Gastrointestinal:  Soft, nondistended, nontender. No rebound or guarding. Hyoactive bowel sounds. No appreciable masses or hepatomegaly. Rectal:  Not performed.  Msk:  Symmetrical without gross deformities. Without edema, no deformity or joint abnormality.  Neurologic:  Alert and  oriented x4;  grossly normal neurologically.  Skin:   Dry and intact without significant lesions or rashes.  RELEVANT LABS AND IMAGING: CBC    Latest Ref Rng & Units 03/10/2023    9:17 AM 03/13/2022    9:40 AM 11/16/2020    8:56 AM  CBC  WBC 3.8 - 10.8 Thousand/uL 4.1  3.7  4.3   Hemoglobin 13.2 - 17.1 g/dL 85.6  85.4  86.0   Hematocrit 38.5 - 50.0 % 45.9  44.5  43.6   Platelets 140 - 400 Thousand/uL 314  307  296      CMP     Latest Ref Rng & Units 09/15/2023    8:08 AM 03/10/2023    9:17 AM 03/13/2022    9:40 AM  CMP  Glucose 65 - 99 mg/dL 894  888  892   BUN 7 - 25 mg/dL 16  16  14    Creatinine 0.70 - 1.28 mg/dL 8.83  8.68  8.82   Sodium 135 - 146 mmol/L 140  140  139   Potassium 3.5 - 5.3 mmol/L 4.5  4.3  4.3   Chloride 98 - 110 mmol/L 104  105  103   CO2 20 - 32 mmol/L 30  29  29    Calcium  8.6 - 10.3 mg/dL 9.4  9.7  9.5   Total Protein 6.1 - 8.1 g/dL  7.0  7.0   Total Bilirubin 0.2 - 1.2 mg/dL  0.5  0.8   AST 10 - 35 U/L  29  21   ALT 9 - 46 U/L  36  28      Lab Results  Component Value Date   TSH 2.31 03/10/2023  GI Procedures: 04/2021 colonoscopy with Dr. Maryruth at [place],  recall 5 years - One 3 mm polyp in the ascending colon, removed with a cold snare. Resected and retrieved. - Internal hemorrhoids.  - The examination was otherwise normal on direct and retroflexion views. Path: DIAGNOSIS:  A. COLON POLYP, ASCENDING; COLD SNARE:  -  TUBULAR ADENOMA.  - NEGATIVE FOR HIGH-GRADE DYSPLASIA AND MALIGNANCY.  01/2016 colonoscopy with Dr. Gaylyn at [facility] - Two 1 to 2 mm polyps at the recto- sigmoid colon.  - Diverticulosis in the sigmoid colon and in the distal descending colon.  - The distal rectum and anal verge are normal on retroflexion view. - No specimens collected. Path: DIAGNOSIS:  A. COLON POLYP 3, RECTOSIGMOID; COLD BIOPSY:  - PROMINENT LYMPHOID AGGREGATE (1).  - HYPERPLASTIC EPITHELIAL CHANGE (2).  - NEGATIVE FOR DYSPLASIA AND MALIGNANCY.  - MULTIPLE DEEPER LEVELS WERE EXAMINED.   Colonoscopy 2004- hyperplastic polyp   Imaging: 03/28/2019 CTAP IMPRESSION: 1. No acute findings are noted in the abdomen or pelvis to account for the patient's symptoms. 2. There are 2 indeterminate lesions in the liver, and multiple indeterminate lesions in the spleen. Further characterization with nonemergent MRI of the abdomen with and without IV gadolinium is recommended in the near future to better evaluate these findings. 3. Aortic atherosclerosis. 4. There are calcifications of the aortic valve. Echocardiographic correlation for evaluation of potential valvular dysfunction Amaya Blakeman be warranted if clinically indicated. 5. Multiple bilateral renal cysts, as above. 04/18/2019 MRI ABD IMPRESSION: Small benign hemangioma and focal nodular hyperplasia in the right hepatic lobe, corresponding with lesions seen on recent CT.   Multiple benign splenic hemangiomas, corresponding with lesions seen on recent CT.   3.5 cm probably benign Bosniak category 60F cystic lesion in upper pole of right kidney. Recommend continued follow-up by MRI in 6 months. This  recommendation follows ACR consensus guidelines: Management of the Incidental Renal Mass on CT: A White Paper of the ACR Incidental Findings Committee. J Am Coll Radiol [phone]. 12/06/19 MRI ABD IMPRESSION: 1. Stable appearance of Bosniak class 60F cyst in the upper pole of the right kidney, strongly suggestive of a benign lesion. Repeat abdominal MRI with and without IV gadolinium is recommended in 6 months to ensure continued stability. This recommendation follows ACR consensus guidelines: Management of the Incidental Renal Mass on CT: A White Paper of the ACR Incidental Findings Committee. J Am Coll Radiol [phone]. 2. Small cavernous hemangioma in segment 6 of the liver incidentally noted. 3. Multiple small lesions in the spleen again favored to represent flash fill cavernous hemangiomas.  Assessment: Encounter Diagnoses  Name Primary?   Chronic idiopathic constipation Yes   Lower abdominal pain      73 year old male patient who presents for chronic constipation and lower abdominal pain. He reports the cramping is worse when he more constipated, but feels he empties out most days. Will order Abd xray today to evaluate for stool burden. Will start patient on high fiber diet and Citrucel. UTD on colon screening, due November 2027. Has had imaging in past for abdominal pain and negative. If no improvement with increasing fiber Jewett Mcgann consider adding OTC Miralax.   Plan: -Recommend high fiber -Start Citrucel 1 tsp po daily  -Order Abd xray KUB 2 view  Thank you for the courtesy of this consult. Please call me with any questions or concerns.   Deziyah Arvin, FNP-C [facility] 01/07/2024, 8:56 AM  Cc: Jesse Figueroa *

## 2024-01-08 NOTE — Progress Notes (Signed)
 I agree with the assessment and plan as outlined by Ms. May.

## 2024-02-04 ENCOUNTER — Other Ambulatory Visit: Payer: Self-pay | Admitting: Family Medicine

## 2024-02-04 DIAGNOSIS — G250 Essential tremor: Secondary | ICD-10-CM

## 2024-02-05 NOTE — Telephone Encounter (Signed)
 Requested Prescriptions  Pending Prescriptions Disp Refills   propranolol  (INDERAL ) 20 MG tablet [Pharmacy Med Name: PROPRANOLOL  20 MG TABLET] 180 tablet 0    Sig: TAKE 1 TABLET BY MOUTH TWICE A DAY     Cardiovascular:  Beta Blockers Passed - 02/05/2024  8:09 AM      Passed - Last BP in normal range    BP Readings from Last 1 Encounters:  01/07/24 136/80         Passed - Last Heart Rate in normal range    Pulse Readings from Last 1 Encounters:  01/07/24 (!) 48         Passed - Valid encounter within last 6 months    Recent Outpatient Visits           3 months ago Dermoid cyst of upper extremity, left   [place] [facility] [place], Angeline ORN, NP   4 months ago Pre-diabetes   [place] [facility] [place] PARAS, DO   5 months ago Functional abdominal pain syndrome   [place] [facility] [place], Marsa PARAS, [place]

## 2024-02-29 ENCOUNTER — Encounter: Payer: Self-pay | Admitting: Gastroenterology

## 2024-03-03 ENCOUNTER — Other Ambulatory Visit: Payer: Self-pay | Admitting: Family Medicine

## 2024-03-03 DIAGNOSIS — E7849 Other hyperlipidemia: Secondary | ICD-10-CM

## 2024-03-03 NOTE — Telephone Encounter (Signed)
 Requested Prescriptions  Pending Prescriptions Disp Refills   atorvastatin  (LIPITOR) 10 MG tablet [Pharmacy Med Name: ATORVASTATIN  10 MG TABLET] 90 tablet 0    Sig: TAKE 1 TABLET BY MOUTH EVERY DAY     Cardiovascular:  Antilipid - Statins Failed - 03/03/2024  4:05 PM      Failed - Lipid Panel in normal range within the last 12 months    Cholesterol, Total  Date Value Ref Range Status  10/15/2015 121 100 - 199 mg/dL Final   Cholesterol  Date Value Ref Range Status  03/10/2023 146 <200 mg/dL Final   LDL Cholesterol (Calc)  Date Value Ref Range Status  03/10/2023 76 mg/dL (calc) Final    Comment:    Reference range: <100 . Desirable range <100 mg/dL for primary prevention;   <70 mg/dL for patients with CHD or diabetic patients  with > or = 2 CHD risk factors. SABRA LDL-C is now calculated using the Martin-Hopkins  calculation, which is a validated novel method providing  better accuracy than the Friedewald equation in the  estimation of LDL-C.  Gladis APPLETHWAITE et al. SANDREA. 7986;689(80): 2061-2068  (http://education.[facility].com/faq/FAQ164)    HDL  Date Value Ref Range Status  03/10/2023 54 > OR = 40 mg/dL Final  94/98/7982 47 >60 mg/dL Final   Triglycerides  Date Value Ref Range Status  03/10/2023 76 <150 mg/dL Final         Passed - Patient is not pregnant      Passed - Valid encounter within last 12 months    Recent Outpatient Visits           3 months ago Dermoid cyst of upper extremity, left   [facility] [place], Angeline ORN, NP   5 months ago Pre-diabetes   [place] [facility] [place] PARAS, [place]   6 months ago Functional abdominal pain syndrome   [place] [facility] [place], Marsa PARAS, [place]

## 2024-03-04 ENCOUNTER — Other Ambulatory Visit: Payer: Self-pay

## 2024-03-04 DIAGNOSIS — I10 Essential (primary) hypertension: Secondary | ICD-10-CM

## 2024-03-04 MED ORDER — LOSARTAN POTASSIUM 100 MG PO TABS: 100.0000 mg | ORAL_TABLET | Freq: Every day | ORAL | 3 refills | Status: DC

## 2024-03-15 ENCOUNTER — Other Ambulatory Visit

## 2024-03-15 DIAGNOSIS — I129 Hypertensive chronic kidney disease with stage 1 through stage 4 chronic kidney disease, or unspecified chronic kidney disease: Secondary | ICD-10-CM | POA: Diagnosis not present

## 2024-03-15 DIAGNOSIS — G250 Essential tremor: Secondary | ICD-10-CM | POA: Diagnosis not present

## 2024-03-15 DIAGNOSIS — R7303 Prediabetes: Secondary | ICD-10-CM

## 2024-03-15 DIAGNOSIS — N183 Chronic kidney disease, stage 3 unspecified: Secondary | ICD-10-CM | POA: Diagnosis not present

## 2024-03-15 DIAGNOSIS — Z0000 Encounter for general adult medical examination without abnormal findings: Secondary | ICD-10-CM

## 2024-03-15 DIAGNOSIS — R351 Nocturia: Secondary | ICD-10-CM | POA: Diagnosis not present

## 2024-03-15 DIAGNOSIS — E7849 Other hyperlipidemia: Secondary | ICD-10-CM

## 2024-03-16 LAB — CBC WITH DIFFERENTIAL/PLATELET
Absolute Lymphocytes: 1632 {cells}/uL (ref 850–3900)
Absolute Monocytes: 572 {cells}/uL (ref 200–950)
Basophils Absolute: 32 {cells}/uL (ref 0–200)
Basophils Relative: 0.6 %
Eosinophils Absolute: 111 {cells}/uL (ref 15–500)
Eosinophils Relative: 2.1 %
HCT: 44.3 % (ref 38.5–50.0)
Hemoglobin: 13.9 g/dL (ref 13.2–17.1)
MCH: 27.3 pg (ref 27.0–33.0)
MCHC: 31.4 g/dL — ABNORMAL LOW (ref 32.0–36.0)
MCV: 87 fL (ref 80.0–100.0)
MPV: 10.9 fL (ref 7.5–12.5)
Monocytes Relative: 10.8 %
Neutro Abs: 2952 {cells}/uL (ref 1500–7800)
Neutrophils Relative %: 55.7 %
Platelets: 275 Thousand/uL (ref 140–400)
RBC: 5.09 Million/uL (ref 4.20–5.80)
RDW: 13.3 % (ref 11.0–15.0)
Total Lymphocyte: 30.8 %
WBC: 5.3 Thousand/uL (ref 3.8–10.8)

## 2024-03-16 LAB — MICROALBUMIN / CREATININE URINE RATIO
Creatinine, Urine: 168 mg/dL (ref 20–320)
Microalb Creat Ratio: 4 mg/g{creat} (ref <30)
Microalb, Ur: 0.6 mg/dL

## 2024-03-16 LAB — COMPREHENSIVE METABOLIC PANEL WITH GFR
AG Ratio: 1.5 (calc) (ref 1.0–2.5)
ALT: 16 U/L (ref 9–46)
AST: 14 U/L (ref 10–35)
Albumin: 4 g/dL (ref 3.6–5.1)
Alkaline phosphatase (APISO): 92 U/L (ref 35–144)
BUN: 18 mg/dL (ref 7–25)
CO2: 30 mmol/L (ref 20–32)
Calcium: 9.3 mg/dL (ref 8.6–10.3)
Chloride: 104 mmol/L (ref 98–110)
Creat: 1.15 mg/dL (ref 0.70–1.28)
Globulin: 2.6 g/dL (ref 1.9–3.7)
Glucose, Bld: 97 mg/dL (ref 65–99)
Potassium: 4.2 mmol/L (ref 3.5–5.3)
Sodium: 140 mmol/L (ref 135–146)
Total Bilirubin: 0.7 mg/dL (ref 0.2–1.2)
Total Protein: 6.6 g/dL (ref 6.1–8.1)
eGFR: 66 mL/min/1.73m2 (ref >=60)

## 2024-03-16 LAB — LIPID PANEL
Cholesterol: 133 mg/dL (ref <200)
HDL: 52 mg/dL (ref >=40)
LDL Cholesterol (Calc): 65 mg/dL
Non-HDL Cholesterol (Calc): 81 mg/dL (ref <130)
Total CHOL/HDL Ratio: 2.6 (calc) (ref <5.0)
Triglycerides: 81 mg/dL (ref <150)

## 2024-03-16 LAB — HEMOGLOBIN A1C
Hgb A1c MFr Bld: 6.5 % — ABNORMAL HIGH (ref <5.7)
Mean Plasma Glucose: 140 mg/dL
eAG (mmol/L): 7.7 mmol/L

## 2024-03-16 LAB — PSA: PSA: 1.56 ng/mL (ref <=4.00)

## 2024-03-16 LAB — TSH: TSH: 2.23 m[IU]/L (ref 0.40–4.50)

## 2024-03-17 ENCOUNTER — Ambulatory Visit: Payer: Self-pay | Admitting: Family Medicine

## 2024-03-21 ENCOUNTER — Encounter: Payer: Self-pay | Admitting: Family Medicine

## 2024-03-21 ENCOUNTER — Ambulatory Visit (INDEPENDENT_AMBULATORY_CARE_PROVIDER_SITE_OTHER): Admitting: Family Medicine

## 2024-03-21 VITALS — BP 132/70 | HR 55 | Ht 72.0 in | Wt 167.0 lb

## 2024-03-21 DIAGNOSIS — G250 Essential tremor: Secondary | ICD-10-CM | POA: Diagnosis not present

## 2024-03-21 DIAGNOSIS — H8110 Benign paroxysmal vertigo, unspecified ear: Secondary | ICD-10-CM | POA: Diagnosis not present

## 2024-03-21 DIAGNOSIS — Z0000 Encounter for general adult medical examination without abnormal findings: Secondary | ICD-10-CM

## 2024-03-21 DIAGNOSIS — R7303 Prediabetes: Secondary | ICD-10-CM | POA: Diagnosis not present

## 2024-03-21 DIAGNOSIS — I129 Hypertensive chronic kidney disease with stage 1 through stage 4 chronic kidney disease, or unspecified chronic kidney disease: Secondary | ICD-10-CM

## 2024-03-21 DIAGNOSIS — E7849 Other hyperlipidemia: Secondary | ICD-10-CM | POA: Diagnosis not present

## 2024-03-21 DIAGNOSIS — Z23 Encounter for immunization: Secondary | ICD-10-CM

## 2024-03-21 DIAGNOSIS — N182 Chronic kidney disease, stage 2 (mild): Secondary | ICD-10-CM

## 2024-03-21 MED ORDER — PROPRANOLOL HCL 20 MG PO TABS: 20.0000 mg | ORAL_TABLET | Freq: Two times a day (BID) | ORAL | 3 refills | Status: AC

## 2024-03-21 MED ORDER — ATORVASTATIN CALCIUM 10 MG PO TABS: 10.0000 mg | ORAL_TABLET | Freq: Every day | ORAL | 3 refills | Status: AC

## 2024-03-21 NOTE — Progress Notes (Signed)
 Subjective:    [name], male    DOB: 26-Nov-1950, 73 y.o.   MRN: [id number]  Jesse Figueroa is a 73 y.o. male presenting on 03/21/2024 for Annual Exam   HPI  Discussed the use of AI scribe software for clinical note transcription with the patient, who gave verbal consent to proceed.  History of Present Illness   Jesse Figueroa is a 73 year old male who presents for an annual physical exam.  Vertigo - Recent episode of vertigo with room spinning upon waking - Symptoms resolved after closing eyes and resting - No recurrence of vertigo since initial episode  Peripheral neuropathy symptoms - Occasional leg pain and numbness, particularly in the toes - Symptoms associated with prolonged sitting - Described as nerve-like symptoms, possibly related to positioning  Pre-Diabetes Improving sugar intake overall - Manages blood glucose through dietary modifications, avoiding excess sugar and carbohydrates, limiting ice cream nightly A1c 6.5%  S/p Cataract Surgery both eyes  Bilateral Trigger fingers, 4th ring fingers, but mild at this time. Not looking for intervention      CHRONIC HTN CKD-III Mild elevated Creatinine 1.15 now improved Reports doing well. He does report some elevated BP at times, usually in afternoon, ranging 130-140. Checks BP on wrist regularly. Current Meds - Losartan  100mg  daily, Propranolol  20mg  BID for essential tremor and HTN   Reports good compliance, took meds today. Tolerating well, w/o complaints. Lifestyle: - Diet: improving - Exercise: regular Admits rare headache Denies CP, dyspnea, edema, dizziness / lightheadedness   Kidney Cyst, non specific lesion - PMH previously had surveillance on MR 2 years in a row.    HYPERLIPIDEMIA: Lab results overall look good, controlled LDL 65 now improved - Currently taking Atorvastatin  10mg , tolerating well without side effects or myalgias Lifestyle - Diet: limited - Exercise:  improved activity exercise now   Left Plantar Fasciitis Followed by [facility] - Dr Lennie He has done evaluation with X-ray and Vascular ABI study, negative imaging Improved overall, has episodic flares Wears supports / inserts S/p injection by Podiatry improved Continues to follow with Podiatry, still has flare ups   Insomnia Failed Melatonin, Trazodone  He has difficulty sleep maintenance, wakes up 4-5am often, he prefers to avoid medication    Health Maintenance:   Last colonoscopy 05/13/21, repeat 5 years, 2027.  Prevnar-20 vaccine due, last done Pneumovax-23 2017.  Due COVID Booster 2025  Future consider Shingrix TDap vaccines at pharmacy   Flu Shot today   PSA 1.56 (2025), stable in past 2+ years     10/16/2023    8:12 AM 08/21/2023    9:47 AM 05/25/2023    2:41 PM  Depression screen PHQ 2/9  Decreased Interest 0 0 0  Down, Depressed, Hopeless 0 0 0  PHQ - 2 Score 0 0 0  Altered sleeping 1  2  Tired, decreased energy 0  0  Change in appetite 0  0  Feeling bad or failure about yourself  0  0  Trouble concentrating 0  0  Moving slowly or fidgety/restless 0  0  Suicidal thoughts 0  0  PHQ-9 Score 1  2  Difficult doing work/chores Not difficult at all         08/21/2023    9:47 AM 05/25/2023    2:41 PM 03/17/2023    8:08 AM 12/23/2022    1:20 PM  GAD 7 : Generalized Anxiety Score  Nervous, Anxious, on Edge 0 1 0  0  Control/stop worrying 0 1 0 0  Worry too much - different things 0 0 0 0  Trouble relaxing 0 0 0 0  Restless 0 0 0 0  Easily annoyed or irritable 0 0 0 0  Afraid - awful might happen 0 0 0 0  Total GAD 7 Score 0 2 0 0  Anxiety Difficulty   Not difficult at all Not difficult at all     Past Medical History:  Diagnosis Date   Age-related nuclear cataract of both eyes 05/11/2018   Arthritis of hand    Benign hypertension with CKD (chronic kidney disease), stage II    CKD (chronic kidney disease), stage II    Hyperlipidemia     Hypertension    Tremor    Past Surgical History:  Procedure Laterality Date   COLONOSCOPY N/A 05/13/2021   Procedure: COLONOSCOPY;  Surgeon: Maryruth Ole DASEN, MD;  Location: [facility] ENDOSCOPY;  Service: Endoscopy;  Laterality: N/A;   COLONOSCOPY WITH PROPOFOL  N/A 02/05/2016   Procedure: COLONOSCOPY WITH PROPOFOL ;  Surgeon: Gladis RAYMOND Mariner, MD;  Location: [facility];  Service: Endoscopy;  Laterality: N/A;   EYE SURGERY  Lasic   Social History   Socioeconomic History   Marital status: Married    Spouse name: Not on file   Number of children: 2   Years of education: Not on file   Highest education level: Bachelor's degree (e.g., BA, AB, BS)  Occupational History   Occupation: Hydrographic surveyor Track    Comment: Therapist, sports  Tobacco Use   Smoking status: Never   Smokeless tobacco: Never  Vaping Use   Vaping status: Never Used  Substance and Sexual Activity   Alcohol use: No   Drug use: No   Sexual activity: Yes  Other Topics Concern   Not on file  Social History Narrative   Track coach 5 days a week    Social Drivers of Health   Financial Resource Strain: Patient Declined (03/18/2024)   Overall Financial Resource Strain (CARDIA)    Difficulty of Paying Living Expenses: Patient declined  Food Insecurity: Patient Declined (03/18/2024)   Hunger Vital Sign    Worried About Running Out of Food in the Last Year: Patient declined    Ran Out of Food in the Last Year: Patient declined  Transportation Needs: No Transportation Needs (03/18/2024)   PRAPARE - Administrator, Civil Service (Medical): No    Lack of Transportation (Non-Medical): No  Physical Activity: Sufficiently Active (03/18/2024)   Exercise Vital Sign    Days of Exercise per Week: 4 days    Minutes of Exercise per Session: 50 min  Stress: No Stress Concern Present (03/18/2024)   [employer] of Occupational Health - Occupational Stress Questionnaire    Feeling of Stress: Not at all   Social Connections: Moderately Isolated (03/18/2024)   Social Connection and Isolation Panel    Frequency of Communication with Friends and Family: More than three times a week    Frequency of Social Gatherings with Friends and Family: Once a week    Attends Religious Services: Patient declined    Database administrator or Organizations: No    Attends Engineer, structural: Not on file    Marital Status: Married  Catering manager Violence: Not At Risk (10/16/2023)   Humiliation, Afraid, Rape, and Kick questionnaire    Fear of Current or Ex-Partner: No    Emotionally Abused: No    Physically Abused: No  Sexually Abused: No   Family History  Problem Relation Age of Onset   Heart disease Mother    Hypertension Mother    Diabetes Mother    Cancer Father    Colon cancer Son    Current Outpatient Medications on File Prior to Visit  Medication Sig   aspirin 81 MG chewable tablet Chew by mouth.   calcium  carbonate (TUMS - DOSED IN MG ELEMENTAL CALCIUM ) 500 MG chewable tablet Chew 1 tablet by mouth daily as needed for indigestion or heartburn.   fluticasone  (FLONASE ) 50 MCG/ACT nasal spray USE 2 SPRAYS IN BOTH NOSTRILS DAILY. USE FOR 4 TO 6 WEEKS THEN STOP AND USE SEASONALLY OR AS NEEDED.   losartan  (COZAAR ) 100 MG tablet Take 1 tablet (100 mg total) by mouth daily.   No current facility-administered medications on file prior to visit.    Review of Systems  Constitutional:  Negative for activity change, appetite change, chills, diaphoresis, fatigue and fever.  HENT:  Negative for congestion and hearing loss.   Eyes:  Negative for visual disturbance.  Respiratory:  Negative for cough, chest tightness, shortness of breath and wheezing.   Cardiovascular:  Negative for chest pain, palpitations and leg swelling.  Gastrointestinal:  Negative for abdominal pain, constipation, diarrhea, nausea and vomiting.  Genitourinary:  Negative for dysuria, frequency and hematuria.   Musculoskeletal:  Negative for arthralgias and neck pain.  Skin:  Negative for rash.  Neurological:  Negative for dizziness, weakness, light-headedness, numbness and headaches.  Hematological:  Negative for adenopathy.  Psychiatric/Behavioral:  Negative for behavioral problems, dysphoric mood and sleep disturbance.    Per HPI unless specifically indicated above     Objective:    BP 132/70 (BP Location: Left Arm, Patient Position: Sitting, Cuff Size: Normal)   Pulse (!) 55   Ht 6' (1.829 m)   Wt 167 lb (75.8 kg)   SpO2 99%   BMI 22.65 kg/m   Wt Readings from Last 3 Encounters:  03/21/24 167 lb (75.8 kg)  01/07/24 164 lb 2 oz (74.4 kg)  11/06/23 167 lb 9.6 oz (76 kg)    Physical Exam Vitals and nursing note reviewed.  Constitutional:      General: He is not in acute distress.    Appearance: He is well-developed. He is not diaphoretic.     Comments: Well-appearing, comfortable, cooperative  HENT:     Head: Normocephalic and atraumatic.  Eyes:     General:        Right eye: No discharge.        Left eye: No discharge.     Conjunctiva/sclera: Conjunctivae normal.     Pupils: Pupils are equal, round, and reactive to light.  Neck:     Thyroid : No thyromegaly.  Cardiovascular:     Rate and Rhythm: Normal rate and regular rhythm.     Pulses: Normal pulses.     Heart sounds: Normal heart sounds. No murmur heard. Pulmonary:     Effort: Pulmonary effort is normal. No respiratory distress.     Breath sounds: Normal breath sounds. No wheezing or rales.  Abdominal:     General: Bowel sounds are normal. There is no distension.     Palpations: Abdomen is soft. There is no mass.     Tenderness: There is no abdominal tenderness.  Musculoskeletal:        General: No tenderness. Normal range of motion.     Cervical back: Normal range of motion and neck supple.  Comments: Upper / Lower Extremities: - Normal muscle tone, strength bilateral upper extremities 5/5, lower extremities  5/5  Lymphadenopathy:     Cervical: No cervical adenopathy.  Skin:    General: Skin is warm and dry.     Findings: No erythema or rash.  Neurological:     Mental Status: He is alert and oriented to person, place, and time.     Comments: Distal sensation intact to light touch all extremities  Psychiatric:        Mood and Affect: Mood normal.        Behavior: Behavior normal.        Thought Content: Thought content normal.     Comments: Well groomed, good eye contact, normal speech and thoughts     Results for orders placed or performed in visit on 03/15/24  Comprehensive metabolic panel with GFR   Collection Time: 03/15/24  9:12 AM  Result Value Ref Range   Glucose, Bld 97 65 - 99 mg/dL   BUN 18 7 - 25 mg/dL   Creat 8.84 9.29 - 8.71 mg/dL   eGFR 66 > OR = 60 fO/fpw/8.26f7   BUN/Creatinine Ratio SEE NOTE: 6 - 22 (calc)   Sodium 140 135 - 146 mmol/L   Potassium 4.2 3.5 - 5.3 mmol/L   Chloride 104 98 - 110 mmol/L   CO2 30 20 - 32 mmol/L   Calcium  9.3 8.6 - 10.3 mg/dL   Total Protein 6.6 6.1 - 8.1 g/dL   Albumin 4.0 3.6 - 5.1 g/dL   Globulin 2.6 1.9 - 3.7 g/dL (calc)   AG Ratio 1.5 1.0 - 2.5 (calc)   Total Bilirubin 0.7 0.2 - 1.2 mg/dL   Alkaline phosphatase (APISO) 92 35 - 144 U/L   AST 14 10 - 35 U/L   ALT 16 9 - 46 U/L  TSH   Collection Time: 03/15/24  9:12 AM  Result Value Ref Range   TSH 2.23 0.40 - 4.50 mIU/L  Microalbumin / creatinine urine ratio   Collection Time: 03/15/24  9:12 AM  Result Value Ref Range   Creatinine, Urine 168 20 - 320 mg/dL   Microalb, Ur 0.6 mg/dL   Microalb Creat Ratio 4 <30 mg/g creat  PSA   Collection Time: 03/15/24  9:12 AM  Result Value Ref Range   PSA 1.56 < OR = 4.00 ng/mL  CBC with Differential/Platelet   Collection Time: 03/15/24  9:12 AM  Result Value Ref Range   WBC 5.3 3.8 - 10.8 Thousand/uL   RBC 5.09 4.20 - 5.80 Million/uL   Hemoglobin 13.9 13.2 - 17.1 g/dL   HCT 55.6 61.4 - 49.9 %   MCV 87.0 80.0 - 100.0 fL   MCH 27.3  27.0 - 33.0 pg   MCHC 31.4 (L) 32.0 - 36.0 g/dL   RDW 86.6 88.9 - 84.9 %   Platelets 275 140 - 400 Thousand/uL   MPV 10.9 7.5 - 12.5 fL   Neutro Abs 2,952 1,500 - 7,800 cells/uL   Absolute Lymphocytes 1,632 850 - 3,900 cells/uL   Absolute Monocytes 572 200 - 950 cells/uL   Eosinophils Absolute 111 15 - 500 cells/uL   Basophils Absolute 32 0 - 200 cells/uL   Neutrophils Relative % 55.7 %   Total Lymphocyte 30.8 %   Monocytes Relative 10.8 %   Eosinophils Relative 2.1 %   Basophils Relative 0.6 %  Hemoglobin A1c   Collection Time: 03/15/24  9:12 AM  Result Value Ref Range   Hgb  A1c MFr Bld 6.5 (H) <5.7 %   Mean Plasma Glucose 140 mg/dL   eAG (mmol/L) 7.7 mmol/L  Lipid panel   Collection Time: 03/15/24  9:12 AM  Result Value Ref Range   Cholesterol 133 <200 mg/dL   HDL 52 > OR = 40 mg/dL   Triglycerides 81 <849 mg/dL   LDL Cholesterol (Calc) 65 mg/dL (calc)   Total CHOL/HDL Ratio 2.6 <5.0 (calc)   Non-HDL Cholesterol (Calc) 81 <869 mg/dL (calc)      Assessment & Plan:   Problem List Items Addressed This Visit     Essential tremor   Relevant Medications   propranolol  (INDERAL ) 20 MG tablet   Hyperlipidemia   Relevant Medications   atorvastatin  (LIPITOR) 10 MG tablet   propranolol  (INDERAL ) 20 MG tablet   Other Relevant Orders   CT CARDIAC SCORING (SELF PAY ONLY)   Pre-diabetes   Other Visit Diagnoses       Annual physical exam    -  Primary     Flu vaccine need       Relevant Orders   Flu vaccine HIGH DOSE PF(Fluzone Trivalent) (Completed)     Benign hypertension with CKD (chronic kidney disease), stage II       Relevant Medications   atorvastatin  (LIPITOR) 10 MG tablet   propranolol  (INDERAL ) 20 MG tablet   Other Relevant Orders   CT CARDIAC SCORING (SELF PAY ONLY)     Benign paroxysmal positional vertigo, unspecified laterality            Updated Health Maintenance information Reviewed recent lab results with patient Encouraged improvement to lifestyle  with diet and exercise Goal of weight loss  Adult Wellness Visit Routine wellness visit with comprehensive review. Labs normal. Blood pressure managed with losartan . Overall good health. - Continue losartan , cholesterol medication, propranolol . - Schedule follow-up in six months for routine check-up and A1c monitoring.  Prediabetes, diet-controlled A1c at 6.5, diet-controlled. No medication needed if stable or improved. - Continue dietary management. - Monitor A1c in six months.  Hyperlipidemia Cholesterol well-controlled. LDL at 65, total cholesterol in 130s. - Continue current cholesterol medication. Atorvastatin  10mg  - Refill cholesterol medication for 90 days.  Hypertension Blood pressure well-controlled with losartan . - Continue losartan  100mg  - Refill losartan  for 90 days.  Bilateral trigger fingers (4th ring finger) Symptoms of locking in ring fingers. No surgical intervention needed unless worsens. - Monitor symptoms, avoid exacerbating activities.  Episodic left leg paresthesia Likely due to positional nerve compression, resolves with position change. - Avoid positions that exacerbate symptoms.  Benign paroxysmal positional vertigo (suspected) Single episode resolved spontaneously. Home exercises discussed. - Perform home exercises if vertigo recurs. Epley Maneuver   Immunizations Influenza vaccine given. COVID-19 booster, shingles, and pneumococcal vaccines discussed. COVID-19 booster recommended. Pneumococcal vaccine recommended due to new version. - Administer influenza vaccine. - Recommend COVID-19 booster at pharmacy. - Schedule pneumococcal vaccine (Prevnar 20) for March 30, 2022. Nurse Visit - Consider shingles vaccine in future at pharmacy.  General Health Maintenance Discussed health maintenance, vaccinations, screenings. Colon screening due 2027. Heart scan discussed for coronary artery disease prevention. - Schedule heart scan for coronary artery  screening. - Plan colon screening for 2027.           Orders Placed This Encounter  Procedures   CT CARDIAC SCORING (SELF PAY ONLY)    Standing Status:   Future    Expiration Date:   03/21/2025    Preferred imaging location?:   [facility]  Flu vaccine HIGH DOSE PF(Fluzone Trivalent)    Meds ordered this encounter  Medications   atorvastatin  (LIPITOR) 10 MG tablet    Sig: Take 1 tablet (10 mg total) by mouth daily.    Dispense:  90 tablet    Refill:  3    Add future refills   propranolol  (INDERAL ) 20 MG tablet    Sig: Take 1 tablet (20 mg total) by mouth 2 (two) times daily.    Dispense:  180 tablet    Refill:  3    Add refills for future     Follow up plan: Return for 6 month PreDM A1c.  Marsa Officer, DO [facility] 03/21/2024, 9:32 AM

## 2024-03-21 NOTE — Patient Instructions (Addendum)
 Thank you for coming to the office today.  Flu Shot today  Oceanographer at the pharmacy  Future Shingles vaccine 2 doses at the pharmacy.  Prevnar-20  Recent Labs    09/15/23 0808 03/15/24 0912  HGBA1C 6.6* 6.5*   Improved A1c sugar down to 6.5 Goal to keep improving avoiding excess carb starch sugar to prevent diabetes.   You have symptoms of Vertigo (Benign Paroxysmal Positional Vertigo) - This is commonly caused by inner ear fluid imbalance, sometimes can be worsened by allergies and sinus symptoms, otherwise it can occur randomly sometimes and we may never discover the exact cause. - To treat this, try the Epley Manuever (see diagrams/instructions below) at home up to 3 times a day for 1-2 weeks or until symptoms resolve     ----------------------------------------------------------------------------------------------------------------------        You have been referred for a Coronary Calcium  Score Cardiac CT Scan. This is a screening test for patients aged 18-50+ with cardiovascular risk factors or who are healthy but would be interested in Cardiovascular Screening for heart disease. Even if there is a family history of heart disease, this imaging can be useful. Typically it can be done every 5+ years or at a different timeline we agree on  The scan will look at the chest and mainly focus on the heart and identify early signs of calcium  build up or blockages within the heart arteries. It is not 100% accurate for identifying blockages or heart disease, but it is useful to help us  predict who may have some early changes or be at risk in the future for a heart attack or cardiovascular problem.  The results are reviewed by a Cardiologist and they will document the results. It should become available on MyChart. Typically the results are divided into percentiles based on other patients of the same demographic and age. So it will compare your risk to others similar to  you. If you have a higher score >99 or higher percentile >75%tile, it is recommended to consider Statin cholesterol therapy and or referral to Cardiologist. I will try to help explain your results and if we have questions we can contact the Cardiologist.  You will be contacted for scheduling. Usually it is done at any imaging facility through [facility], [facility] or [facility].  The cost is $99 flat fee total and it does not go through insurance, so no authorization is required.   Please schedule a Follow-up Appointment to: Return for 6 month PreDM A1c.  If you have any other questions or concerns, please feel free to call the office or send a message through MyChart. You may also schedule an earlier appointment if necessary.  Additionally, you may be receiving a survey about your experience at our office within a few days to 1 week by e-mail or mail. We value your feedback.  Marsa Officer, DO [facility], [place]

## 2024-03-28 ENCOUNTER — Ambulatory Visit
Admission: RE | Admit: 2024-03-28 | Discharge: 2024-03-28 | Disposition: A | Discharge status: Home / Self Care | Payer: Self-pay | Source: Ambulatory Visit | Attending: Family Medicine | Admitting: Family Medicine

## 2024-03-28 DIAGNOSIS — I129 Hypertensive chronic kidney disease with stage 1 through stage 4 chronic kidney disease, or unspecified chronic kidney disease: Secondary | ICD-10-CM | POA: Insufficient documentation

## 2024-03-28 DIAGNOSIS — N182 Chronic kidney disease, stage 2 (mild): Secondary | ICD-10-CM | POA: Insufficient documentation

## 2024-03-28 DIAGNOSIS — E7849 Other hyperlipidemia: Secondary | ICD-10-CM | POA: Insufficient documentation

## 2024-03-29 ENCOUNTER — Ambulatory Visit: Payer: Self-pay | Admitting: Family Medicine

## 2024-03-30 ENCOUNTER — Ambulatory Visit (INDEPENDENT_AMBULATORY_CARE_PROVIDER_SITE_OTHER)

## 2024-03-30 ENCOUNTER — Other Ambulatory Visit: Payer: Self-pay | Admitting: Family Medicine

## 2024-03-30 DIAGNOSIS — Z23 Encounter for immunization: Secondary | ICD-10-CM

## 2024-03-30 DIAGNOSIS — I10 Essential (primary) hypertension: Secondary | ICD-10-CM

## 2024-03-31 NOTE — Telephone Encounter (Signed)
 Requested by interface surescripts. Future visit 09/19/24. Requested Prescriptions  Pending Prescriptions Disp Refills   losartan  (COZAAR ) 100 MG tablet [Pharmacy Med Name: LOSARTAN  POTASSIUM 100 MG TAB] 90 tablet 2    Sig: TAKE 1 TABLET BY MOUTH EVERY DAY     Cardiovascular:  Angiotensin Receptor Blockers Passed - 03/31/2024  3:47 PM      Passed - Cr in normal range and within 180 days    Creat  Date Value Ref Range Status  03/15/2024 1.15 0.70 - 1.28 mg/dL Final   Creatinine, Urine  Date Value Ref Range Status  03/15/2024 168 20 - 320 mg/dL Final         Passed - K in normal range and within 180 days    Potassium  Date Value Ref Range Status  03/15/2024 4.2 3.5 - 5.3 mmol/L Final         Passed - Patient is not pregnant      Passed - Last BP in normal range    BP Readings from Last 1 Encounters:  03/21/24 132/70         Passed - Valid encounter within last 6 months    Recent Outpatient Visits           1 week ago Annual physical exam   [facility] [place], Marsa PARAS, DO   4 months ago Dermoid cyst of upper extremity, left   [place] [place], Angeline ORN, NP   6 months ago Pre-diabetes   [facility] [place] Marsa PARAS, DO   7 months ago Functional abdominal pain syndrome   [facility] [place], Marsa PARAS, [place]

## 2024-04-04 ENCOUNTER — Ambulatory Visit: Payer: Self-pay

## 2024-04-04 DIAGNOSIS — M722 Plantar fascial fibromatosis: Secondary | ICD-10-CM

## 2024-04-04 NOTE — Telephone Encounter (Signed)
 FYI Only or Action Required?: Action required by provider: request for documentation or forms. Requesting a handicap placard  Patient was last seen in primary care on 03/21/2024 by Jesse Figueroa PARAS, DO.  Called Nurse Triage reporting Pain.  Symptoms began several months ago.  Interventions attempted: Prescription medications: Steroid injections.  Symptoms are: gradually worsening.  Triage Disposition: See PCP When Office is Open (Within 3 Days)  Patient/caregiver understands and will follow disposition?: Yes  Copied from CRM #[id number]. Topic: Clinical - Red Word Triage >> Apr 04, 2024 10:28 AM Antwanette L wrote: Red Word that prompted transfer to Nurse Triage: The patient is experiencing severe, constant pain due to plantar fasciitis and is requesting assistance in obtaining a handicap parking placard.  Reason for Disposition  [1] MODERATE pain (e.g., interferes with normal activities, limping) AND [2] present > 3 days  Answer Assessment - Initial Assessment Questions Has had 2 injections, did not help very long. Most recent injection was August. Went to a football game recently and needed assistance getting to the car. Inquiring about getting a handicap placard. Advised to check [place] DMV website to check for forms pt may need to fill out and bring to appt for MD to sign. Appt scheduled.  1. ONSET: When did the pain start?      Over 1 year ago.  2. LOCATION: Where is the pain located?      Left foot  3. PAIN: How bad is the pain?    (Scale 1-10; or mild, moderate, severe)     Moderate-severe.  4. WORK OR EXERCISE: Has there been any recent work or exercise that involved this part of the body?      Went to a football game recently  5. CAUSE: What do you think is causing the foot pain?     Plantar Fascitis  6. OTHER SYMPTOMS: Do you have any other symptoms? (e.g., leg pain, rash, fever, numbness)     No  Protocols used: Foot Pain-A-AH

## 2024-04-04 NOTE — Telephone Encounter (Signed)
 I called patient.  He says he only scheduled apt to get handicap placard, since this is a problem that I am aware of and it is not new, I am willing to just write his handicap placard and he can pick it up later today. I will cancel apt for 10/23  Marsa Officer, DO [facility] 04/04/2024, 11:46 AM

## 2024-04-07 ENCOUNTER — Ambulatory Visit: Admitting: Family Medicine

## 2024-06-06 ENCOUNTER — Telehealth: Payer: Self-pay

## 2024-06-06 NOTE — Telephone Encounter (Signed)
 Copied from CRM #[id number]. Topic: Appointments - Scheduling Inquiry for Clinic >> Jun 06, 2024  8:49 AM Tobias CROME wrote: Reason for CRM: Patient has been taking BP at home. Patient states the machine has stated his BP is consistently too high.   Readings have been 166/83 to 147/? & 160/?  Patient requesting appointment to have BP checked to make sure it is not too high. Best callback number: [phone]

## 2024-06-06 NOTE — Telephone Encounter (Signed)
 Spoke with patient, scheduled an appointment for 06/07/24

## 2024-06-07 ENCOUNTER — Ambulatory Visit: Admitting: Family Medicine

## 2024-06-07 VITALS — BP 138/90 | HR 52 | Ht 72.0 in | Wt 177.0 lb

## 2024-06-07 DIAGNOSIS — I10 Essential (primary) hypertension: Secondary | ICD-10-CM

## 2024-06-07 MED ORDER — AMLODIPINE BESYLATE 5 MG PO TABS: 5.0000 mg | ORAL_TABLET | Freq: Every day | ORAL | 1 refills | Status: AC

## 2024-06-07 NOTE — Progress Notes (Signed)
 "  Subjective:    [name], male    DOB: [birth year], 73 y.o.   MRN: [id number]  Jesse Figueroa is a 73 y.o. male presenting on 06/07/2024 for Medical Management of Chronic Issues  HPI  Discussed the use of AI scribe software for clinical note transcription with the patient, who gave verbal consent to proceed.  History of Present Illness   Jesse Figueroa is a 73 year old male with hypertension who presents for a blood pressure recheck.  Hypertension Elevated blood pressure - Home blood pressure readings consistently higher than clinic measurements. - Recent home readings: 154/83, 156/83, 147/81, 152/83, and 172/97. Upper arm eletronic Equate cuff - Clinic blood pressure today: 142/86. Manual reading - Concern regarding recent rise in blood pressure readings. - No recent changes in pain, stress, or other life factors that could contribute to elevated blood pressure. - Unsure of any dietary changes affecting blood pressure.  Antihypertensive medication history - Currently taking losartan  for blood pressure management. - Previously took hydrochlorothiazide  approximately ten years ago for fluid management. - Previously took a beta blocker for tremor, but no longer on this medication. - Not currently taking amlodipine .          06/07/2024   11:20 AM 10/16/2023    8:12 AM 08/21/2023    9:47 AM  Depression screen PHQ 2/9  Decreased Interest 0 0 0  Down, Depressed, Hopeless 0 0 0  PHQ - 2 Score 0 0 0  Altered sleeping  1   Tired, decreased energy  0   Change in appetite  0   Feeling bad or failure about yourself   0   Trouble concentrating  0   Moving slowly or fidgety/restless  0   Suicidal thoughts  0   PHQ-9 Score  1    Difficult doing work/chores  Not difficult at all      Data saved with a previous flowsheet row definition       06/07/2024   11:20 AM 08/21/2023    9:47 AM 05/25/2023    2:41 PM 03/17/2023    8:08 AM  GAD 7 : Generalized  Anxiety Score  Nervous, Anxious, on Edge 0 0 1 0  Control/stop worrying 0 0 1 0  Worry too much - different things 0 0 0 0  Trouble relaxing 0 0 0 0  Restless 0 0 0 0  Easily annoyed or irritable 0 0 0 0  Afraid - awful might happen 0 0 0 0  Total GAD 7 Score 0 0 2 0  Anxiety Difficulty    Not difficult at all    Social History[1]  Review of Systems Per HPI unless specifically indicated above     Objective:    BP (!) 138/90 (BP Location: Left Arm, Cuff Size: Normal)   Pulse (!) 52   Ht 6' (1.829 m)   Wt 177 lb (80.3 kg)   SpO2 99%   BMI 24.01 kg/m   Wt Readings from Last 3 Encounters:  06/07/24 177 lb (80.3 kg)  03/21/24 167 lb (75.8 kg)  01/07/24 164 lb 2 oz (74.4 kg)    Physical Exam Vitals and nursing note reviewed.  Constitutional:      General: He is not in acute distress.    Appearance: Normal appearance. He is well-developed. He is not diaphoretic.     Comments: Well-appearing, comfortable, cooperative  HENT:     Head: Normocephalic and atraumatic.  Eyes:  General:        Right eye: No discharge.        Left eye: No discharge.     Conjunctiva/sclera: Conjunctivae normal.  Cardiovascular:     Rate and Rhythm: Normal rate.  Pulmonary:     Effort: Pulmonary effort is normal.  Musculoskeletal:     Right lower leg: No edema.     Left lower leg: No edema.  Skin:    General: Skin is warm and dry.     Findings: No erythema or rash.  Neurological:     Mental Status: He is alert and oriented to person, place, and time.  Psychiatric:        Mood and Affect: Mood normal.        Behavior: Behavior normal.        Thought Content: Thought content normal.     Comments: Well groomed, good eye contact, normal speech and thoughts     Results for orders placed or performed in visit on 03/15/24  Comprehensive metabolic panel with GFR   Collection Time: 03/15/24  9:12 AM  Result Value Ref Range   Glucose, Bld 97 65 - 99 mg/dL   BUN 18 7 - 25 mg/dL   Creat  8.84 9.29 - 8.71 mg/dL   eGFR 66 > OR = 60 fO/fpw/8.26f7   BUN/Creatinine Ratio SEE NOTE: 6 - 22 (calc)   Sodium 140 135 - 146 mmol/L   Potassium 4.2 3.5 - 5.3 mmol/L   Chloride 104 98 - 110 mmol/L   CO2 30 20 - 32 mmol/L   Calcium  9.3 8.6 - 10.3 mg/dL   Total Protein 6.6 6.1 - 8.1 g/dL   Albumin 4.0 3.6 - 5.1 g/dL   Globulin 2.6 1.9 - 3.7 g/dL (calc)   AG Ratio 1.5 1.0 - 2.5 (calc)   Total Bilirubin 0.7 0.2 - 1.2 mg/dL   Alkaline phosphatase (APISO) 92 35 - 144 U/L   AST 14 10 - 35 U/L   ALT 16 9 - 46 U/L  TSH   Collection Time: 03/15/24  9:12 AM  Result Value Ref Range   TSH 2.23 0.40 - 4.50 mIU/L  Microalbumin / creatinine urine ratio   Collection Time: 03/15/24  9:12 AM  Result Value Ref Range   Creatinine, Urine 168 20 - 320 mg/dL   Microalb, Ur 0.6 mg/dL   Microalb Creat Ratio 4 <30 mg/g creat  PSA   Collection Time: 03/15/24  9:12 AM  Result Value Ref Range   PSA 1.56 < OR = 4.00 ng/mL  CBC with Differential/Platelet   Collection Time: 03/15/24  9:12 AM  Result Value Ref Range   WBC 5.3 3.8 - 10.8 Thousand/uL   RBC 5.09 4.20 - 5.80 Million/uL   Hemoglobin 13.9 13.2 - 17.1 g/dL   HCT 55.6 61.4 - 49.9 %   MCV 87.0 80.0 - 100.0 fL   MCH 27.3 27.0 - 33.0 pg   MCHC 31.4 (L) 32.0 - 36.0 g/dL   RDW 86.6 88.9 - 84.9 %   Platelets 275 140 - 400 Thousand/uL   MPV 10.9 7.5 - 12.5 fL   Neutro Abs 2,952 1,500 - 7,800 cells/uL   Absolute Lymphocytes 1,632 850 - 3,900 cells/uL   Absolute Monocytes 572 200 - 950 cells/uL   Eosinophils Absolute 111 15 - 500 cells/uL   Basophils Absolute 32 0 - 200 cells/uL   Neutrophils Relative % 55.7 %   Total Lymphocyte 30.8 %   Monocytes Relative 10.8 %  Eosinophils Relative 2.1 %   Basophils Relative 0.6 %  Hemoglobin A1c   Collection Time: 03/15/24  9:12 AM  Result Value Ref Range   Hgb A1c MFr Bld 6.5 (H) <5.7 %   Mean Plasma Glucose 140 mg/dL   eAG (mmol/L) 7.7 mmol/L  Lipid panel   Collection Time: 03/15/24  9:12 AM   Result Value Ref Range   Cholesterol 133 <200 mg/dL   HDL 52 > OR = 40 mg/dL   Triglycerides 81 <849 mg/dL   LDL Cholesterol (Calc) 65 mg/dL (calc)   Total CHOL/HDL Ratio 2.6 <5.0 (calc)   Non-HDL Cholesterol (Calc) 81 <869 mg/dL (calc)      Assessment & Plan:   Problem List Items Addressed This Visit   None Visit Diagnoses       Essential hypertension    -  Primary   Relevant Medications   amLODipine  (NORVASC ) 5 MG tablet        Essential hypertension Home blood pressure readings elevated question accuracy of BP cuff calibrated today with still high readings on his cuff; in-office readings lower. Losartan  currently used.  Past history on hydrochlorothiazide  2016 but has been off of this. Never on CCB. Amlodipine  considered to improve control. Discussed side effects and benefits. - Prescribed amlodipine  5 mg daily. Add on therapy - Continue Losartan  100mg  daily - Advised home blood pressure monitoring. - Instructed to elevate legs if fluid retention occurs. - Scheduled follow-up with nurse for manual blood pressure check if home readings remain high. - Scheduled follow-up appointment in April.        No orders of the defined types were placed in this encounter.   Meds ordered this encounter  Medications   amLODipine  (NORVASC ) 5 MG tablet    Sig: Take 1 tablet (5 mg total) by mouth daily.    Dispense:  90 tablet    Refill:  1    Follow up plan: Return if symptoms worsen or fail to improve.  Marsa Officer, DO [facility] 06/07/2024, 11:43 AM     [1]  Social History Tobacco Use   Smoking status: Never   Smokeless tobacco: Never  Vaping Use   Vaping status: Never Used  Substance Use Topics   Alcohol use: No   Drug use: No   "

## 2024-06-07 NOTE — Patient Instructions (Addendum)
 Thank you for coming to the office today.  Elevated BP still  Start new rx Amlodipine  5mg  daily  Caution mild sedation.  May need a BP check nurse visit  Please schedule a Follow-up Appointment to: Return if symptoms worsen or fail to improve.  If you have any other questions or concerns, please feel free to call the office or send a message through MyChart. You may also schedule an earlier appointment if necessary.  Additionally, you may be receiving a survey about your experience at our office within a few days to 1 week by e-mail or mail. We value your feedback.  Marsa Officer, DO [facility], [place]

## 2024-07-08 ENCOUNTER — Telehealth: Payer: Self-pay

## 2024-07-08 NOTE — Telephone Encounter (Signed)
 Copied from CRM [id number]. Topic: General - Other >> Jul 08, 2024  1:28 PM Victoria B wrote: Reason for CRM: Melissa from [facility] [employer] states faxed in info for patient about a hypertension, assesment program on last week. Pleae cb if received

## 2024-07-08 NOTE — Telephone Encounter (Signed)
 Information received

## 2024-07-20 NOTE — Progress Notes (Signed)
 ANDONI BUSCH                                          MRN: [id number]   07/20/2024   The [facility] Quality Team Specialist reviewed this patient medical record for the purposes of chart review for care gap closure. The following were reviewed: chart review for care gap closure-controlling blood pressure.    [facility] Quality Team

## 2024-09-19 ENCOUNTER — Ambulatory Visit: Admitting: Family Medicine

## 2024-10-21 ENCOUNTER — Ambulatory Visit

## 2024-10-26 ENCOUNTER — Ambulatory Visit
